# Patient Record
Sex: Male | Born: 1935 | Race: White | Hispanic: No | Marital: Married | Smoking: Never smoker
Health system: Southern US, Community
[De-identification: ages and names within clinical notes are randomized; demographics above are authoritative.]

## PROBLEM LIST (undated history)

## (undated) DIAGNOSIS — E7800 Pure hypercholesterolemia, unspecified: Secondary | ICD-10-CM

## (undated) DIAGNOSIS — R739 Hyperglycemia, unspecified: Secondary | ICD-10-CM

## (undated) DIAGNOSIS — C801 Malignant (primary) neoplasm, unspecified: Secondary | ICD-10-CM

## (undated) DIAGNOSIS — I480 Paroxysmal atrial fibrillation: Secondary | ICD-10-CM

## (undated) DIAGNOSIS — Z950 Presence of cardiac pacemaker: Secondary | ICD-10-CM

## (undated) DIAGNOSIS — I10 Essential (primary) hypertension: Secondary | ICD-10-CM

## (undated) DIAGNOSIS — I442 Atrioventricular block, complete: Secondary | ICD-10-CM

## (undated) DIAGNOSIS — H9190 Unspecified hearing loss, unspecified ear: Secondary | ICD-10-CM

## (undated) DIAGNOSIS — I2584 Coronary atherosclerosis due to calcified coronary lesion: Secondary | ICD-10-CM

## (undated) DIAGNOSIS — I2510 Atherosclerotic heart disease of native coronary artery without angina pectoris: Secondary | ICD-10-CM

## (undated) HISTORY — PX: INGUINAL HERNIA REPAIR: SHX194

## (undated) HISTORY — DX: Paroxysmal atrial fibrillation: I48.0

## (undated) HISTORY — PX: PACEMAKER INSERTION: SHX728

---

## 2004-01-01 ENCOUNTER — Ambulatory Visit (HOSPITAL_COMMUNITY): Admission: RE | Admit: 2004-01-01 | Discharge: 2004-01-01 | Payer: Self-pay | Admitting: Cardiovascular Disease

## 2011-09-09 ENCOUNTER — Other Ambulatory Visit: Payer: Self-pay | Admitting: Cardiovascular Disease

## 2011-09-09 ENCOUNTER — Encounter (HOSPITAL_COMMUNITY): Payer: Self-pay | Admitting: Pharmacy Technician

## 2011-09-16 ENCOUNTER — Other Ambulatory Visit: Payer: Self-pay | Admitting: Cardiovascular Disease

## 2011-09-19 ENCOUNTER — Ambulatory Visit (HOSPITAL_COMMUNITY)
Admission: RE | Admit: 2011-09-19 | Discharge: 2011-09-19 | Disposition: A | Discharge status: Home / Self Care | Payer: Medicare Other | Source: Ambulatory Visit | Attending: Cardiovascular Disease | Admitting: Cardiovascular Disease

## 2011-09-19 ENCOUNTER — Ambulatory Visit (HOSPITAL_COMMUNITY): Payer: Medicare Other

## 2011-09-19 ENCOUNTER — Encounter (HOSPITAL_COMMUNITY)
Admission: RE | Disposition: A | Discharge status: Home / Self Care | Payer: Self-pay | Source: Ambulatory Visit | Attending: Cardiovascular Disease

## 2011-09-19 DIAGNOSIS — Z45018 Encounter for adjustment and management of other part of cardiac pacemaker: Secondary | ICD-10-CM | POA: Insufficient documentation

## 2011-09-19 DIAGNOSIS — Z45010 Encounter for checking and testing of cardiac pacemaker pulse generator [battery]: Secondary | ICD-10-CM

## 2011-09-19 DIAGNOSIS — I442 Atrioventricular block, complete: Secondary | ICD-10-CM

## 2011-09-19 HISTORY — PX: PACEMAKER GENERATOR CHANGE: SHX5481

## 2011-09-19 HISTORY — DX: Atrioventricular block, complete: I44.2

## 2011-09-19 LAB — GLUCOSE, CAPILLARY
Glucose-Capillary: 122 mg/dL — ABNORMAL HIGH (ref 70–99)
Glucose-Capillary: 108 mg/dL — ABNORMAL HIGH (ref 70–99)

## 2011-09-19 SURGERY — PACEMAKER GENERATOR CHANGE
Anesthesia: LOCAL

## 2011-09-19 MED ORDER — SODIUM CHLORIDE 0.9 % IV SOLN: 250.0000 mL | INTRAVENOUS | Status: DC | PRN

## 2011-09-19 MED ORDER — ONDANSETRON HCL 4 MG/2ML IJ SOLN: 4.0000 mg | Freq: Four times a day (QID) | INTRAMUSCULAR | Status: DC | PRN

## 2011-09-19 MED ORDER — CHLORHEXIDINE GLUCONATE 4 % EX LIQD: 60.0000 mL | Freq: Once | CUTANEOUS | Status: DC

## 2011-09-19 MED ORDER — SODIUM CHLORIDE 0.9 % IR SOLN: 80.0000 mg | Status: DC

## 2011-09-19 MED ORDER — ACETAMINOPHEN 325 MG PO TABS: 325.0000 mg | ORAL_TABLET | ORAL | Status: DC | PRN

## 2011-09-19 MED ORDER — CEFAZOLIN SODIUM 1-5 GM-% IV SOLN
INTRAVENOUS | Status: AC
  Filled 2011-09-19: qty 50

## 2011-09-19 MED ORDER — SODIUM CHLORIDE 0.9 % IJ SOLN: 3.0000 mL | INTRAMUSCULAR | Status: DC | PRN

## 2011-09-19 MED ORDER — ACETAMINOPHEN 500 MG PO TABS: 1000.0000 mg | ORAL_TABLET | Freq: Four times a day (QID) | ORAL | Status: DC

## 2011-09-19 MED ORDER — MUPIROCIN 2 % EX OINT
TOPICAL_OINTMENT | Freq: Two times a day (BID) | CUTANEOUS | Status: DC
  Administered 2011-09-19: 1 via NASAL

## 2011-09-19 MED ORDER — HEPARIN (PORCINE) IN NACL 2-0.9 UNIT/ML-% IJ SOLN
INTRAMUSCULAR | Status: AC
  Filled 2011-09-19: qty 1000

## 2011-09-19 MED ORDER — FENTANYL CITRATE 0.05 MG/ML IJ SOLN
INTRAMUSCULAR | Status: AC
  Filled 2011-09-19: qty 2

## 2011-09-19 MED ORDER — LIDOCAINE HCL (PF) 1 % IJ SOLN
INTRAMUSCULAR | Status: AC
  Filled 2011-09-19: qty 60

## 2011-09-19 MED ORDER — MUPIROCIN 2 % EX OINT
TOPICAL_OINTMENT | CUTANEOUS | Status: AC
  Administered 2011-09-19: 1 via NASAL
  Filled 2011-09-19: qty 22

## 2011-09-19 MED ORDER — SODIUM CHLORIDE 0.9 % IJ SOLN: 3.0000 mL | Freq: Two times a day (BID) | INTRAMUSCULAR | Status: DC

## 2011-09-19 MED ORDER — MIDAZOLAM HCL 2 MG/2ML IJ SOLN
INTRAMUSCULAR | Status: AC
  Filled 2011-09-19: qty 2

## 2011-09-19 MED ORDER — SODIUM CHLORIDE 0.9 % IV SOLN: INTRAVENOUS | Status: DC

## 2011-09-19 MED ORDER — SODIUM CHLORIDE 0.9 % IV SOLN
INTRAVENOUS | Status: DC
  Administered 2011-09-19: 08:00:00 via INTRAVENOUS

## 2011-09-19 NOTE — Progress Notes (Signed)
Pt admitted to C1. Left chest dressing CDI. Bedrest/ activity order explained to pt. Pt eating a sandwich and drink now. Family at bedside.

## 2011-09-19 NOTE — Procedures (Signed)
Procedure report  Procedure performed:  1. Dual chamber pacemaker generator changeout  2. Light sedation  Reason for procedure:  1. Device generator at elective replacement interval  Procedure performed by:  Thurmon Fair, MD  Complications:  None  Estimated blood loss:  <5 mL  Medications administered during procedure:  Ancef 1 g intravenously,  lidocaine 1% 30 mL locally, fentanyl 25 mcg intravenously, Versed 1 mg intravenously Device details:   Financial planner DR RF model number PM 2210, serial number serial number [id number] Right atrial lead (chronic) Biotronik, model number JP 45 BP, serial [id number] (implanted April 06, 1992) Right ventricular lead (chronic)  [employer], model number 1216 T., serial number [id number] (implanted 04/06/1992)   Explanted generator [employer],  model number [id number], serial number  [id number] (implanted 01/01/2004)  Procedure details:  After the risks and benefits of the procedure were discussed the patient provided informed consent. She was brought to the cardiac catheter lab in the fasting state. The patient was prepped and draped in usual sterile fashion. Local anesthesia with 1% lidocaine was administered to to the left infraclavicular area. A 5-6cm horizontal incision was made parallel with and 2-3 cm caudal to the left clavicle, in the area of an old scar. An older scar was seen closer to the left clavicle. Using  sharp and blunt dissection the prepectoral pocket was opened carefully to avoid injury to the loops of chronic leads. Extensive dissection was necessary, especially since the new generator was slightly larger. The old device was explanted. The pocket was carefully inspected for hemostasis and flushed with copious amounts of antibiotic solution.  The leads were disconnected from the old generator and testing of the lead parameters showed excellent values. The new generator was connected to the chronic leads, with  appropriate pacing noted. The patient was noted to be completely pacemaker dependent without a ventricular escape rhythm  The entire system was then carefully inserted in the pocket with care been taking that the leads and device assumed a comfortable position without pressure on the incision. Great care was taken that the leads be located deep to the generator. The pocket was then closed in layers using 2 layers of 2-0 Vicryl after which a sterile dressing was applied.   At the end of the procedure the following lead parameters were encountered:   Right atrial lead sensed P waves 2.6 mV, impedance 350 ohms, threshold 1 V at 0.5 ms pulse width.  Right ventricular lead sensed R waves  none, impedance 580 ohms, threshold 1.75 at 0.5 ms pulse width (bipolar configuration).

## 2011-09-19 NOTE — Progress Notes (Signed)
   History reviewed, patient examined, no change in status, stable for surgery.  

## 2012-10-22 ENCOUNTER — Other Ambulatory Visit (HOSPITAL_COMMUNITY): Payer: Self-pay | Admitting: Cardiovascular Disease

## 2012-10-22 DIAGNOSIS — I447 Left bundle-branch block, unspecified: Secondary | ICD-10-CM

## 2012-10-22 DIAGNOSIS — R0989 Other specified symptoms and signs involving the circulatory and respiratory systems: Secondary | ICD-10-CM

## 2012-10-22 DIAGNOSIS — Z950 Presence of cardiac pacemaker: Secondary | ICD-10-CM

## 2012-10-24 ENCOUNTER — Emergency Department (HOSPITAL_COMMUNITY)
Admission: EM | Admit: 2012-10-24 | Discharge: 2012-10-25 | Disposition: A | Discharge status: Home / Self Care | Payer: Medicare Other | Attending: Emergency Medicine | Admitting: Emergency Medicine

## 2012-10-24 ENCOUNTER — Emergency Department (HOSPITAL_COMMUNITY): Payer: Medicare Other

## 2012-10-24 ENCOUNTER — Encounter (HOSPITAL_COMMUNITY): Payer: Self-pay | Admitting: Emergency Medicine

## 2012-10-24 DIAGNOSIS — E7800 Pure hypercholesterolemia, unspecified: Secondary | ICD-10-CM | POA: Insufficient documentation

## 2012-10-24 DIAGNOSIS — R002 Palpitations: Secondary | ICD-10-CM | POA: Insufficient documentation

## 2012-10-24 DIAGNOSIS — Z950 Presence of cardiac pacemaker: Secondary | ICD-10-CM | POA: Insufficient documentation

## 2012-10-24 DIAGNOSIS — R0600 Dyspnea, unspecified: Secondary | ICD-10-CM

## 2012-10-24 DIAGNOSIS — Z7982 Long term (current) use of aspirin: Secondary | ICD-10-CM | POA: Insufficient documentation

## 2012-10-24 DIAGNOSIS — R0602 Shortness of breath: Secondary | ICD-10-CM | POA: Insufficient documentation

## 2012-10-24 DIAGNOSIS — R0609 Other forms of dyspnea: Secondary | ICD-10-CM | POA: Insufficient documentation

## 2012-10-24 DIAGNOSIS — I10 Essential (primary) hypertension: Secondary | ICD-10-CM | POA: Insufficient documentation

## 2012-10-24 DIAGNOSIS — R0989 Other specified symptoms and signs involving the circulatory and respiratory systems: Secondary | ICD-10-CM | POA: Insufficient documentation

## 2012-10-24 DIAGNOSIS — Z8679 Personal history of other diseases of the circulatory system: Secondary | ICD-10-CM | POA: Insufficient documentation

## 2012-10-24 DIAGNOSIS — Z79899 Other long term (current) drug therapy: Secondary | ICD-10-CM | POA: Insufficient documentation

## 2012-10-24 HISTORY — DX: Pure hypercholesterolemia, unspecified: E78.00

## 2012-10-24 HISTORY — DX: Essential (primary) hypertension: I10

## 2012-10-24 LAB — POCT I-STAT TROPONIN I

## 2012-10-24 LAB — BASIC METABOLIC PANEL
BUN: 21 mg/dL (ref 6–23)
CO2: 28 mEq/L (ref 19–32)
Chloride: 98 mEq/L (ref 96–112)
Glucose, Bld: 142 mg/dL — ABNORMAL HIGH (ref 70–99)
Potassium: 4.4 mEq/L (ref 3.5–5.1)
Sodium: 135 mEq/L (ref 135–145)

## 2012-10-24 LAB — CBC
HCT: 41.4 % (ref 39.0–52.0)
Hemoglobin: 14.3 g/dL (ref 13.0–17.0)
MCHC: 34.5 g/dL (ref 30.0–36.0)
RBC: 4.67 MIL/uL (ref 4.22–5.81)
WBC: 9.2 10*3/uL (ref 4.0–10.5)

## 2012-10-24 NOTE — ED Notes (Addendum)
C/o "heart pounding hard",  L sided chest tightness, lightheadedness, and mild sob since yesterday.  States he had pacemaker reprogrammed on Friday at Cardiologist office.  States heart pounding feels the same as on Friday when he had to have pacemaker reprogrammed.

## 2012-10-25 NOTE — ED Notes (Signed)
Pacemaker interrogated at this time.

## 2012-10-25 NOTE — ED Provider Notes (Signed)
History     CSN: [id number]  Arrival date & time 10/24/12  2026   First MD Initiated Contact with Patient 10/24/12 2300      Chief Complaint  Patient presents with  . Chest Pain    (Consider location/radiation/quality/duration/timing/severity/associated sxs/prior treatment) HPI 79 yo male presents to the ER from home with complaint of pounding heart rate, palpitations with sensation of fast heart rate, mild shortness of breath. He denies chest pressure, only the sensation of pressure with the hard heartbeat. He denies chest pain Shortness of breath is worse when sitting still. No fevers no chills. Patient had similar symptoms earlier in the week, saw his cardiologist in the office who adjusted his pacemaker. Patient felt well until yesterday when symptoms started up again. Patient has history of complete heart block, and has pacemaker for many years. Recently replaced in November.  Past Medical History  Diagnosis Date  . High cholesterol   . Hypertension     Past Surgical History  Procedure Date  . Pacemaker insertion     No family history on file.  History  Substance Use Topics  . Smoking status: Never Smoker   . Smokeless tobacco: Not on file  . Alcohol Use: No      Review of Systems  See History of Present Illness; otherwise all other systems are reviewed and negative  Allergies  Review of patient's allergies indicates no known allergies.  Home Medications   Current Outpatient Rx  Name  Route  Sig  Dispense  Refill  . ASPIRIN EC 81 MG PO TBEC   Oral   Take 81 mg by mouth daily.         . ATORVASTATIN CALCIUM 20 MG PO TABS   Oral   Take 20 mg by mouth daily.           Marland Kitchen BENAZEPRIL HCL 20 MG PO TABS   Oral   Take 10 mg by mouth daily.           Marland Kitchen METOPROLOL TARTRATE 50 MG PO TABS   Oral   Take 75 mg by mouth 2 (two) times daily.           Marland Kitchen NIACIN ER 500 MG PO TBCR   Oral   Take 1,000 mg by mouth at bedtime.         Marland Kitchen FISH OIL 1200 MG PO  CAPS   Oral   Take 2,400 mg by mouth daily.           BP 141/62  Pulse 70  Temp 98.2 F (36.8 C) (Oral)  Resp 14  Ht 5\' 8"  (1.727 m)  Wt 146 lb (66.225 kg)  BMI 22.20 kg/m2  SpO2 100%  Physical Exam  Nursing note and vitals reviewed. Constitutional: He is oriented to person, place, and time. He appears well-developed and well-nourished.  HENT:  Head: Normocephalic and atraumatic.  Nose: Nose normal.  Mouth/Throat: Oropharynx is clear and moist.  Eyes: Conjunctivae normal and EOM are normal. Pupils are equal, round, and reactive to light.  Neck: Normal range of motion. Neck supple. No JVD present. No tracheal deviation present. No thyromegaly present.  Cardiovascular: Normal rate, regular rhythm and intact distal pulses.  Exam reveals no gallop and no friction rub.   Murmur heard. Pulmonary/Chest: Effort normal and breath sounds normal. No stridor. No respiratory distress. He has no wheezes. He has no rales. He exhibits no tenderness.  Abdominal: Soft. Bowel sounds are normal. He exhibits no distension and  no mass. There is no tenderness. There is no rebound and no guarding.  Musculoskeletal: Normal range of motion. He exhibits no edema and no tenderness.  Lymphadenopathy:    He has no cervical adenopathy.  Neurological: He is alert and oriented to person, place, and time. No cranial nerve deficit. He exhibits normal muscle tone. Coordination normal.  Skin: Skin is warm and dry. No rash noted. No erythema. No pallor.  Psychiatric: He has a normal mood and affect. His behavior is normal. Judgment and thought content normal.    ED Course  Procedures (including critical care time)  Labs Reviewed  BASIC METABOLIC PANEL - Abnormal; Notable for the following:    Glucose, Bld 142 (*)     GFR calc non Af Amer 81 (*)     All other components within normal limits  CBC  POCT I-STAT TROPONIN I   Dg Chest 2 View  10/24/2012  *RADIOLOGY REPORT*  Clinical Data: Chest pressure.   Shortness of breath.  CHEST - 2 VIEW  Comparison: None.  Findings: Cardiomegaly.  Dual lead pacemaker good position with atrial and ventricular leads.   Clear lung fields.  Exaggerated kyphosis with osteopenia.  No effusion or pneumothorax. Degenerative change both shoulders.  IMPRESSION: Cardiomegaly.  No active cardiopulmonary disease.   Original Report Authenticated By: Davonna Belling, M.D.     Date: 10/24/2012  Rate: 72  Rhythm: paced  QRS Axis: left  Intervals: paced  ST/T Wave abnormalities: paced  Conduction Disutrbances:paced  Narrative Interpretation:   Old EKG Reviewed: unchanged    1. Palpitations   2. Dyspnea   3. Pacemaker       MDM  79 year old male with the sensation of palpitations and hard heart beat. Pacemaker interrogated by a [employer] representative. No significant findings found. She has made some slight adjustments, decrease the rate. Discussed case with on-call [facility] cardiologist who recommends followup in the office tomorrow.        Olivia Mackie, MD 10/25/12 [phone]

## 2012-10-25 NOTE — ED Notes (Signed)
The patient is AOx4 and comfortable with his discharge instructions. 

## 2012-10-27 ENCOUNTER — Ambulatory Visit (HOSPITAL_COMMUNITY)
Admission: RE | Admit: 2012-10-27 | Discharge: 2012-10-27 | Disposition: A | Discharge status: Home / Self Care | Payer: Medicare Other | Source: Ambulatory Visit | Attending: Cardiovascular Disease | Admitting: Cardiovascular Disease

## 2012-10-27 DIAGNOSIS — I359 Nonrheumatic aortic valve disorder, unspecified: Secondary | ICD-10-CM | POA: Insufficient documentation

## 2012-10-27 DIAGNOSIS — I447 Left bundle-branch block, unspecified: Secondary | ICD-10-CM | POA: Insufficient documentation

## 2012-10-27 DIAGNOSIS — Z950 Presence of cardiac pacemaker: Secondary | ICD-10-CM

## 2012-10-27 DIAGNOSIS — I369 Nonrheumatic tricuspid valve disorder, unspecified: Secondary | ICD-10-CM | POA: Insufficient documentation

## 2012-10-27 DIAGNOSIS — I379 Nonrheumatic pulmonary valve disorder, unspecified: Secondary | ICD-10-CM | POA: Insufficient documentation

## 2012-10-27 NOTE — Progress Notes (Signed)
2D Echo Performed 10/27/2012    Breyson Kelm, RCS  

## 2012-11-18 ENCOUNTER — Encounter (HOSPITAL_COMMUNITY): Payer: Medicare Other

## 2012-11-25 ENCOUNTER — Ambulatory Visit (HOSPITAL_COMMUNITY)
Admission: RE | Admit: 2012-11-25 | Discharge: 2012-11-25 | Disposition: A | Discharge status: Home / Self Care | Payer: Medicare Other | Source: Ambulatory Visit | Attending: Cardiovascular Disease | Admitting: Cardiovascular Disease

## 2012-11-25 DIAGNOSIS — R0989 Other specified symptoms and signs involving the circulatory and respiratory systems: Secondary | ICD-10-CM | POA: Insufficient documentation

## 2012-11-25 NOTE — Progress Notes (Signed)
Carotid Duplex Complete Robert Cuevas 

## 2012-11-26 NOTE — Progress Notes (Signed)
Carotid Duplex Completed. 

## 2013-02-22 ENCOUNTER — Ambulatory Visit (INDEPENDENT_AMBULATORY_CARE_PROVIDER_SITE_OTHER): Payer: Medicare Other | Admitting: Internal Medicine

## 2013-02-22 ENCOUNTER — Encounter: Payer: Self-pay | Admitting: Internal Medicine

## 2013-02-22 VITALS — BP 137/47 | HR 70 | Ht 68.0 in | Wt 142.1 lb

## 2013-02-22 DIAGNOSIS — I442 Atrioventricular block, complete: Secondary | ICD-10-CM

## 2013-02-22 DIAGNOSIS — T827XXA Infection and inflammatory reaction due to other cardiac and vascular devices, implants and grafts, initial encounter: Secondary | ICD-10-CM

## 2013-02-22 HISTORY — DX: Infection and inflammatory reaction due to other cardiac and vascular devices, implants and grafts, initial encounter: T82.7XXA

## 2013-02-22 LAB — CBC WITH DIFFERENTIAL/PLATELET
Basophils Absolute: 0 10*3/uL (ref 0.0–0.1)
Basophils Relative: 0.4 % (ref 0.0–3.0)
Eosinophils Absolute: 0.1 10*3/uL (ref 0.0–0.7)
HCT: 38.6 % — ABNORMAL LOW (ref 39.0–52.0)
Hemoglobin: 13.4 g/dL (ref 13.0–17.0)
Lymphocytes Relative: 19.8 % (ref 12.0–46.0)
Lymphs Abs: 1.8 10*3/uL (ref 0.7–4.0)
MCHC: 34.7 g/dL (ref 30.0–36.0)
Monocytes Relative: 16.1 % — ABNORMAL HIGH (ref 3.0–12.0)
Neutro Abs: 5.5 10*3/uL (ref 1.4–7.7)
RBC: 4.32 Mil/uL (ref 4.22–5.81)
RDW: 13.7 % (ref 11.5–14.6)

## 2013-02-22 LAB — BASIC METABOLIC PANEL
CO2: 27 mEq/L (ref 19–32)
Calcium: 9 mg/dL (ref 8.4–10.5)
Glucose, Bld: 151 mg/dL — ABNORMAL HIGH (ref 70–99)
Potassium: 3.9 mEq/L (ref 3.5–5.1)
Sodium: 133 mEq/L — ABNORMAL LOW (ref 135–145)

## 2013-02-22 LAB — PACEMAKER DEVICE OBSERVATION
AL AMPLITUDE: 1.4 mv
AL IMPEDENCE PM: 375 Ohm
ATRIAL PACING PM: 80
RV LEAD IMPEDENCE PM: 562.5 Ohm
VENTRICULAR PACING PM: 99

## 2013-02-22 NOTE — Progress Notes (Signed)
HPI Mr. Robert Cuevas is referred today by Dr.Croitoru for evaluation of a pacemaker pocket infection. The patient is a very pleasant 79 year old man with a history of complete heart block, status post permanent pacemaker insertion. He underwent pacemaker generator change several months ago, and subsequently developed thinning, erythema, and eventual extrusion of his pacemaker lead from his pacemaker pocket. He denies fevers or chills. Overall he feels well. He denies chest pain, shortness of breath, syncope, and has had only a bit of drainage from his pacemaker pocket. No Known Allergies   Current Outpatient Prescriptions  Medication Sig Dispense Refill  . aspirin EC 81 MG tablet Take 81 mg by mouth daily.      Marland Kitchen atorvastatin (LIPITOR) 20 MG tablet Take 20 mg by mouth daily.        . benazepril (LOTENSIN) 20 MG tablet Take 10 mg by mouth daily.        . metoprolol (LOPRESSOR) 50 MG tablet Take 75 mg by mouth 2 (two) times daily.        . niacin (SLO-NIACIN) 500 MG tablet Take 1,000 mg by mouth at bedtime.      . Omega-3 Fatty Acids (FISH OIL) 1200 MG CAPS Take 2,400 mg by mouth daily.       No current facility-administered medications for this visit.     Past Medical History  Diagnosis Date  . High cholesterol   . Hypertension     ROS:   All systems reviewed and negative except as noted in the HPI.   Past Surgical History  Procedure Laterality Date  . Pacemaker insertion       No family history on file.   History   Social History  . Marital Status: Married    Spouse Name: N/A    Number of Children: N/A  . Years of Education: N/A   Occupational History  . Not on file.   Social History Main Topics  . Smoking status: Never Smoker   . Smokeless tobacco: Not on file  . Alcohol Use: No  . Drug Use: No  . Sexually Active:    Other Topics Concern  . Not on file   Social History Narrative  . No narrative on file     BP 137/47  Pulse 70  Ht 5\' 8"  (1.727 m)  Wt 142  lb 1.9 oz (64.465 kg)  BMI 21.61 kg/m2  Physical Exam:  Well appearing elderly man,NAD HEENT: Unremarkable Neck:  6 cm JVD, no thyromegally Lungs:  Clear with no wheezes, rales, or rhonchi. Pacemaker incision demonstrates a pacemaker lead through the skin with minimal surrounding erythema. HEART:  Regular rate rhythm, no murmurs, no rubs, no clicks Abd:  soft, positive bowel sounds, no organomegally, no rebound, no guarding Ext:  2 plus pulses, no edema, no cyanosis, no clubbing Skin:  No rashes no nodules Neuro:  CN II through XII intact, motor grossly intact  EKG - normal sinus rhythm with P. Synchronous ventricular pacing.  DEVICE  Normal device function.  See PaceArt for details.   Assess/Plan:

## 2013-02-22 NOTE — Assessment & Plan Note (Signed)
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 There is no way to heal this without total removal of his pacing system. I've discussed the treatment options in detail with the patient. The risk, goals, benefits, and expectations of extraction of his 79 year old pacing leads and pacemaker generator have been discussed in detail. Because he has complete heart block, he will require a temporary permanent pacemaker to be inserted. I would anticipate reimplantation of a new system on the contralateral side several days after his procedure. This will be scheduled in the next several days. The patient is anxious to get this behind him. I specifically discussed the risk of possible perforation in these very old pacing leads. Of course the procedure will be performed in the operating room with cardiovascular back up.

## 2013-02-22 NOTE — Patient Instructions (Addendum)
We will call you with a date and instructions

## 2013-02-24 ENCOUNTER — Encounter (HOSPITAL_COMMUNITY): Payer: Self-pay | Admitting: Pharmacy Technician

## 2013-02-25 ENCOUNTER — Telehealth: Payer: Self-pay | Admitting: *Deleted

## 2013-02-25 ENCOUNTER — Other Ambulatory Visit: Payer: Self-pay | Admitting: *Deleted

## 2013-02-25 DIAGNOSIS — I442 Atrioventricular block, complete: Secondary | ICD-10-CM

## 2013-02-25 DIAGNOSIS — T827XXD Infection and inflammatory reaction due to other cardiac and vascular devices, implants and grafts, subsequent encounter: Secondary | ICD-10-CM

## 2013-02-25 NOTE — Telephone Encounter (Signed)
Follow up   ° ° °Patient wife calling back to speak with nurse. °

## 2013-02-25 NOTE — Telephone Encounter (Signed)
Number busy

## 2013-02-25 NOTE — Telephone Encounter (Signed)
Ryan from [place] called back and confirmed extraction date of 5/14 - Tried to call pt with no answer. He needs to be told to be at the hospital at 11 am on the 14th at the [facility] entrance. Nothing to eat or drink after midnight, bring ins cards and med list with him. Hold his meds in the AM .

## 2013-02-28 NOTE — Telephone Encounter (Signed)
Spoke with Ms Rufener, she knows to be at the hospital at 11:00 am and nothing to eat or drink after midnight the night before.

## 2013-03-01 ENCOUNTER — Encounter (HOSPITAL_COMMUNITY): Payer: Self-pay | Admitting: *Deleted

## 2013-03-01 MED ORDER — SODIUM CHLORIDE 0.9 % IR SOLN
80.0000 mg | Status: DC
  Filled 2013-03-01: qty 2

## 2013-03-01 MED ORDER — CEFAZOLIN SODIUM-DEXTROSE 2-3 GM-% IV SOLR
2.0000 g | INTRAVENOUS | Status: AC
  Administered 2013-03-02: 2 g via INTRAVENOUS
  Filled 2013-03-01: qty 50

## 2013-03-02 ENCOUNTER — Encounter (HOSPITAL_COMMUNITY): Payer: Self-pay | Admitting: *Deleted

## 2013-03-02 ENCOUNTER — Inpatient Hospital Stay (HOSPITAL_COMMUNITY)
Admission: RE | Admit: 2013-03-02 | Discharge: 2013-03-07 | DRG: 243 | Disposition: A | Discharge status: Home / Self Care | Payer: Medicare Other | Source: Ambulatory Visit | Attending: Internal Medicine | Admitting: Internal Medicine

## 2013-03-02 ENCOUNTER — Inpatient Hospital Stay (HOSPITAL_COMMUNITY): Payer: Medicare Other

## 2013-03-02 ENCOUNTER — Inpatient Hospital Stay (HOSPITAL_COMMUNITY): Payer: Medicare Other | Admitting: Anesthesiology

## 2013-03-02 ENCOUNTER — Encounter (HOSPITAL_COMMUNITY): Payer: Self-pay | Admitting: Anesthesiology

## 2013-03-02 ENCOUNTER — Encounter (HOSPITAL_COMMUNITY)
Admission: RE | Disposition: A | Discharge status: Home / Self Care | Payer: Self-pay | Source: Ambulatory Visit | Attending: Internal Medicine

## 2013-03-02 DIAGNOSIS — Z85828 Personal history of other malignant neoplasm of skin: Secondary | ICD-10-CM

## 2013-03-02 DIAGNOSIS — T827XXD Infection and inflammatory reaction due to other cardiac and vascular devices, implants and grafts, subsequent encounter: Secondary | ICD-10-CM

## 2013-03-02 DIAGNOSIS — J95811 Postprocedural pneumothorax: Secondary | ICD-10-CM | POA: Diagnosis not present

## 2013-03-02 DIAGNOSIS — I472 Ventricular tachycardia, unspecified: Secondary | ICD-10-CM | POA: Diagnosis not present

## 2013-03-02 DIAGNOSIS — Y9210 Unspecified residential institution as the place of occurrence of the external cause: Secondary | ICD-10-CM | POA: Diagnosis not present

## 2013-03-02 DIAGNOSIS — J9819 Other pulmonary collapse: Secondary | ICD-10-CM | POA: Diagnosis not present

## 2013-03-02 DIAGNOSIS — R000 Tachycardia, unspecified: Secondary | ICD-10-CM

## 2013-03-02 DIAGNOSIS — I4729 Other ventricular tachycardia: Secondary | ICD-10-CM | POA: Diagnosis not present

## 2013-03-02 DIAGNOSIS — T827XXA Infection and inflammatory reaction due to other cardiac and vascular devices, implants and grafts, initial encounter: Secondary | ICD-10-CM

## 2013-03-02 DIAGNOSIS — I959 Hypotension, unspecified: Secondary | ICD-10-CM | POA: Diagnosis not present

## 2013-03-02 DIAGNOSIS — Z950 Presence of cardiac pacemaker: Secondary | ICD-10-CM

## 2013-03-02 DIAGNOSIS — E7800 Pure hypercholesterolemia, unspecified: Secondary | ICD-10-CM | POA: Diagnosis present

## 2013-03-02 DIAGNOSIS — Y831 Surgical operation with implant of artificial internal device as the cause of abnormal reaction of the patient, or of later complication, without mention of misadventure at the time of the procedure: Secondary | ICD-10-CM | POA: Diagnosis present

## 2013-03-02 DIAGNOSIS — R7309 Other abnormal glucose: Secondary | ICD-10-CM | POA: Diagnosis present

## 2013-03-02 DIAGNOSIS — I442 Atrioventricular block, complete: Secondary | ICD-10-CM

## 2013-03-02 DIAGNOSIS — I2510 Atherosclerotic heart disease of native coronary artery without angina pectoris: Secondary | ICD-10-CM | POA: Diagnosis present

## 2013-03-02 DIAGNOSIS — R0902 Hypoxemia: Secondary | ICD-10-CM | POA: Diagnosis not present

## 2013-03-02 DIAGNOSIS — I10 Essential (primary) hypertension: Secondary | ICD-10-CM | POA: Diagnosis present

## 2013-03-02 DIAGNOSIS — H9190 Unspecified hearing loss, unspecified ear: Secondary | ICD-10-CM | POA: Diagnosis present

## 2013-03-02 HISTORY — PX: GENERATOR REMOVAL: SHX5468

## 2013-03-02 HISTORY — DX: Hyperglycemia, unspecified: R73.9

## 2013-03-02 HISTORY — DX: Atherosclerotic heart disease of native coronary artery without angina pectoris: I25.10

## 2013-03-02 HISTORY — DX: Atrioventricular block, complete: I44.2

## 2013-03-02 HISTORY — PX: PACEMAKER LEAD REMOVAL: SHX5064

## 2013-03-02 HISTORY — DX: Coronary atherosclerosis due to calcified coronary lesion: I25.84

## 2013-03-02 HISTORY — PX: ICD LEAD REMOVAL: SHX5855

## 2013-03-02 HISTORY — DX: Malignant (primary) neoplasm, unspecified: C80.1

## 2013-03-02 HISTORY — DX: Presence of cardiac pacemaker: Z95.0

## 2013-03-02 HISTORY — DX: Unspecified hearing loss, unspecified ear: H91.90

## 2013-03-02 LAB — CREATININE, SERUM: GFR calc non Af Amer: 82 mL/min — ABNORMAL LOW (ref >90)

## 2013-03-02 LAB — CBC
HCT: 36.1 % — ABNORMAL LOW (ref 39.0–52.0)
MCV: 87.6 fL (ref 78.0–100.0)
RBC: 4.12 MIL/uL — ABNORMAL LOW (ref 4.22–5.81)
WBC: 12.3 10*3/uL — ABNORMAL HIGH (ref 4.0–10.5)

## 2013-03-02 LAB — SURGICAL PCR SCREEN: Staphylococcus aureus: NEGATIVE

## 2013-03-02 SURGERY — REMOVAL, ELECTRODE LEAD, ICD
Anesthesia: General | Site: Chest | Wound class: Dirty or Infected

## 2013-03-02 MED ORDER — CHLORHEXIDINE GLUCONATE 4 % EX LIQD: 60.0000 mL | Freq: Once | CUTANEOUS | Status: DC

## 2013-03-02 MED ORDER — ATORVASTATIN CALCIUM 20 MG PO TABS
20.0000 mg | ORAL_TABLET | Freq: Every day | ORAL | Status: DC
  Administered 2013-03-02 – 2013-03-06 (×5): 20 mg via ORAL
  Filled 2013-03-02 (×6): qty 1

## 2013-03-02 MED ORDER — HEPARIN SODIUM (PORCINE) 5000 UNIT/ML IJ SOLN
5000.0000 [IU] | Freq: Three times a day (TID) | INTRAMUSCULAR | Status: DC
  Administered 2013-03-02 – 2013-03-03 (×3): 5000 [IU] via SUBCUTANEOUS
  Filled 2013-03-02 (×5): qty 1

## 2013-03-02 MED ORDER — GLYCOPYRROLATE 0.2 MG/ML IJ SOLN
INTRAMUSCULAR | Status: DC | PRN
  Administered 2013-03-02: 0.6 mg via INTRAVENOUS

## 2013-03-02 MED ORDER — PHENYLEPHRINE HCL 10 MG/ML IJ SOLN
10.0000 mg | INTRAVENOUS | Status: DC | PRN
  Administered 2013-03-02: 20 ug/min via INTRAVENOUS

## 2013-03-02 MED ORDER — MUPIROCIN 2 % EX OINT
TOPICAL_OINTMENT | Freq: Two times a day (BID) | CUTANEOUS | Status: DC
  Administered 2013-03-02: 1 via NASAL

## 2013-03-02 MED ORDER — NIACIN ER 500 MG PO CPCR
1000.0000 mg | ORAL_CAPSULE | Freq: Every day | ORAL | Status: DC
  Administered 2013-03-02 – 2013-03-06 (×5): 1000 mg via ORAL
  Filled 2013-03-02 (×6): qty 2

## 2013-03-02 MED ORDER — MUPIROCIN 2 % EX OINT
TOPICAL_OINTMENT | CUTANEOUS | Status: AC
  Filled 2013-03-02: qty 22

## 2013-03-02 MED ORDER — LACTATED RINGERS IV SOLN
INTRAVENOUS | Status: DC | PRN
  Administered 2013-03-02: 14:00:00 via INTRAVENOUS

## 2013-03-02 MED ORDER — HEPARIN (PORCINE) IN NACL 2-0.9 UNIT/ML-% IJ SOLN
INTRAMUSCULAR | Status: DC | PRN
  Administered 2013-03-02: 1 via INTRAVENOUS

## 2013-03-02 MED ORDER — SODIUM CHLORIDE 0.9 % IV SOLN
INTRAVENOUS | Status: DC
  Administered 2013-03-02: 10 mL/h via INTRAVENOUS

## 2013-03-02 MED ORDER — BENAZEPRIL HCL 10 MG PO TABS
10.0000 mg | ORAL_TABLET | Freq: Every day | ORAL | Status: DC
  Administered 2013-03-02: 10 mg via ORAL
  Filled 2013-03-02 (×2): qty 1

## 2013-03-02 MED ORDER — LIDOCAINE HCL (PF) 1 % IJ SOLN
INTRAMUSCULAR | Status: AC
  Filled 2013-03-02: qty 30

## 2013-03-02 MED ORDER — SODIUM CHLORIDE 0.9 % IR SOLN
Freq: Once | Status: DC
  Filled 2013-03-02: qty 2

## 2013-03-02 MED ORDER — METOPROLOL TARTRATE 50 MG PO TABS
75.0000 mg | ORAL_TABLET | Freq: Two times a day (BID) | ORAL | Status: DC
  Administered 2013-03-02: 75 mg via ORAL
  Filled 2013-03-02 (×3): qty 1

## 2013-03-02 MED ORDER — ATORVASTATIN CALCIUM 20 MG PO TABS
20.0000 mg | ORAL_TABLET | Freq: Every day | ORAL | Status: DC
  Filled 2013-03-02: qty 1

## 2013-03-02 MED ORDER — LIDOCAINE HCL (CARDIAC) 20 MG/ML IV SOLN
INTRAVENOUS | Status: DC | PRN
  Administered 2013-03-02: 60 mg via INTRAVENOUS

## 2013-03-02 MED ORDER — NEOSTIGMINE METHYLSULFATE 1 MG/ML IJ SOLN
INTRAMUSCULAR | Status: DC | PRN
  Administered 2013-03-02: 5 mg via INTRAVENOUS

## 2013-03-02 MED ORDER — ONDANSETRON HCL 4 MG/2ML IJ SOLN: 4.0000 mg | Freq: Four times a day (QID) | INTRAMUSCULAR | Status: DC | PRN

## 2013-03-02 MED ORDER — NIACIN ER 500 MG PO TBCR
1000.0000 mg | EXTENDED_RELEASE_TABLET | Freq: Every day | ORAL | Status: DC
  Filled 2013-03-02: qty 2

## 2013-03-02 MED ORDER — ACETAMINOPHEN 325 MG PO TABS
325.0000 mg | ORAL_TABLET | ORAL | Status: DC | PRN
  Administered 2013-03-04 – 2013-03-05 (×2): 650 mg via ORAL
  Filled 2013-03-02 (×2): qty 2

## 2013-03-02 MED ORDER — CEFAZOLIN SODIUM-DEXTROSE 2-3 GM-% IV SOLR
2.0000 g | Freq: Four times a day (QID) | INTRAVENOUS | Status: AC
  Administered 2013-03-02 – 2013-03-03 (×3): 2 g via INTRAVENOUS
  Filled 2013-03-02 (×3): qty 50

## 2013-03-02 MED ORDER — ONDANSETRON HCL 4 MG/2ML IJ SOLN
INTRAMUSCULAR | Status: DC | PRN
  Administered 2013-03-02: 4 mg via INTRAVENOUS

## 2013-03-02 MED ORDER — ROCURONIUM BROMIDE 100 MG/10ML IV SOLN
INTRAVENOUS | Status: DC | PRN
  Administered 2013-03-02: 40 mg via INTRAVENOUS

## 2013-03-02 MED ORDER — FENTANYL CITRATE 0.05 MG/ML IJ SOLN
INTRAMUSCULAR | Status: DC | PRN
  Administered 2013-03-02: 100 ug via INTRAVENOUS

## 2013-03-02 MED ORDER — SODIUM CHLORIDE 0.9 % IR SOLN
Status: DC | PRN
  Administered 2013-03-02: 16:00:00

## 2013-03-02 MED ORDER — PROPOFOL 10 MG/ML IV BOLUS
INTRAVENOUS | Status: DC | PRN
Start: 2013-03-02 — End: 2013-03-02
  Administered 2013-03-02: 130 mg via INTRAVENOUS

## 2013-03-02 MED ORDER — ASPIRIN EC 81 MG PO TBEC
81.0000 mg | DELAYED_RELEASE_TABLET | Freq: Every day | ORAL | Status: DC
  Filled 2013-03-02: qty 1

## 2013-03-02 MED ORDER — LACTATED RINGERS IV SOLN
INTRAVENOUS | Status: DC
  Administered 2013-03-02: 13:00:00 via INTRAVENOUS

## 2013-03-02 MED ORDER — SODIUM CHLORIDE 0.9 % IV SOLN: INTRAVENOUS | Status: DC

## 2013-03-02 MED ORDER — ASPIRIN EC 81 MG PO TBEC
81.0000 mg | DELAYED_RELEASE_TABLET | Freq: Every day | ORAL | Status: DC
  Administered 2013-03-02 – 2013-03-07 (×6): 81 mg via ORAL
  Filled 2013-03-02 (×6): qty 1

## 2013-03-02 SURGICAL SUPPLY — 41 items
BAG BANDED W/RUBBER/TAPE 36X54 (MISCELLANEOUS) ×2 IMPLANT
BLADE STERNUM SYSTEM 6 (BLADE) ×2 IMPLANT
BLADE SURG ROTATE 9660 (MISCELLANEOUS) ×2 IMPLANT
CANISTER SUCTION 2500CC (MISCELLANEOUS) ×2 IMPLANT
CLOTH BEACON ORANGE TIMEOUT ST (SAFETY) ×2 IMPLANT
COVER DOME SNAP 22 D (MISCELLANEOUS) ×2 IMPLANT
COVER TABLE BACK 60X90 (DRAPES) ×2 IMPLANT
DRAPE C-ARM 42X72 X-RAY (DRAPES) ×2 IMPLANT
DRAPE CARDIOVASCULAR INCISE (DRAPES) ×1
DRAPE INCISE IOBAN 66X45 STRL (DRAPES) ×2 IMPLANT
DRAPE PROXIMA HALF (DRAPES) ×4 IMPLANT
DRAPE SRG 135X102X78XABS (DRAPES) ×1 IMPLANT
ELECT REM PT RETURN 9FT ADLT (ELECTROSURGICAL) ×2
ELECTRODE REM PT RTRN 9FT ADLT (ELECTROSURGICAL) ×1 IMPLANT
GAUZE PACKING IODOFORM 1 (PACKING) IMPLANT
GAUZE SPONGE 4X4 16PLY XRAY LF (GAUZE/BANDAGES/DRESSINGS) ×2 IMPLANT
GLOVE BIO SURGEON STRL SZ8 (GLOVE) ×2 IMPLANT
GLOVE BIOGEL PI IND STRL 6.5 (GLOVE) ×1 IMPLANT
GLOVE BIOGEL PI IND STRL 7.0 (GLOVE) ×1 IMPLANT
GLOVE BIOGEL PI IND STRL 7.5 (GLOVE) ×2 IMPLANT
GLOVE BIOGEL PI INDICATOR 6.5 (GLOVE) ×1
GLOVE BIOGEL PI INDICATOR 7.0 (GLOVE) ×1
GLOVE BIOGEL PI INDICATOR 7.5 (GLOVE) ×2
GOWN PREVENTION PLUS XLARGE (GOWN DISPOSABLE) ×2 IMPLANT
GOWN STRL NON-REIN LRG LVL3 (GOWN DISPOSABLE) IMPLANT
GOWN STRL REIN XL XLG (GOWN DISPOSABLE) ×2 IMPLANT
KIT ROOM TURNOVER OR (KITS) ×2 IMPLANT
NEEDLE PERC 18GX7CM (NEEDLE) ×2 IMPLANT
PAD ARMBOARD 7.5X6 YLW CONV (MISCELLANEOUS) ×4 IMPLANT
PENCIL BUTTON HOLSTER BLD 10FT (ELECTRODE) IMPLANT
PROTECTION STATION PRESSURIZED (MISCELLANEOUS) ×2
SPONGE GAUZE 4X4 12PLY (GAUZE/BANDAGES/DRESSINGS) ×2 IMPLANT
STATION PROTECTION PRESSURIZED (MISCELLANEOUS) ×1 IMPLANT
SUT PROLENE 2 0 CT2 30 (SUTURE) ×4 IMPLANT
SUT SILK 0 FSL (SUTURE) ×4 IMPLANT
SUT SILK 1 TIES 10X30 (SUTURE) ×2 IMPLANT
SUT VIC AB 2-0 CT2 18 VCP726D (SUTURE) ×2 IMPLANT
SUT VIC AB 3-0 X1 27 (SUTURE) ×2 IMPLANT
TOWEL OR 17X24 6PK STRL BLUE (TOWEL DISPOSABLE) ×4 IMPLANT
TUBE CONNECTING 12X1/4 (SUCTIONS) ×2 IMPLANT
YANKAUER SUCT BULB TIP NO VENT (SUCTIONS) ×2 IMPLANT

## 2013-03-02 NOTE — Anesthesia Procedure Notes (Signed)
Procedure Name: Intubation Date/Time: 03/02/2013 2:41 PM Performed by: Coralee Rud Pre-anesthesia Checklist: Patient identified, Emergency Drugs available, Suction available and Patient being monitored Patient Re-evaluated:Patient Re-evaluated prior to inductionOxygen Delivery Method: Circle system utilized Preoxygenation: Pre-oxygenation with 100% oxygen Intubation Type: IV induction Ventilation: Mask ventilation without difficulty Laryngoscope Size: Miller and 5 Grade View: Grade I Tube type: Oral Tube size: 8.0 mm Number of attempts: 1 Airway Equipment and Method: Stylet and LTA kit utilized Placement Confirmation: ETT inserted through vocal cords under direct vision,  positive ETCO2 and CO2 detector Secured at: 22 cm Tube secured with: Tape Dental Injury: Teeth and Oropharynx as per pre-operative assessment

## 2013-03-02 NOTE — Transfer of Care (Signed)
Immediate Anesthesia Transfer of Care Note  Patient: Robert Cuevas  Procedure(s) Performed: Procedure(s): ICD LEAD REMOVAL (N/A) PACEMAKER LEAD REMOVAL (N/A) GENERATOR REMOVAL (N/A)  Patient Location: PACU  Anesthesia Type:General  Level of Consciousness: awake, alert  and oriented  Airway & Oxygen Therapy: Patient Spontanous Breathing and Patient connected to face mask oxygen  Post-op Assessment: Report given to PACU RN, Post -op Vital signs reviewed and stable and Patient moving all extremities  Post vital signs: Reviewed and stable  Complications: No apparent anesthesia complications

## 2013-03-02 NOTE — Op Note (Signed)
DDD PM extraction and insertion of a temporary perm PM without immediate complication. [id number].

## 2013-03-02 NOTE — Anesthesia Postprocedure Evaluation (Signed)
Anesthesia Post Note  Patient: Robert Cuevas  Procedure(s) Performed: Procedure(s) (LRB): ICD LEAD REMOVAL (N/A) PACEMAKER LEAD REMOVAL (N/A) GENERATOR REMOVAL (N/A)  Anesthesia type: general  Patient location: PACU  Post pain: Pain level controlled  Post assessment: Patient's Cardiovascular Status Stable  Last Vitals:  Filed Vitals:   03/02/13 1730  BP: 92/48  Pulse: 70  Temp:   Resp:     Post vital signs: Reviewed and stable  Level of consciousness: sedated  Complications: No apparent anesthesia complications

## 2013-03-02 NOTE — Anesthesia Preprocedure Evaluation (Signed)
Anesthesia Evaluation  Patient identified by MRN, date of birth, ID band Patient awake    Reviewed: Allergy & Precautions, H&P , NPO status , Patient's Chart, lab work & pertinent test results, reviewed documented beta blocker date and time   History of Anesthesia Complications Negative for: history of anesthetic complications  Airway Mallampati: II TM Distance: >3 FB Neck ROM: Full    Dental  (+) Teeth Intact and Dental Advisory Given   Pulmonary neg pulmonary ROS,    Pulmonary exam normal       Cardiovascular hypertension, Pt. on home beta blockers + pacemaker     Neuro/Psych negative neurological ROS  negative psych ROS   GI/Hepatic negative GI ROS, Neg liver ROS,   Endo/Other  negative endocrine ROS  Renal/GU negative Renal ROS     Musculoskeletal   Abdominal   Peds  Hematology   Anesthesia Other Findings   Reproductive/Obstetrics                           Anesthesia Physical Anesthesia Plan  ASA: III  Anesthesia Plan: General   Post-op Pain Management:    Induction: Intravenous  Airway Management Planned: Oral ETT  Additional Equipment: Arterial line  Intra-op Plan:   Post-operative Plan: Extubation in OR  Informed Consent: I have reviewed the patients History and Physical, chart, labs and discussed the procedure including the risks, benefits and alternatives for the proposed anesthesia with the patient or authorized representative who has indicated his/her understanding and acceptance.   Dental advisory given  Plan Discussed with: CRNA, Anesthesiologist and Surgeon  Anesthesia Plan Comments:         Anesthesia Quick Evaluation

## 2013-03-03 ENCOUNTER — Inpatient Hospital Stay (HOSPITAL_COMMUNITY): Payer: Medicare Other

## 2013-03-03 DIAGNOSIS — I369 Nonrheumatic tricuspid valve disorder, unspecified: Secondary | ICD-10-CM

## 2013-03-03 DIAGNOSIS — I442 Atrioventricular block, complete: Secondary | ICD-10-CM

## 2013-03-03 LAB — CBC
Platelets: 173 10*3/uL (ref 150–400)
RDW: 13.3 % (ref 11.5–15.5)
WBC: 12.4 10*3/uL — ABNORMAL HIGH (ref 4.0–10.5)

## 2013-03-03 MED ORDER — IOHEXOL 350 MG/ML SOLN
100.0000 mL | Freq: Once | INTRAVENOUS | Status: AC | PRN
  Administered 2013-03-03: 100 mL via INTRAVENOUS

## 2013-03-03 MED ORDER — SODIUM CHLORIDE 0.9 % IV SOLN
Freq: Once | INTRAVENOUS | Status: AC
  Administered 2013-03-03: 16:00:00 via INTRAVENOUS

## 2013-03-03 MED ORDER — CHLORHEXIDINE GLUCONATE 4 % EX LIQD
60.0000 mL | Freq: Once | CUTANEOUS | Status: AC
  Administered 2013-03-04: 4 via TOPICAL
  Filled 2013-03-03: qty 60

## 2013-03-03 MED ORDER — SODIUM CHLORIDE 0.9 % IV SOLN
Freq: Once | INTRAVENOUS | Status: AC
  Administered 2013-03-03: 12:00:00 via INTRAVENOUS

## 2013-03-03 MED ORDER — SODIUM CHLORIDE 0.9 % IV SOLN
INTRAVENOUS | Status: DC
  Administered 2013-03-04: 06:00:00 via INTRAVENOUS

## 2013-03-03 MED ORDER — SODIUM CHLORIDE 0.9 % IV SOLN: INTRAVENOUS | Status: DC

## 2013-03-03 MED ORDER — CHLORHEXIDINE GLUCONATE 4 % EX LIQD
60.0000 mL | Freq: Once | CUTANEOUS | Status: AC
  Administered 2013-03-03: 4 via TOPICAL
  Filled 2013-03-03: qty 60

## 2013-03-03 MED ORDER — SODIUM CHLORIDE 0.9 % IR SOLN
80.0000 mg | Status: DC
  Administered 2013-03-04: 80 mg
  Filled 2013-03-03 (×2): qty 2

## 2013-03-03 MED ORDER — CEFAZOLIN SODIUM-DEXTROSE 2-3 GM-% IV SOLR
2.0000 g | INTRAVENOUS | Status: DC
  Administered 2013-03-04: 2 g via INTRAVENOUS
  Filled 2013-03-03 (×2): qty 50

## 2013-03-03 NOTE — Progress Notes (Signed)
Dr. Ladona Ridgel notified of pt's B/P and medications. Orders received

## 2013-03-03 NOTE — Progress Notes (Signed)
Spoke with Nehemiah Settle, [facility] and new orders received.

## 2013-03-03 NOTE — Progress Notes (Signed)
UR complete.  Kalese Ensz RN, MSN 

## 2013-03-03 NOTE — Progress Notes (Signed)
Removed R Radial A line at 0645. Pt stable and no bleeding at this time. Pressure applied for 10 minutes, pressure dressing applied to site. Education given to pt and pt wife. Will continue to monitor closely.

## 2013-03-03 NOTE — Progress Notes (Signed)
Patient ID: MINOR IDEN, male   DOB: 11-11-1929, 79 y.o.   MRN: [id number] Subjective:  No chest pain or sob.   Objective:  Vital Signs in the last 24 hours: Temp:  [97.5 F (36.4 C)-98.8 F (37.1 C)] 98 F (36.7 C) (05/15 0000) Pulse Rate:  [67-82] 71 (05/15 0700) Resp:  [14-22] 17 (05/15 0700) BP: (80-136)/(37-65) 80/44 mmHg (05/15 0700) SpO2:  [91 %-100 %] 98 % (05/15 0700) Arterial Line BP: (113-119)/(44-46) 119/45 mmHg (05/14 1800)  Intake/Output from previous day: 05/14 0701 - 05/15 0700 In: 1709.2 [I.V.:1559.2; IV Piggyback:150] Out: 735 [Urine:635; Blood:100] Intake/Output from this shift:    Physical Exam: Well appearing NAD HEENT: Unremarkable Neck:  No JVD, no thyromegally Lungs:  Clear with no wheezes. No crepitace. Pocket without hematoma HEART:  Regular rate rhythm, no murmurs, no rubs, no clicks Abd:  Flat, positive bowel sounds, no organomegally, no rebound, no guarding Ext:  2 plus pulses, no edema, no cyanosis, no clubbing Skin:  No rashes no nodules Neuro:  CN II through XII intact, motor grossly intact  Lab Results:  Recent Labs  03/02/13 2204  WBC 12.3*  HGB 12.6*  PLT 175    Recent Labs  03/02/13 2204  CREATININE 0.78   No results found for this basename: TROPONINI, CK, MB,  in the last 72 hours Hepatic Function Panel No results found for this basename: PROT, ALBUMIN, AST, ALT, ALKPHOS, BILITOT, BILIDIR, IBILI,  in the last 72 hours No results found for this basename: CHOL,  in the last 72 hours No results found for this basename: PROTIME,  in the last 72 hours  Imaging: Dg Chest Port 1 View  03/03/2013   *RADIOLOGY REPORT*  Clinical Data: Pneumothorax.  PORTABLE CHEST - 1 VIEW  Comparison: 03/02/2013.  Findings: Tiny amount of lucency is present along the descending thoracic aortic contour projected over the left hilum. This has decreased compared yesterday's exam.  The pacemaker power pack is partially visualized. The cardiopericardial  silhouette appears within normal limits allowing for volumes of inspiration.  Blunting left costophrenic angle may represent atelectasis or a tiny effusion.  Lung volumes are lower than on the prior exam.  IMPRESSION: Tiny left paramediastinal pneumothorax or pneumomediastinum. Interval decrease compared to prior.  Low volume chest.   Original Report Authenticated By: Andreas Newport, M.D.   Dg Chest Portable 1 View  03/02/2013   *RADIOLOGY REPORT*  Clinical Data: Post implant.  Evaluate for pneumothorax.  PORTABLE CHEST - 1 VIEW  Comparison: 10/24/2012.  Findings: Heart size stable.  A single left-sided pacemaker lead tip projects over the right ventricle.  Minimal bibasilar atelectasis.  No pleural fluid.  IMPRESSION: Trace medial left pneumothorax after pacemaker insertion. These results will be called to the ordering clinician or representative by the Radiologist Assistant, and communication documented in the PACS Dashboard.   Original Report Authenticated By: Leanna Battles, M.D.    Cardiac Studies: Tele - nsr Assessment/Plan:  1. PPM pocket infection 2. S/p lead extraction and insertion of a temporary-perm PM 3. Small pneumothorax vs mediastinum Plan - will check CT of chest with contrast to rule out vascular damage. He looks well but blood pressure on low side. If all ok, would plan new PM implant tomorrow.  LOS: 1 day    Zakyria Metzinger,M.D. 03/03/2013, 8:11 AM

## 2013-03-03 NOTE — Op Note (Signed)
NAMEBERTRAM, HADDIX NO.:  [id number]  MEDICAL RECORD NO.:  [id number]  LOCATION:  2927                         FACILITY:  [facility]  PHYSICIAN:  Doylene Canning. Ladona Ridgel, MD    DATE OF BIRTH:  [birth year]  DATE OF PROCEDURE:  03/02/2013 DATE OF DISCHARGE:                              OPERATIVE REPORT   PROCEDURE PERFORMED:  Insertion of a temporary permanent transvenous pacemaker by way of the left internal jugular vein followed by extraction of a dual-chamber pacing system due to infection.  INTRODUCTION:  The patient is a very pleasant 79 year old man with longstanding complete heart block, status post his initial pacemaker insertion 21 years ago.  He underwent pacemaker generator change out several months ago.  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  There were no fevers or chills.  He had minimal drainage. The lead is clearly eroded, however.  He is now referred for removal of his old device and insertion of a temporary permanent device.  Of note, the patient has complete heart block, and no escape.  PROCEDURE:  After informed consent was obtained, the patient was taken to the operating room in the fasting state.  Anesthesia was applied by the Anesthesia service.  The left internal jugular vein was punctured and the St. Jude temporary permanent 58 cm, transvenous pacing lead was advanced under fluoroscopic guidance into the right ventricle.  The lead was actively fixed.  The R-waves were greater than 10.  The pacing impedance was approximately 600 ohms and threshold was less than a V at 0.5 milliseconds.  10 V pacing not stimulate the diaphragm.  With these satisfactory parameters, attention was then turned for removal of the 68- year-old pacing leads.  A 5-cm incision was carried out over the left pectoral region.  Electrocautery was utilized to dissect down and dissect out the  pacemaker leads.  The generator was removed with gentle traction.  Care was taken not to disconnect the RV lead for pacemaker backup.  The atrial lead was targeted 1st for extraction. Electrocautery was utilized to free up the atrial lead.  The sewing sleeve was removed.  A 52-cm stylet was advanced into the atrial lead all the way down to the tip.  At this point, the lead was cut.  A liberator locking stylet was then advanced into the atrial pacing lead to the tip of the lead.  The locking stylet was secured.  An Adriana Simas 11- [place] RL shortie dissection sheath rather was advanced over the locking stylet and over the lead and into the subclavian vein.  The shortie was removed and the longer Cook RL sheath was advanced into the subclavian vein.  Maintaining a coaxial direction of applied force, a combination of tension and counter pressure were placed on the lead utilizing the liberator locking stylet for traction and the Cook RL sheath to cut the binding sites.  After several minutes of pulling and pushing, the atrial lead was removed in total without immediate procedural complication.  At this point, the right ventricular lead was freed up from its fibrous binding sites and a 58-cm stylet was  advanced into the lead.  At this point, the lead was cut and the liberator locking stylet was advanced into the lead locking the tip of the lead.  A silk suture was placed in the proximal portion of the lead.  At this point, the [facility] RL shortie sheath was advanced into the subclavian vein.  The lead began to withdraw from the right ventricle, into the right atrium and then on into the superior vena cava.  The lead became stuck and fixed at the subclavian vein site where there was very dense fibrous binding sites. Utilizing a combination of counter pressure, traction and counter traction on the lead, the lead was ultimately removed in total without hemodynamic sequelae.  At this point, the pocket was  evaluated. Hemostasis was obtained.  The pocket was debrided from its fibrous tissue.  Electrocautery was utilized to assure hemostasis.  The previously implanted temporary permanent pacing lead was secured to the skin and hooked up to a older pacemaker generator and a bandage was placed over this to secure it.  The pocket was again irrigated and the pocket incision was closed with 2-0 Prolene suture.  A pressure dressing was applied and the patient was returned to recovery area in satisfactory condition.  COMPLICATIONS:  There were no immediate procedure complications.  RESULTS:  Demonstrate successful insertion of a temporary permanent transvenous pacing system, followed by extraction of a 77 year old dual- chamber pacing lead and a pacemaker generator, all without immediate procedure complications.     Doylene Canning. Ladona Ridgel, MD     GWT/MEDQ  D:  03/02/2013  T:  03/03/2013  Job:  [id number]  cc:   Thurmon Fair, MD

## 2013-03-03 NOTE — Progress Notes (Signed)
Echocardiogram 2D Echocardiogram limited  has been performed.  Robert Cuevas 03/03/2013, 2:43 PM

## 2013-03-03 NOTE — Progress Notes (Signed)
Called Kief, [facility] to report continued low blood pressure of 82/40 after a 500 cc fluid bolus.  She states she will speak to Dr. Ladona Ridgel and call me back.  Pt is asymptomatic with the blood pressure.

## 2013-03-04 ENCOUNTER — Encounter (HOSPITAL_COMMUNITY): Payer: Self-pay | Admitting: Internal Medicine

## 2013-03-04 ENCOUNTER — Encounter (HOSPITAL_COMMUNITY)
Admission: RE | Disposition: A | Discharge status: Home / Self Care | Payer: Self-pay | Source: Ambulatory Visit | Attending: Internal Medicine

## 2013-03-04 DIAGNOSIS — I442 Atrioventricular block, complete: Secondary | ICD-10-CM

## 2013-03-04 HISTORY — PX: PERMANENT PACEMAKER INSERTION: SHX5480

## 2013-03-04 LAB — CBC
HCT: 37.4 % — ABNORMAL LOW (ref 39.0–52.0)
Hemoglobin: 12.5 g/dL — ABNORMAL LOW (ref 13.0–17.0)
Hemoglobin: 13.1 g/dL (ref 13.0–17.0)
MCH: 31 pg (ref 26.0–34.0)
MCHC: 35.1 g/dL (ref 30.0–36.0)
MCV: 88.4 fL (ref 78.0–100.0)
Platelets: 158 10*3/uL (ref 150–400)
RBC: 4.05 MIL/uL — ABNORMAL LOW (ref 4.22–5.81)
RBC: 4.23 MIL/uL (ref 4.22–5.81)
WBC: 12.2 10*3/uL — ABNORMAL HIGH (ref 4.0–10.5)

## 2013-03-04 SURGERY — PERMANENT PACEMAKER INSERTION
Anesthesia: LOCAL

## 2013-03-04 MED ORDER — ACETAMINOPHEN 325 MG PO TABS: 325.0000 mg | ORAL_TABLET | ORAL | Status: DC | PRN

## 2013-03-04 MED ORDER — MIDAZOLAM HCL 5 MG/5ML IJ SOLN
INTRAMUSCULAR | Status: AC
  Filled 2013-03-04: qty 5

## 2013-03-04 MED ORDER — ONDANSETRON HCL 4 MG/2ML IJ SOLN: 4.0000 mg | Freq: Four times a day (QID) | INTRAMUSCULAR | Status: DC | PRN

## 2013-03-04 MED ORDER — CEFAZOLIN SODIUM-DEXTROSE 2-3 GM-% IV SOLR
2.0000 g | Freq: Four times a day (QID) | INTRAVENOUS | Status: AC
  Administered 2013-03-04 – 2013-03-05 (×3): 2 g via INTRAVENOUS
  Filled 2013-03-04 (×3): qty 50

## 2013-03-04 MED ORDER — FENTANYL CITRATE 0.05 MG/ML IJ SOLN
INTRAMUSCULAR | Status: AC
  Filled 2013-03-04: qty 2

## 2013-03-04 MED ORDER — HEPARIN SODIUM (PORCINE) 5000 UNIT/ML IJ SOLN
5000.0000 [IU] | Freq: Three times a day (TID) | INTRAMUSCULAR | Status: DC
  Administered 2013-03-04 – 2013-03-07 (×8): 5000 [IU] via SUBCUTANEOUS
  Filled 2013-03-04 (×11): qty 1

## 2013-03-04 MED ORDER — HEPARIN (PORCINE) IN NACL 2-0.9 UNIT/ML-% IJ SOLN
INTRAMUSCULAR | Status: AC
  Filled 2013-03-04: qty 500

## 2013-03-04 MED ORDER — LIDOCAINE HCL (PF) 1 % IJ SOLN
INTRAMUSCULAR | Status: AC
  Filled 2013-03-04: qty 60

## 2013-03-04 NOTE — Op Note (Signed)
DDD PM inserted via the right subclavian vein without immediate complication. Z#[id number]

## 2013-03-04 NOTE — Plan of Care (Signed)
Problem: Phase I Progression Outcomes Goal: Lab values on chart Outcome: Completed/Met Date Met:  03/04/13 See [facility] for results

## 2013-03-04 NOTE — Progress Notes (Signed)
Patient ID: Robert Cuevas, male   DOB: [birth year], 79 y.o.   MRN: [id number] Subjective:  "I feel good"  Objective:  Vital Signs in the last 24 hours: Temp:  [97.4 F (36.3 C)-98 F (36.7 C)] 97.9 F (36.6 C) (05/16 0400) Pulse Rate:  [68-70] 70 (05/16 0400) Resp:  [16-19] 17 (05/16 0400) BP: (75-112)/(35-61) 108/44 mmHg (05/16 0400) SpO2:  [90 %-98 %] 98 % (05/16 0400)  Intake/Output from previous day: 05/15 0701 - 05/16 0700 In: 2034 [P.O.:960; I.V.:1074] Out: 750 [Urine:750] Intake/Output from this shift:    Physical Exam: Well appearing NAD HEENT: Unremarkable Neck:  7 cm JVD, no thyromegally, left neck temporary-perm TV PM in place Back:  No CVA tenderness Lungs:  Clear with no wheezes HEART:  Regular rate rhythm, no murmurs, no rubs, no clicks Abd:  Flat, positive bowel sounds, no organomegally, no rebound, no guarding Ext:  2 plus pulses, no edema, no cyanosis, no clubbing Skin:  No rashes no nodules Neuro:  CN II through XII intact, motor grossly intact  Lab Results:  Recent Labs  03/03/13 1558 03/04/13 0355  WBC 12.4* 12.2*  HGB 12.4* 12.5*  PLT 173 164    Recent Labs  03/02/13 2204  CREATININE 0.78   No results found for this basename: TROPONINI, CK, MB,  in the last 72 hours Hepatic Function Panel No results found for this basename: PROT, ALBUMIN, AST, ALT, ALKPHOS, BILITOT, BILIDIR, IBILI,  in the last 72 hours No results found for this basename: CHOL,  in the last 72 hours No results found for this basename: PROTIME,  in the last 72 hours  Imaging: Dg Chest Port 1 View  03/03/2013   *RADIOLOGY REPORT*  Clinical Data: Pneumothorax.  PORTABLE CHEST - 1 VIEW  Comparison: 03/02/2013.  Findings: Tiny amount of lucency is present along the descending thoracic aortic contour projected over the left hilum. This has decreased compared yesterday's exam.  The pacemaker power pack is partially visualized. The cardiopericardial silhouette appears within normal  limits allowing for volumes of inspiration.  Blunting left costophrenic angle may represent atelectasis or a tiny effusion.  Lung volumes are lower than on the prior exam.  IMPRESSION: Tiny left paramediastinal pneumothorax or pneumomediastinum. Interval decrease compared to prior.  Low volume chest.   Original Report Authenticated By: Andreas Newport, M.D.   Dg Chest Portable 1 View  03/02/2013   *RADIOLOGY REPORT*  Clinical Data: Post implant.  Evaluate for pneumothorax.  PORTABLE CHEST - 1 VIEW  Comparison: 10/24/2012.  Findings: Heart size stable.  A single left-sided pacemaker lead tip projects over the right ventricle.  Minimal bibasilar atelectasis.  No pleural fluid.  IMPRESSION: Trace medial left pneumothorax after pacemaker insertion. These results will be called to the ordering clinician or representative by the Radiologist Assistant, and communication documented in the PACS Dashboard.   Original Report Authenticated By: Leanna Battles, M.D.   Ct Angio Chest Aorta W/cm &/or Wo/cm  03/03/2013   *RADIOLOGY REPORT*  Clinical Data: Pacemaker extraction.  Possible pneumomediastinum versus pseudoaneurysm.  CT ANGIOGRAPHY CHEST  Technique:  Multidetector CT imaging of the chest using the standard protocol during bolus administration of intravenous contrast. Multiplanar reconstructed images including MIPs were obtained and reviewed to evaluate the vascular anatomy.  Contrast: OMNIPAQUE IOHEXOL 350 MG/ML SOLN  Comparison: None.  Findings: Noncontrast images demonstrate no evidence of intramural hematoma.  Atherosclerotic faster calcifications involving the entire thoracic aorta are present.  Prominent aortic valve calcifications.  The left main and  three-vessel coronary artery calcifications.  A left internal jugular percutaneous pacemaker lead traverses into the central venous system and is within the right ventricle.  A second lead is also in place with its tip at the right ventricle apex. There are  calcifications about the lead within the left innominate vein.  The proximal extent is in the left innominate vein. There is no evidence of pseudoaneurysm in the left neck or left supraclavicular region.  Maximal diameters of the ascending aorta at the sinus of also although, sinotubular junction, and ascending aorta are 3.9 cm, 2.8 cm, and 3.6 cm.  There is no evidence of aortic dissection or saccular aneurysm.  There is some irregular plaque within the distal arch and proximal descending aorta.  Innominate artery, right subclavian artery, right common carotid artery are patent.  Portions of the right subclavian artery are suboptimally visualized secondary to contrast in the right subclavian vein.  Left common carotid artery and left subclavian artery are also widely patent.  There is no evidence of pseudoaneurysm or dissection within the left common carotid or left subclavian arteries.  There is mild chronic appearing plaque in the proximal left subclavian artery.  There is also some calcified plaque in the proximal left vertebral artery.  It is patent.  Mild plaque at the origin of the right vertebral artery.  It is also grossly patent.  There is soft tissue and fluid density as well as gas involving the left pectoralis muscle and overlying subcutaneous tissue consistent with recent pacemaker removal.  There is some thickening of the musculature either due to postoperative change or inflammatory change.  Similar changes in the left neck at the percutaneous leads entry site.  No abnormal mediastinal adenopathy.  No pericardial effusion.  No pneumomediastinum. A small left basilar anterior pneumothorax is present which is less than 5%.  Minimal bilateral pleural effusions associated with bibasilar dependent volume loss.  No mass or consolidation.  Healing right anterolateral rib fracture on image 105.  It has a subacute appearance.  Thoracic spine is demineralized without compression deformity.  IMPRESSION: No  evidence of aortic dissection or aneurysm.  No intramural hematoma.  Postoperative changes in the anterior chest and left neck without evidence of pseudoaneurysm formation.  Less than 5% left anterior and basilar pneumothorax.  Healing subacute right rib fracture.   Original Report Authenticated By: Jolaine Click, M.D.    Cardiac Studies: Tele - nsr with asynchronous ventricular pacing  Assessment/Plan:  1. PM infection 2. S/p PM system extraction 3. CHB 4. S/p temporary-perm TV PM 5. Hypotension, resolved with hydration, CT scan with no evidence of vascular damage Rec: Will plan to proceed with PPM insertion via right subclavian approach today. I have discussed the risks/benefits/goals/expectations of PPM insertion with the patient and his wife and they wish to proceed.  LOS: 2 days    Abisai Coble,M.D. 03/04/2013, 7:43 AM

## 2013-03-05 ENCOUNTER — Inpatient Hospital Stay (HOSPITAL_COMMUNITY): Payer: Medicare Other

## 2013-03-05 LAB — MAGNESIUM: Magnesium: 1.7 mg/dL (ref 1.5–2.5)

## 2013-03-05 LAB — BASIC METABOLIC PANEL
CO2: 26 mEq/L (ref 19–32)
Chloride: 97 mEq/L (ref 96–112)
Glucose, Bld: 179 mg/dL — ABNORMAL HIGH (ref 70–99)
Potassium: 4.3 mEq/L (ref 3.5–5.1)
Sodium: 134 mEq/L — ABNORMAL LOW (ref 135–145)

## 2013-03-05 MED ORDER — MAGNESIUM OXIDE 400 (241.3 MG) MG PO TABS
400.0000 mg | ORAL_TABLET | Freq: Two times a day (BID) | ORAL | Status: AC
  Administered 2013-03-05 – 2013-03-06 (×2): 400 mg via ORAL
  Filled 2013-03-05 (×3): qty 1

## 2013-03-05 MED ORDER — METOPROLOL TARTRATE 25 MG PO TABS
25.0000 mg | ORAL_TABLET | Freq: Two times a day (BID) | ORAL | Status: DC
  Administered 2013-03-05: 25 mg via ORAL
  Filled 2013-03-05 (×2): qty 1

## 2013-03-05 NOTE — Progress Notes (Signed)
   Patient Name: Robert Cuevas      SUBJECTIVE: Admitted with an infected pacing system for which he underwent extraction complicated by a small pneumothorax. There was also need for temporary transvenous pacemaker. He underwent dual-chamber pacing yesterday.  Past Medical History  Diagnosis Date  . High cholesterol   . Hypertension   . HOH (hard of hearing)   . Pacemaker   . Cancer     skin cancer removed from head    PHYSICAL EXAM Filed Vitals:   03/05/13 0900 03/05/13 0928 03/05/13 1100 03/05/13 1139  BP: 112/39  107/67   Pulse: 102 100  97  Temp:    97.8 F (36.6 C)  TempSrc:    Oral  Resp: 19 15  18   SpO2: 95% 93%  97%    Well developed and nourished in no acute distress HENT normal Neck supple with JVP-flat Clear Pocket wtihout hematoma B Regular rate and rhythm, no murmurs or gallops Abd-soft with active BS No Clubbing cyanosis edema Skin-warm and dry A & Oriented  Grossly normal sensory and motor function   TELEMETRY: Reviewed telemetry P-synchronous/ AV  pacing    Intake/Output Summary (Last 24 hours) at 03/05/13 1148 Last data filed at 03/05/13 0900  Gross per 24 hour  Intake    680 ml  Output    375 ml  Net    305 ml    LABS: Basic Metabolic Panel:  Recent Labs Lab 03/02/13 2204 03/04/13 1406  CREATININE 0.78 0.73   Cardiac Enzymes: No results found for this basename: CKTOTAL, CKMB, CKMBINDEX, TROPONINI,  in the last 72 hours CBC:  Recent Labs Lab 03/02/13 2204 03/03/13 1558 03/04/13 0355 03/04/13 1406  WBC 12.3* 12.4* 12.2* 11.5*  HGB 12.6* 12.4* 12.5* 13.1  HCT 36.1* 36.3* 35.6* 37.4*  MCV 87.6 88.1 87.9 88.4  PLT 175 173 164 158     Device Interrogation: normla device function  ASSESSMENT AND PLAN: For discharge today followup  week of [facility]. I have alerted schedulers to call him  Have reviewed instructions. They're to keep the wound dry until they see Dr. Ladona Ridgel per his instruction   Signed, Sherryl Manges  MD  03/05/2013

## 2013-03-05 NOTE — Progress Notes (Signed)
Ambulated in hall short distance ( c/o restlessness to nurse tech and spouse).ECG monitor denotes  2 and 3 beat runs v-tach w/ 7 beat run SVT  .HR currently 90's .  see VS section.Pt denies discomfort . Mild DOE noted .

## 2013-03-05 NOTE — Progress Notes (Addendum)
Pt had frequent ectopy/NSVT/triplets/couplets/nonsustained SVT throughout 3 minute walk accompanied by mild SOB, no chest pain. D/w Dr. Elease Hashimoto. Will restart BB at lower dose this evening and observe, check labs, likely DC in am if improved with ambulation. Pt and family made aware of plan. Norvell Ureste PA-C

## 2013-03-05 NOTE — Op Note (Signed)
[name], Robert Cuevas NO.:  [id number]  MEDICAL RECORD NO.:  [id number]  LOCATION:  2927                         FACILITY:  [facility]  PHYSICIAN:  Doylene Canning. Ladona Ridgel, MD    DATE OF BIRTH:  10-30-1929  DATE OF PROCEDURE:  03/04/2013 DATE OF DISCHARGE:                              OPERATIVE REPORT   PROCEDURE PERFORMED:  Insertion of a dual-chamber pacemaker followed by removal of a temporary permanent transvenous pacemaker.  INTRODUCTION:  The patient is an 79 year old man who is admitted to the hospital 2 days ago with an infected pacing lead sticking through his skin.  He had a pacemaker placed initially 21 years prior with a 68 year old leads.  His device was successfully extracted with complication of a 5% pneumothorax.  His postoperative course was otherwise unremarkable. He is now referred for insertion of a new dual-chamber pacing system. The patient has had no fevers or chills or systemic evidence of infection.  DESCRIPTION OF PROCEDURE:  After informed consent was obtained, the patient was taken to the diagnostic EP lab in a fasting state.  After usual preparation and draping, intravenous fentanyl and midazolam were given for sedation.  A 30 mL of lidocaine was infiltrated into the right infraclavicular region.  A 5-cm incision was carried out over this region and electrocautery was utilized to dissect down to the fascial plane.  Initial attempts to puncture the right subclavian vein were unsuccessful and 10 mL of contrast was injected into the right upper extremity venous system which demonstrated that the vein was displaced cephalad.  It was then successfully punctured and the [facility]. Jude 2088T 52- cm active fixation pacing lead, serial #[id number] was advanced into the right ventricle and the St. Jude model 2088T 46 cm, pacing lead serial #[id number] was advanced into the right atrium.  Mapping was carried out in the right ventricle.  At the final site, the  R-waves measured 4 mV (paced) and the pacing impedance was 500 ohms.  The threshold was a V at 0.4 milliseconds.  A 10 V pacing did not stimulate the diaphragm.  A large injury current was present.  Attention was then turned to the atrial lead.  The P-waves were 1.5 mV when the lead was actively fixed and the threshold was 1.7 V at 0.4 milliseconds with a pacing impedance of 455 ohms.  Again, 10 V pacing did not stimulate the diaphragm and again there was a large injury current with active fixation lead.  With these satisfactory parameters, the lead was secured to the subpectoral fascia with a figure-of-eight silk suture and the sewing sleeve was secured with silk suture.  Electrocautery was utilized to make a subcutaneous pocket.  Antibiotic irrigation was utilized to irrigate the pocket and electrocautery was utilized to assure hemostasis.  The St. Jude Acc[employer] DR RF dual-chamber pacemaker was connected to the atrial and ventricular leads and placed back in the subcutaneous pocket.  The pocket was irrigated with antibiotic irrigation and the incision was closed with 2-0 and 3-0 Vicryl.  Benzoin and Steri-Strips were painted on the skin.  A pressure dressing was applied.  At this point, I turned my attention to removal of  the temporary transvenous pacing lead.  The old generator was disconnected from the temporary transvenous lead without difficulty.  The silk sutures were freed up from the skin.  The stylet was advanced into the lead and the helix retracted and the lead was removed in total.  A pressure dressing was placed over the left internal jugular venous area and a Band-Aid was placed and the patient was returned to his room in satisfactory condition.  COMPLICATIONS:  There were no immediate procedure complications.  RESULTS:  This demonstrates successful implantation of a new St. Jude dual-chamber pacemaker and removal of previously implanted dual-chamber pacing lead without  immediate procedure complication.     Doylene Canning. Ladona Ridgel, MD     GWT/MEDQ  D:  03/04/2013  T:  03/05/2013  Job:  [id number]  cc:   Gerlene Burdock A. Alanda Amass, M.D. Thurmon Fair, MD

## 2013-03-05 NOTE — Plan of Care (Signed)
Problem: Phase III Progression Outcomes Goal: Ambulating in room or hall Outcome: Progressing Mild DOE , occ couplets and triplets of V-tach , 7 -beat run SVT during short amb in hall  . Dr aware.Pt w/o discomfort or labored breathing <  1 minute after amb .

## 2013-03-06 ENCOUNTER — Encounter (HOSPITAL_COMMUNITY): Payer: Self-pay | Admitting: Physician Assistant

## 2013-03-06 ENCOUNTER — Inpatient Hospital Stay (HOSPITAL_COMMUNITY): Payer: Medicare Other

## 2013-03-06 DIAGNOSIS — R0902 Hypoxemia: Secondary | ICD-10-CM

## 2013-03-06 DIAGNOSIS — R000 Tachycardia, unspecified: Secondary | ICD-10-CM

## 2013-03-06 DIAGNOSIS — Z950 Presence of cardiac pacemaker: Secondary | ICD-10-CM

## 2013-03-06 HISTORY — DX: Presence of cardiac pacemaker: Z95.0

## 2013-03-06 MED ORDER — BISACODYL 5 MG PO TBEC
5.0000 mg | DELAYED_RELEASE_TABLET | Freq: Every day | ORAL | Status: DC | PRN
  Administered 2013-03-06: 5 mg via ORAL
  Filled 2013-03-06: qty 1

## 2013-03-06 MED ORDER — METOPROLOL TARTRATE 50 MG PO TABS
50.0000 mg | ORAL_TABLET | Freq: Two times a day (BID) | ORAL | Status: DC
  Administered 2013-03-06 – 2013-03-07 (×3): 50 mg via ORAL
  Filled 2013-03-06 (×4): qty 1

## 2013-03-06 NOTE — Progress Notes (Signed)
  Patient Name: Robert Cuevas      SUBJECTIVE: feeling better  Kept overnight 2/2 tachycardia some with P-synchronous/ AV  pacing and others at rates of 200 bpm with similar morphology suggesting bundle branch reentry  Past Medical History  Diagnosis Date  . High cholesterol   . Hypertension   . HOH (hard of hearing)   . Complete heart block     a. s/p gen change 07/2011. b. Pacer pocket infx 02/2013 - s/p extraction, temp perm placement, then eventual implantation of new St. Jude pacemaker 03/04/13.  . Cancer     skin cancer removed from head  . Coronary artery calcification     Seen on CT 02/2013.  Marland Kitchen Hyperglycemia     PHYSICAL EXAM Filed Vitals:   03/06/13 0147 03/06/13 0312 03/06/13 0709 03/06/13 0740  BP:  109/48 136/56   Pulse: 65 72 83 73  Temp:  98.4 F (36.9 C) 98 F (36.7 C)   TempSrc:  Oral Oral   Resp: 9 15 14 15   SpO2: 91% 93% 97% 94%    Well developed and nourished in no acute distress HENT normal Neck supple with JVP-flat Carotids brisk and full without bruits Clear some ronchi Regular rate and rhythm, no murmurs or gallops Abd-soft with active BS without hepatomegaly No Clubbing cyanosis edema Skin-warm and dry A & Oriented  Grossly normal sensory and motor function  TELEMETRY: Reviewed telemetry pt in P-synchronous/ AV  pacing     Intake/Output Summary (Last 24 hours) at 03/06/13 0852 Last data filed at 03/06/13 0400  Gross per 24 hour  Intake    650 ml  Output    925 ml  Net   -275 ml    LABS: Basic Metabolic Panel:  Recent Labs Lab 03/02/13 2204 03/04/13 1406 03/05/13 1537  NA  --   --  134*  K  --   --  4.3  CL  --   --  97  CO2  --   --  26  GLUCOSE  --   --  179*  BUN  --   --  15  CREATININE 0.78 0.73 0.81  CALCIUM  --   --  9.0  MG  --   --  1.7   Cardiac Enzymes: No results found for this basename: CKTOTAL, CKMB, CKMBINDEX, TROPONINI,  in the last 72 hours CBC:  Recent Labs Lab 03/02/13 2204 03/03/13 1558  03/04/13 0355 03/04/13 1406  WBC 12.3* 12.4* 12.2* 11.5*  HGB 12.6* 12.4* 12.5* 13.1  HCT 36.1* 36.3* 35.6* 37.4*  MCV 87.6 88.1 87.9 88.4  PLT 175 173 164 158    Device Interrogation normal device function   ASSESSMENT AND PLAN:  Active Problems:   Complete heart block   Infection of pacemaker pocket   Tachycardia   Hypoxia   Pacemaker- St Jude   Will ambulate today and use incentive spirometer as i suspect atelectasis may be cause for hypoxemia  Will get 2v CXR to make sure no pneumothorax as y'day xray was hard to read Anticipate home in am  Signed, Sherryl Manges MD  03/06/2013

## 2013-03-06 NOTE — Discharge Summary (Signed)
Discharge Summary   Patient ID: Robert Cuevas MRN: [id number], DOB/AGE: [birth year] 79 y.o. Admit date: 03/02/2013 D/C date:     03/07/2013  Primary Cardiologist: [facility]  Primary Discharge Diagnoses:  1. Pacemaker pocket infection - (history of complete heart block s/p initial pacemaker generator change 07/2011) - s/p extraction 03/02/13 with temp perm placement, then removal of temp perm with new St. Jude pacemaker implantation 03/04/13 2. Small pneumothorax 3. Coronary artery calcifications on CT angio 03/03/13 4. Hyperglycemia 5. Hypoxia in setting of atelectasis - resolved  Secondary Discharge Diagnoses:  1. HL 2. HTN 3. Hard of hearing 4. Skin cancer removed from head  Hospital Course: Robert Cuevas is an 79 y/o M with history of HTN, HL, and complete heart block who underwent device generator changeout 08/2011 who was referred to Dr. Ladona Cuevas in early May for evaluation of pacemaker pocket infection. He had developed thinning, erythema, and eventual extrusion of his pacemaker lead from his pacemaker pocket. He denied fevers or chills. Dr. Ladona Cuevas felt there was no way to heal this without total removal of his pacing system. On 03/02/13 he underwent pacemaker extraction and insertion of a temporary perm PM. This was complicated by small pneumothorax that was conservatively managed. CT angio showed no evidence of vascular damage. He had some hypotension improved with hydration (anti-hypertensives were held). He subsequently underwent removal of temp perm and insertion of new St. Jude dual chamber pacemaker 03/04/13. F/u CXR 03/05/13 did not show a pneumothorax. He ambulated 5/17 with mild SOB and frequent ectopy. Labs were checked and magnesium was repleted orally. Beta blocker was restarted and rhythm was stable overnight. Pacemaker was reprogrammed the following morning and he had no further rhythm disturbances. However, on 5/18 he was noted to desat with ambulation into the upper 80's. As  CXR the previous day had shown some atelectasis, incentive spirometry was initiated. He remained in the hospital on 5/18 with a goal of ambulation and IS usage.  With this, O2 saturations improved into the 90's.  He had no complaints of dyspnea and this AM is felt to be ready for discharge.  We have arranged for device clinic f/u in 1 week at which time left sided sutures and right sided steri-strips will be removed.  Discharge Vitals: Blood pressure 102/55, pulse 75, temperature 97.5 F (36.4 C), temperature source Oral, resp. rate 18, SpO2 91.00%.  Labs: Lab Results  Component Value Date   WBC 9.1 03/07/2013   HGB 12.2* 03/07/2013   HCT 35.6* 03/07/2013   MCV 87.7 03/07/2013   PLT 158 03/07/2013     Recent Labs Lab 03/07/13 0505  NA 134*  K 4.0  CL 100  CO2 25  BUN 14  CREATININE 0.68  CALCIUM 9.0  GLUCOSE 151*    Diagnostic Studies/Procedures   2D Echo 03/03/13 - Left ventricle: The cavity size was normal. Wall thickness was increased in a pattern of mild LVH. Systolic function was normal. The estimated ejection fraction was in the range of 55% to 60%. Doppler parameters are consistent with high ventricular filling pressure. - Mitral valve: Calcified annulus. - Right ventricle: The cavity size was dilated. - Right atrium: The atrium was mildly dilated. Impressions: - Extremely limited due to poor sound wave transmission; LV function appears to be preserved; focal wall motion abnormality cannot be excluded; aortic valve not well visualized; right heart not well visualized but appears to be enlarged; suggest TEE or cardiac MRI if clinically indicated.  Dg Chest  2 View 03/05/2013   *RADIOLOGY REPORT*  Clinical Data: Pacemaker insertion yesterday.  CHEST - 2 VIEW  IMPRESSION: Minimal atelectasis at the right base.  Note is made of a gauze pad overlying the left shoulder.  This is probably on the patient's skin.   Original Report Authenticated By: Francene Boyers, M.D.   Dg Chest Port  1 View 03/03/2013   *RADIOLOGY REPORT*  Clinical Data: Pneumothorax.  PORTABLE CHEST - 1 VIEW  .  IMPRESSION: Tiny left paramediastinal pneumothorax or pneumomediastinum. Interval decrease compared to prior.  Low volume chest.   Original Report Authenticated By: Andreas Newport, M.D.   Dg Chest Portable 1 View 03/02/2013   *RADIOLOGY REPORT*  Clinical Data: Post implant.  Evaluate for pneumothorax.  PORTABLE CHEST - 1 VIEW  IMPRESSION: Trace medial left pneumothorax after pacemaker insertion. These results will be called to the ordering clinician or representative by the Radiologist Assistant, and communication documented in the PACS Dashboard.   Original Report Authenticated By: Leanna Battles, M.D.   Ct Angio Chest Aorta W/cm &/or Wo/cm 03/03/2013   *RADIOLOGY REPORT*  Clinical Data: Pacemaker extraction.  Possible pneumomediastinum versus pseudoaneurysm.  CT ANGIOGRAPHY CHEST  IMPRESSION: No evidence of aortic dissection or aneurysm.  No intramural hematoma.  Postoperative changes in the anterior chest and left neck without evidence of pseudoaneurysm formation.  Less than 5% left anterior and basilar pneumothorax.  Healing subacute right rib fracture.   Original Report Authenticated By: Jolaine Click, M.D.   Discharge Medications     Medication List    STOP taking these medications       benazepril 20 MG tablet  Commonly known as:  LOTENSIN      TAKE these medications       aspirin EC 81 MG tablet  Take 81 mg by mouth daily.     atorvastatin 20 MG tablet  Commonly known as:  LIPITOR  Take 20 mg by mouth daily.     Fish Oil 1200 MG Caps  Take 2,400 mg by mouth daily.     metoprolol 50 MG tablet  Commonly known as:  LOPRESSOR  Take 1 tablet (50 mg total) by mouth 2 (two) times daily.     niacin 500 MG tablet  Commonly known as:  SLO-NIACIN  Take 1,000 mg by mouth at bedtime.        Disposition   The patient will be discharged in stable condition to home.  Future Appointments  Provider Department Dept Phone   03/17/2013 10:00 AM [facility] [employer] Main Office [place]) [phone]   06/08/2013 11:00 AM Marinus Maw, MD [facility] Main Office [place]) [phone]     Follow-up Information   Follow up with Lewayne Bunting, MD On 06/08/2013. (11:00 AM)    Contact information:   [address] [place] [address] [phone]       Follow up with Primary Care Doctor. (Your blood sugar was intermittently elevated. Please contact primary care doctor to further evaluate.)       Follow up with [facility] On 03/17/2013. (10:00 AM)    Contact information:   [address] [place] [address] [phone]        Duration of Discharge Encounter: Greater than 30 minutes including physician and PA time.  Signed, Nicolasa Ducking, NP 9:55 AM

## 2013-03-07 LAB — CBC
Hemoglobin: 12.2 g/dL — ABNORMAL LOW (ref 13.0–17.0)
Platelets: 158 10*3/uL (ref 150–400)
RBC: 4.06 MIL/uL — ABNORMAL LOW (ref 4.22–5.81)
WBC: 9.1 10*3/uL (ref 4.0–10.5)

## 2013-03-07 LAB — BASIC METABOLIC PANEL
CO2: 25 mEq/L (ref 19–32)
Calcium: 9 mg/dL (ref 8.4–10.5)
Chloride: 100 mEq/L (ref 96–112)
Glucose, Bld: 151 mg/dL — ABNORMAL HIGH (ref 70–99)
Potassium: 4 mEq/L (ref 3.5–5.1)
Sodium: 134 mEq/L — ABNORMAL LOW (ref 135–145)

## 2013-03-07 MED ORDER — METOPROLOL TARTRATE 50 MG PO TABS: 50.0000 mg | ORAL_TABLET | Freq: Two times a day (BID) | ORAL | Status: DC

## 2013-03-07 NOTE — Progress Notes (Signed)
Patient ID: Robert Cuevas, male   DOB: [birth year], 79 y.o.   MRN: [id number] Subjective:  "I am ready to go home"  Objective:  Vital Signs in the last 24 hours: Temp:  [97.5 F (36.4 C)-98.3 F (36.8 C)] 97.5 F (36.4 C) (05/19 0500) Pulse Rate:  [71-75] 75 (05/19 0500) Resp:  [16-20] 18 (05/19 0500) BP: (102-130)/(51-65) 102/55 mmHg (05/19 0500) SpO2:  [91 %-95 %] 91 % (05/19 0500)  Intake/Output from previous day: 05/18 0701 - 05/19 0700 In: 960 [P.O.:960] Out: 950 [Urine:950] Intake/Output from this shift:    Physical Exam: Well appearing NAD HEENT: Unremarkable Neck:  No JVD, no thyromegally Back:  No CVA tenderness Lungs:  Clear with no wheezes, incisions clean and dry HEART:  Regular rate rhythm, no murmurs, no rubs, no clicks Abd:  Flat, positive bowel sounds, no organomegally, no rebound, no guarding Ext:  2 plus pulses, no edema, no cyanosis, no clubbing Skin:  No rashes no nodules Neuro:  CN II through XII intact, motor grossly intact  Lab Results:  Recent Labs  03/04/13 1406 03/07/13 0505  WBC 11.5* 9.1  HGB 13.1 12.2*  PLT 158 158    Recent Labs  03/05/13 1537 03/07/13 0505  NA 134* 134*  K 4.3 4.0  CL 97 100  CO2 26 25  GLUCOSE 179* 151*  BUN 15 14  CREATININE 0.81 0.68   No results found for this basename: TROPONINI, CK, MB,  in the last 72 hours Hepatic Function Panel No results found for this basename: PROT, ALBUMIN, AST, ALT, ALKPHOS, BILITOT, BILIDIR, IBILI,  in the last 72 hours No results found for this basename: CHOL,  in the last 72 hours No results found for this basename: PROTIME,  in the last 72 hours  Imaging: Dg Chest 2 View  03/06/2013   *RADIOLOGY REPORT*  Clinical Data: Follow-up pneumothorax  CHEST - 2 VIEW  Comparison: Chest radiograph 03/05/2013 and chest CT 03/03/2013  Findings: Right chest wall dual lead pacer is in place.  Heart size is within normal limits and stable.  Pulmonary vascularity is normal.  There are  trace bilateral pleural effusions.  There is no visible pneumothorax.  The patient cannot completely raise one of their arms for the lateral view.  IMPRESSION:  1.  Trace bilateral pleural effusions. 2.  No visible pneumothorax.   Original Report Authenticated By: Britta Mccreedy, M.D.    Cardiac Studies: Tele - NSR with ventricular pacing Assessment/Plan:  1. PM pocket infection 2. S/p extraction with temp-perm PM 3. S/p re-implantation of a new DDD pacing system on the contralateral side 4. Post op atrial/ventricular ectopy 5. Sob, likely due to atelectasis Rec: ok for discharge. followup in our office next Monday to have sutures removed on the left side and steri-strips on the right side. No heavy lifting. Continue outpatient meds.  LOS: 5 days    Robert Cuevas,M.D. 03/07/2013, 8:22 AM

## 2013-03-09 ENCOUNTER — Telehealth: Payer: Self-pay | Admitting: *Deleted

## 2013-03-09 NOTE — Telephone Encounter (Signed)
TCM pt.  Wound Check appt in Device Clinic on 03/17/2013.  Patient contacted regarding discharge from [facility].  Patient understands to follow up with provider Device Clinic on 03/17/2013 at 10:00am. Patient understands discharge instructions? yes Patient understands medications and regiment? yes Patient understands to bring all medications to this visit? yes

## 2013-03-17 ENCOUNTER — Ambulatory Visit (INDEPENDENT_AMBULATORY_CARE_PROVIDER_SITE_OTHER): Payer: Medicare Other | Admitting: *Deleted

## 2013-03-17 DIAGNOSIS — R000 Tachycardia, unspecified: Secondary | ICD-10-CM

## 2013-03-17 DIAGNOSIS — I442 Atrioventricular block, complete: Secondary | ICD-10-CM

## 2013-03-17 LAB — PACEMAKER DEVICE OBSERVATION
AL IMPEDENCE PM: 437.5 Ohm
AL THRESHOLD: 1 V
ATRIAL PACING PM: 24
BAMS-0003: 70 {beats}/min
BATTERY VOLTAGE: 2.9779 V
DEVICE MODEL PM: 7474476
VENTRICULAR PACING PM: 100

## 2013-03-17 NOTE — Progress Notes (Signed)
Wound check-PPM on the right and extraction on the left.  Sutures removed today per Dr.Taylor.

## 2013-03-24 ENCOUNTER — Encounter: Payer: Self-pay | Admitting: Cardiovascular Disease

## 2013-04-29 ENCOUNTER — Other Ambulatory Visit: Payer: Self-pay | Admitting: Cardiovascular Disease

## 2013-04-29 ENCOUNTER — Other Ambulatory Visit: Payer: Self-pay | Admitting: *Deleted

## 2013-04-29 DIAGNOSIS — R0989 Other specified symptoms and signs involving the circulatory and respiratory systems: Secondary | ICD-10-CM

## 2013-04-29 LAB — CBC WITH DIFFERENTIAL/PLATELET
Basophils Absolute: 0 10*3/uL (ref 0.0–0.1)
HCT: 44.4 % (ref 39.0–52.0)
Lymphocytes Relative: 21 % (ref 12–46)
Lymphs Abs: 1.4 10*3/uL (ref 0.7–4.0)
MCV: 87.1 fL (ref 78.0–100.0)
Monocytes Absolute: 1.2 10*3/uL — ABNORMAL HIGH (ref 0.1–1.0)
Neutro Abs: 4 10*3/uL (ref 1.7–7.7)
RBC: 5.1 MIL/uL (ref 4.22–5.81)
RDW: 14.2 % (ref 11.5–15.5)
WBC: 6.7 10*3/uL (ref 4.0–10.5)

## 2013-04-29 LAB — COMPREHENSIVE METABOLIC PANEL
ALT: 125 U/L — ABNORMAL HIGH (ref 0–53)
AST: 69 U/L — ABNORMAL HIGH (ref 0–37)
Albumin: 4.2 g/dL (ref 3.5–5.2)
BUN: 15 mg/dL (ref 6–23)
CO2: 25 mEq/L (ref 19–32)
Calcium: 9.4 mg/dL (ref 8.4–10.5)
Chloride: 104 mEq/L (ref 96–112)
Potassium: 4.9 mEq/L (ref 3.5–5.3)

## 2013-05-02 ENCOUNTER — Telehealth: Payer: Self-pay | Admitting: Cardiovascular Disease

## 2013-05-02 NOTE — Telephone Encounter (Signed)
Pt's wife called and stated that she is taking Robert Cuevas to see Dr. Ladona Ridgel today for his breathing. He has severe SOB and she wanted to take him over to see them. She stated that if Dr. Alanda Amass needs to call her she will be at home.

## 2013-05-03 ENCOUNTER — Encounter: Payer: Self-pay | Admitting: Internal Medicine

## 2013-05-03 ENCOUNTER — Ambulatory Visit (INDEPENDENT_AMBULATORY_CARE_PROVIDER_SITE_OTHER): Payer: Medicare Other | Admitting: Internal Medicine

## 2013-05-03 VITALS — BP 128/64 | HR 78 | Ht 68.0 in | Wt 137.6 lb

## 2013-05-03 DIAGNOSIS — Z5189 Encounter for other specified aftercare: Secondary | ICD-10-CM

## 2013-05-03 DIAGNOSIS — T827XXD Infection and inflammatory reaction due to other cardiac and vascular devices, implants and grafts, subsequent encounter: Secondary | ICD-10-CM

## 2013-05-03 DIAGNOSIS — R0989 Other specified symptoms and signs involving the circulatory and respiratory systems: Secondary | ICD-10-CM

## 2013-05-03 DIAGNOSIS — R000 Tachycardia, unspecified: Secondary | ICD-10-CM

## 2013-05-03 DIAGNOSIS — R0600 Dyspnea, unspecified: Secondary | ICD-10-CM

## 2013-05-03 DIAGNOSIS — Z950 Presence of cardiac pacemaker: Secondary | ICD-10-CM

## 2013-05-03 DIAGNOSIS — I442 Atrioventricular block, complete: Secondary | ICD-10-CM

## 2013-05-03 LAB — PACEMAKER DEVICE OBSERVATION
AL AMPLITUDE: 2.6 mv
BAMS-0001: 150 {beats}/min
RV LEAD IMPEDENCE PM: 537.5 Ohm
RV LEAD THRESHOLD: 1 V

## 2013-05-03 NOTE — Assessment & Plan Note (Signed)
He has healed up nicely and has no evidence of any residual infection.

## 2013-05-03 NOTE — Assessment & Plan Note (Signed)
His St. Jude dual-chamber pacemaker is working normally. He will plan to recheck in several months with his primary cardiologist.

## 2013-05-03 NOTE — Patient Instructions (Addendum)
Your physician recommends that you schedule a follow-up appointment as needed  

## 2013-05-03 NOTE — Assessment & Plan Note (Signed)
I suspect this is related to diastolic heart failure. He is pending evaluation with his primary cardiologist. I've encouraged the patient to reduce his sodium intake and discussed the importance of refraining from eating any salt.

## 2013-05-03 NOTE — Progress Notes (Signed)
HPI Robert Cuevas returns today for followup. He is a very pleasant 79 year old man with complete heart block, dyslipidemia, status post pacemaker insertion, who developed pacemaker pocket infection 1-1/2 years after pacemaker generator change out. He underwent successful extraction several months ago with insertion of a temporary permanent pacemaker, and then underwent insertion of a new permanent pacemaker on the contralateral side. In the interim, he has been stable but does note shortness of breath with exertion. He had admits to dietary indiscretion, stating that he put salt on about everything, and admitting to consuming lots of bacon and sausage. No Known Allergies   Current Outpatient Prescriptions  Medication Sig Dispense Refill  . aspirin EC 81 MG tablet Take 81 mg by mouth daily.      Marland Kitchen atorvastatin (LIPITOR) 20 MG tablet Take 20 mg by mouth daily.        . metoprolol (LOPRESSOR) 50 MG tablet Take 1 tablet (50 mg total) by mouth 2 (two) times daily.      . niacin (SLO-NIACIN) 500 MG tablet Take 1,000 mg by mouth at bedtime.      . Omega-3 Fatty Acids (FISH OIL) 1200 MG CAPS Take 2,400 mg by mouth daily.       No current facility-administered medications for this visit.     Past Medical History  Diagnosis Date  . High cholesterol   . Hypertension   . HOH (hard of hearing)   . Complete heart block     a. s/p gen change 07/2011. b. Pacer pocket infx 02/2013 - s/p extraction, temp perm placement, then eventual implantation of new St. Jude pacemaker 03/04/13.  . Cancer     skin cancer removed from head  . Coronary artery calcification     Seen on CT 02/2013.  Marland Kitchen Hyperglycemia   . Pacemaker- St Jude  03/06/2013    Extracted 5/14 Reimplant 5/14     ROS:   All systems reviewed and negative except as noted in the HPI.   Past Surgical History  Procedure Laterality Date  . Pacemaker insertion    . Inguinal hernia repair Bilateral   . Icd lead removal N/A 03/02/2013    Procedure: ICD  LEAD REMOVAL;  Surgeon: Marinus Maw, MD;  Location: [facility] OR;  Service: Cardiovascular;  Laterality: N/A;  . Pacemaker lead removal N/A 03/02/2013    Procedure: PACEMAKER LEAD REMOVAL;  Surgeon: Marinus Maw, MD;  Location: [facility] OR;  Service: Cardiovascular;  Laterality: N/A;  . Generator removal N/A 03/02/2013    Procedure: GENERATOR REMOVAL;  Surgeon: Marinus Maw, MD;  Location: [facility] OR;  Service: Cardiovascular;  Laterality: N/A;     No family history on file.   History   Social History  . Marital Status: Married    Spouse Name: N/A    Number of Children: N/A  . Years of Education: N/A   Occupational History  . Not on file.   Social History Main Topics  . Smoking status: Never Smoker   . Smokeless tobacco: Not on file  . Alcohol Use: No  . Drug Use: No  . Sexually Active: Not on file   Other Topics Concern  . Not on file   Social History Narrative  . No narrative on file     BP 128/64  Pulse 78  Ht 5\' 8"  (1.727 m)  Wt 137 lb 9.6 oz (62.415 kg)  BMI 20.93 kg/m2  Physical Exam:  Well appearing 79 year old man,NAD HEENT: Unremarkable Neck:  No JVD, no  thyromegally Lungs:  Clear with no wheezes, rales, or rhonchi. HEART:  Regular rate rhythm, no murmurs, no rubs, no clicks Abd:  soft, positive bowel sounds, no organomegally, no rebound, no guarding Ext:  2 plus pulses, no edema, no cyanosis, no clubbing Skin:  No rashes no nodules Neuro:  CN II through XII intact, motor grossly intact  DEVICE  Normal device function.  See PaceArt for details.   Assess/Plan:

## 2013-05-03 NOTE — Assessment & Plan Note (Signed)
He has no ventricular escape rhythm today with his pacemaker turned down to 30 beats per minute.

## 2013-05-06 ENCOUNTER — Ambulatory Visit (HOSPITAL_COMMUNITY)
Admission: RE | Admit: 2013-05-06 | Discharge: 2013-05-06 | Disposition: A | Discharge status: Home / Self Care | Payer: Medicare Other | Source: Ambulatory Visit | Attending: Cardiovascular Disease | Admitting: Cardiovascular Disease

## 2013-05-06 DIAGNOSIS — R0989 Other specified symptoms and signs involving the circulatory and respiratory systems: Secondary | ICD-10-CM

## 2013-05-09 ENCOUNTER — Ambulatory Visit (HOSPITAL_COMMUNITY): Payer: Medicare Other

## 2013-05-10 ENCOUNTER — Encounter: Payer: Self-pay | Admitting: Cardiovascular Disease

## 2013-06-08 ENCOUNTER — Encounter: Payer: Medicare Other | Admitting: Internal Medicine

## 2013-06-21 ENCOUNTER — Ambulatory Visit (HOSPITAL_COMMUNITY)
Admission: RE | Admit: 2013-06-21 | Discharge: 2013-06-21 | Disposition: A | Discharge status: Home / Self Care | Payer: Medicare Other | Source: Ambulatory Visit | Attending: Cardiovascular Disease | Admitting: Cardiovascular Disease

## 2013-06-21 ENCOUNTER — Other Ambulatory Visit (HOSPITAL_COMMUNITY): Payer: Self-pay | Admitting: Cardiovascular Disease

## 2013-06-21 DIAGNOSIS — R0989 Other specified symptoms and signs involving the circulatory and respiratory systems: Secondary | ICD-10-CM

## 2013-06-21 NOTE — Progress Notes (Signed)
Carotid Duplex Completed. °Brianna L Mazza,RVT °

## 2013-07-13 ENCOUNTER — Other Ambulatory Visit: Payer: Self-pay | Admitting: Cardiovascular Disease

## 2013-07-13 ENCOUNTER — Other Ambulatory Visit (HOSPITAL_COMMUNITY): Payer: Self-pay | Admitting: Cardiovascular Disease

## 2013-07-13 DIAGNOSIS — R0602 Shortness of breath: Secondary | ICD-10-CM

## 2013-07-13 LAB — COMPREHENSIVE METABOLIC PANEL
CO2: 26 mEq/L (ref 19–32)
Glucose, Bld: 117 mg/dL — ABNORMAL HIGH (ref 70–99)
Sodium: 137 mEq/L (ref 135–145)
Total Bilirubin: 1.7 mg/dL — ABNORMAL HIGH (ref 0.3–1.2)
Total Protein: 7.3 g/dL (ref 6.0–8.3)

## 2013-07-13 LAB — CBC WITH DIFFERENTIAL/PLATELET
Eosinophils Absolute: 0.2 10*3/uL (ref 0.0–0.7)
Eosinophils Relative: 3 % (ref 0–5)
HCT: 47.9 % (ref 39.0–52.0)
Hemoglobin: 17.1 g/dL — ABNORMAL HIGH (ref 13.0–17.0)
Lymphs Abs: 1.5 10*3/uL (ref 0.7–4.0)
MCH: 31.8 pg (ref 26.0–34.0)
MCV: 89 fL (ref 78.0–100.0)
Monocytes Absolute: 1.3 10*3/uL — ABNORMAL HIGH (ref 0.1–1.0)
Monocytes Relative: 16 % — ABNORMAL HIGH (ref 3–12)
Neutrophils Relative %: 62 % (ref 43–77)
RBC: 5.38 MIL/uL (ref 4.22–5.81)

## 2013-07-13 LAB — PACEMAKER DEVICE OBSERVATION

## 2013-07-15 ENCOUNTER — Encounter: Payer: Self-pay | Admitting: Cardiovascular Disease

## 2013-07-18 ENCOUNTER — Ambulatory Visit (HOSPITAL_COMMUNITY)
Admission: RE | Admit: 2013-07-18 | Discharge: 2013-07-18 | Disposition: A | Discharge status: Home / Self Care | Payer: Medicare Other | Source: Ambulatory Visit | Attending: Cardiovascular Disease | Admitting: Cardiovascular Disease

## 2013-07-18 DIAGNOSIS — R0602 Shortness of breath: Secondary | ICD-10-CM | POA: Insufficient documentation

## 2013-07-18 NOTE — Progress Notes (Signed)
2D Echo Performed 07/18/2013    Tymesha Ditmore, RCS  

## 2013-07-19 ENCOUNTER — Telehealth: Payer: Self-pay | Admitting: Cardiovascular Disease

## 2013-07-19 NOTE — Telephone Encounter (Signed)
Would like to know his Echo results from yesterday-he is real sob.

## 2013-07-19 NOTE — Telephone Encounter (Signed)
Pt.called ECHO results not ready yet pt. informed

## 2013-07-19 NOTE — Telephone Encounter (Signed)
Per Larita Fife in Medical Records, chart taken to [facility], LPN.  Message forwarded to [facility]. Berlinda Last, LPN.

## 2013-10-17 ENCOUNTER — Ambulatory Visit (INDEPENDENT_AMBULATORY_CARE_PROVIDER_SITE_OTHER): Payer: Medicare Other

## 2013-10-17 DIAGNOSIS — R000 Tachycardia, unspecified: Secondary | ICD-10-CM

## 2013-10-17 DIAGNOSIS — I442 Atrioventricular block, complete: Secondary | ICD-10-CM

## 2013-10-28 ENCOUNTER — Encounter: Payer: Self-pay | Admitting: *Deleted

## 2013-10-28 LAB — MDC_IDC_ENUM_SESS_TYPE_REMOTE
Battery Voltage: 2.98 V
Brady Statistic RA Percent Paced: 96 %
Brady Statistic RV Percent Paced: 99 %
Implantable Pulse Generator Model: 2210
Implantable Pulse Generator Serial Number: 7474476
Lead Channel Impedance Value: 390 Ohm
Lead Channel Impedance Value: 410 Ohm
Lead Channel Pacing Threshold Amplitude: 0.625 V
Lead Channel Pacing Threshold Amplitude: 0.75 V
Lead Channel Pacing Threshold Pulse Width: 0.5 ms
Lead Channel Pacing Threshold Pulse Width: 0.5 ms
Lead Channel Sensing Intrinsic Amplitude: 2 mV
Lead Channel Setting Pacing Amplitude: 1 V
Lead Channel Setting Pacing Amplitude: 1.625
Lead Channel Setting Pacing Pulse Width: 0.5 ms
Lead Channel Setting Sensing Sensitivity: 6 mV

## 2013-12-14 ENCOUNTER — Ambulatory Visit: Payer: Medicare Other | Admitting: Cardiovascular Disease

## 2013-12-15 ENCOUNTER — Encounter: Payer: Medicare HMO | Admitting: Cardiovascular Disease

## 2014-01-03 ENCOUNTER — Encounter: Payer: Self-pay | Admitting: Cardiovascular Disease

## 2014-01-03 ENCOUNTER — Ambulatory Visit (INDEPENDENT_AMBULATORY_CARE_PROVIDER_SITE_OTHER): Payer: Medicare HMO | Admitting: Cardiovascular Disease

## 2014-01-03 VITALS — BP 148/80 | HR 70 | Ht 68.0 in | Wt 142.6 lb

## 2014-01-03 DIAGNOSIS — I442 Atrioventricular block, complete: Secondary | ICD-10-CM

## 2014-01-03 DIAGNOSIS — Z950 Presence of cardiac pacemaker: Secondary | ICD-10-CM

## 2014-01-03 LAB — MDC_IDC_ENUM_SESS_TYPE_INCLINIC
Battery Remaining Longevity: 100.8 mo
Battery Voltage: 2.98 V
Implantable Pulse Generator Model: 2210
Implantable Pulse Generator Serial Number: 7474476
Lead Channel Pacing Threshold Amplitude: 0.625 V
Lead Channel Pacing Threshold Pulse Width: 0.5 ms
Lead Channel Setting Pacing Amplitude: 1.5 V
Lead Channel Setting Pacing Pulse Width: 0.5 ms
Lead Channel Setting Sensing Sensitivity: 6 mV
MDC IDC MSMT LEADCHNL RA IMPEDANCE VALUE: 437.5 Ohm
MDC IDC MSMT LEADCHNL RA PACING THRESHOLD AMPLITUDE: 0.5 V
MDC IDC MSMT LEADCHNL RA PACING THRESHOLD PULSEWIDTH: 0.5 ms
MDC IDC MSMT LEADCHNL RA SENSING INTR AMPL: 2.1 mV
MDC IDC MSMT LEADCHNL RV IMPEDANCE VALUE: 437.5 Ohm
MDC IDC SESS DTM: 20150317133945
MDC IDC SET LEADCHNL RV PACING AMPLITUDE: 0.875
MDC IDC STAT BRADY RA PERCENT PACED: 96 %
MDC IDC STAT BRADY RV PERCENT PACED: 99.99 %

## 2014-01-03 LAB — PACEMAKER DEVICE OBSERVATION

## 2014-01-03 NOTE — Patient Instructions (Signed)
Remote monitoring is used to monitor your Pacemaker or ICD from home. This monitoring reduces the number of office visits required to check your device to one time per year. It allows [place] to keep an eye on the functioning of your device to ensure it is working properly. You are scheduled for a device check from home on April 06, 2014. You may send your transmission at any time that day. If you have a wireless device, the transmission will be sent automatically. After your physician reviews your transmission, you will receive a postcard with your next transmission date.  Your physician recommends that you schedule a follow-up appointment in: ONE YEAR with Dr. Sallyanne Kuster.

## 2014-01-03 NOTE — Progress Notes (Signed)
Patient ID: Robert Cuevas, male   DOB: 12-31-1929, 79 y.o.   MRN: [id number]      Reason for office visit Pacemaker followup  Robert Cuevas is a long-term patient of Dr. Terance Ice and he is here to establish new cardiology followup. I first met him roughly a year ago when he presented with a sterile erosion of his pacemaker pocket that required removal of the device and chronic leads and replacement with a new right subclavian device. He has a history of complete heart block. He has not had any problems in the surgical site but notes that he has a little more dyspnea since his last surgery. 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  He has hypertension, hyperlipidemia and diet-controlled type 2 diabetes mellitus but no known coronary disease and a normal nuclear stress test in 2012 (pacing-induced artifact only). He has preserved left ventricular systolic function and no significant valvular abnormalities.  Pacemaker interrogation today shows normal device function with virtually 100% atrial pacing and ventricular pacing in the setting of complete heart block. Heart rate histogram distribution shows favorable findings, without blunting. No meaningful tachyarrhythmia has been recorded.   No Known Allergies  Current Outpatient Prescriptions  Medication Sig Dispense Refill  . aspirin EC 81 MG tablet Take 81 mg by mouth daily.      Marland Kitchen atorvastatin (LIPITOR) 20 MG tablet Take 20 mg by mouth daily.        . metoprolol (LOPRESSOR) 50 MG tablet Take 1 tablet (50 mg total) by mouth 2 (two) times daily.      . Omega-3 Fatty Acids (FISH OIL) 1200 MG CAPS Take 2,400 mg by mouth daily.       No current facility-administered medications for this visit.    Past Medical History  Diagnosis Date  . High cholesterol   . Hypertension   . HOH (hard of hearing)   . Complete heart block     a. s/p  gen change 07/2011. b. Pacer pocket infx 02/2013 - s/p extraction, temp perm placement, then eventual implantation of new St. Jude pacemaker 03/04/13.  . Cancer     skin cancer removed from head  . Coronary artery calcification     Seen on CT 02/2013.  Marland Kitchen Hyperglycemia   . Pacemaker- St Jude  03/06/2013    Extracted 5/14 Reimplant 5/14     Past Surgical History  Procedure Laterality Date  . Pacemaker insertion    . Inguinal hernia repair Bilateral   . Icd lead removal N/A 03/02/2013    Procedure: ICD LEAD REMOVAL;  Surgeon: Evans Lance, MD;  Location: [place];  Service: Cardiovascular;  Laterality: N/A;  . Pacemaker lead removal N/A 03/02/2013    Procedure: PACEMAKER LEAD REMOVAL;  Surgeon: Evans Lance, MD;  Location: [place];  Service: Cardiovascular;  Laterality: N/A;  . Generator removal N/A 03/02/2013    Procedure: GENERATOR REMOVAL;  Surgeon: Evans Lance, MD;  Location: [place];  Service: Cardiovascular;  Laterality: N/A;    No family history on file.  History   Social History  . Marital Status: Married    Spouse Name: N/A    Number of Children: N/A  . Years of Education: N/A   Occupational History  . Not on file.   Social History Main Topics  . Smoking status: Never Smoker   . Smokeless tobacco: Not on file  .  Alcohol Use: No  . Drug Use: No  . Sexual Activity: Not on file   Other Topics Concern  . Not on file   Social History Narrative  . No narrative on file    Review of systems: The patient specifically denies any chest pain at rest or with exertion, dyspnea at rest or with usual exertion, orthopnea, paroxysmal nocturnal dyspnea, syncope, palpitations, focal neurological deficits, intermittent claudication, lower extremity edema, unexplained weight gain, cough, hemoptysis or wheezing.  The patient also denies abdominal pain, nausea, vomiting, dysphagia, diarrhea, constipation, polyuria, polydipsia, dysuria, hematuria, frequency, urgency, abnormal bleeding or  bruising, fever, chills, unexpected weight changes, mood swings, change in skin or hair texture, change in voice quality, auditory or visual problems, allergic reactions or rashes, new musculoskeletal complaints other than usual "aches and pains".   PHYSICAL EXAM BP 148/80  Pulse 70  Ht 5' 8" (1.727 m)  Wt 64.683 kg (142 lb 9.6 oz)  BMI 21.69 kg/m2  General: Alert, oriented x3, no distress Head: no evidence of trauma, PERRL, EOMI, no exophtalmos or lid lag, no myxedema, no xanthelasma; normal ears, nose and oropharynx Neck: normal jugular venous pulsations and no hepatojugular reflux; brisk carotid pulses without delay and no carotid bruits Chest: clear to auscultation, no signs of consolidation by percussion or palpation, normal fremitus, symmetrical and full respiratory excursions Both the old left subclavian scar and then the right subclavian pacer site appeared healthy Cardiovascular: normal position and quality of the apical impulse, regular rhythm, normal first and second heart sounds, no murmurs, rubs or gallops Abdomen: no tenderness or distention, no masses by palpation, no abnormal pulsatility or arterial bruits, normal bowel sounds, no hepatosplenomegaly Extremities: no clubbing, cyanosis or edema; 2+ radial, ulnar and brachial pulses bilaterally; 2+ right femoral, posterior tibial and dorsalis pedis pulses; 2+ left femoral, posterior tibial and dorsalis pedis pulses; no subclavian or femoral bruits Neurological: grossly nonfocal   EKG: Atrial ventricular sequential pacing  Lipid Panel  Cholesterol 102, triglycerides 80, HDL 30, LDL 56, hemoglobin A1c 7.4%, creatinine 0.7, hemoglobin 16.9, normal transaminases with borderline elevation of alkaline phosphatase  BMET    Component Value Date/Time   NA 137 07/13/2013 1025   K 4.8 07/13/2013 1025   CL 105 07/13/2013 1025   CO2 26 07/13/2013 1025   GLUCOSE 117* 07/13/2013 1025   BUN 24* 07/13/2013 1025   CREATININE 0.92 07/13/2013  1025   CREATININE 0.68 03/07/2013 0505   CALCIUM 9.6 07/13/2013 1025   GFRNONAA 87* 03/07/2013 0505   GFRAA >90 03/07/2013 0505     ASSESSMENT AND PLAN Blood pressure and cholesterol levels are well controlled. His hemoglobin A1c is slightly higher than desired but it is probably not worth it to start pharmacological therapy in this active 80 year old man without complications of diabetes.  His pacemaker interrogation shows normal device function. He does describe shortness of breath but is remarkably active and has excellent functional status objectively. Echo performed after his device changed out still showed normal LVEF at about 50-55%. I reassured him that from a structural standpoint his heart is doing well and I do not see a reason to change any of the settings on his pacemaker. CRT does not appear to be justified.  Patient Instructions  Remote monitoring is used to monitor your Pacemaker or ICD from home. This monitoring reduces the number of office visits required to check your device to one time per year. It allows [place] to keep an eye on the functioning of your device  to ensure it is working properly. You are scheduled for a device check from home on April 06, 2014. You may send your transmission at any time that day. If you have a wireless device, the transmission will be sent automatically. After your physician reviews your transmission, you will receive a postcard with your next transmission date.  Your physician recommends that you schedule a follow-up appointment in: ONE YEAR with Dr. Sallyanne Kuster.         Holli Humbles, MD, [place] [phone] office [phone] pager

## 2014-04-06 ENCOUNTER — Ambulatory Visit (INDEPENDENT_AMBULATORY_CARE_PROVIDER_SITE_OTHER): Payer: Medicare HMO | Admitting: *Deleted

## 2014-04-06 ENCOUNTER — Encounter: Payer: Self-pay | Admitting: Cardiovascular Disease

## 2014-04-06 DIAGNOSIS — I442 Atrioventricular block, complete: Secondary | ICD-10-CM

## 2014-04-06 DIAGNOSIS — Z950 Presence of cardiac pacemaker: Secondary | ICD-10-CM

## 2014-04-06 NOTE — Progress Notes (Signed)
Remote pacemaker transmission.   

## 2014-04-10 LAB — MDC_IDC_ENUM_SESS_TYPE_REMOTE
Battery Remaining Longevity: 99 mo
Implantable Pulse Generator Serial Number: 7474476
Lead Channel Impedance Value: 430 Ohm
Lead Channel Impedance Value: 440 Ohm
Lead Channel Pacing Threshold Amplitude: 0.5 V
Lead Channel Pacing Threshold Pulse Width: 0.5 ms
Lead Channel Setting Sensing Sensitivity: 6 mV
MDC IDC MSMT BATTERY VOLTAGE: 2.98 V
MDC IDC MSMT LEADCHNL RA SENSING INTR AMPL: 2 mV
MDC IDC MSMT LEADCHNL RV PACING THRESHOLD AMPLITUDE: 0.625 V
MDC IDC MSMT LEADCHNL RV PACING THRESHOLD PULSEWIDTH: 0.5 ms
MDC IDC SET LEADCHNL RA PACING AMPLITUDE: 1.5 V
MDC IDC SET LEADCHNL RV PACING AMPLITUDE: 0.875
MDC IDC SET LEADCHNL RV PACING PULSEWIDTH: 0.5 ms
MDC IDC STAT BRADY RA PERCENT PACED: 97 %
MDC IDC STAT BRADY RV PERCENT PACED: 100 %

## 2014-05-03 ENCOUNTER — Encounter: Payer: Self-pay | Admitting: Cardiology

## 2014-07-10 ENCOUNTER — Encounter: Payer: Self-pay | Admitting: Cardiovascular Disease

## 2014-07-10 ENCOUNTER — Ambulatory Visit (INDEPENDENT_AMBULATORY_CARE_PROVIDER_SITE_OTHER): Payer: Medicare HMO | Admitting: *Deleted

## 2014-07-10 DIAGNOSIS — I442 Atrioventricular block, complete: Secondary | ICD-10-CM

## 2014-07-10 LAB — MDC_IDC_ENUM_SESS_TYPE_REMOTE
Battery Remaining Longevity: 103 mo
Battery Remaining Percentage: 91 %
Battery Voltage: 2.98 V
Brady Statistic AP VP Percent: 98 %
Brady Statistic RA Percent Paced: 98 %
Brady Statistic RV Percent Paced: 99 %
Date Time Interrogation Session: 20150921060016
Implantable Pulse Generator Model: 2210
Lead Channel Impedance Value: 410 Ohm
Lead Channel Pacing Threshold Amplitude: 0.5 V
Lead Channel Pacing Threshold Amplitude: 0.625 V
Lead Channel Pacing Threshold Pulse Width: 0.5 ms
Lead Channel Sensing Intrinsic Amplitude: 2.3 mV
Lead Channel Setting Pacing Amplitude: 0.875
MDC IDC MSMT LEADCHNL RV IMPEDANCE VALUE: 440 Ohm
MDC IDC MSMT LEADCHNL RV PACING THRESHOLD PULSEWIDTH: 0.5 ms
MDC IDC PG SERIAL: 7474476
MDC IDC SET LEADCHNL RA PACING AMPLITUDE: 1.5 V
MDC IDC SET LEADCHNL RV PACING PULSEWIDTH: 0.5 ms
MDC IDC SET LEADCHNL RV SENSING SENSITIVITY: 6 mV
MDC IDC STAT BRADY AP VS PERCENT: 1 %
MDC IDC STAT BRADY AS VP PERCENT: 2.1 %
MDC IDC STAT BRADY AS VS PERCENT: 1 %

## 2014-07-10 NOTE — Progress Notes (Signed)
Remote pacemaker transmission.   

## 2014-07-17 ENCOUNTER — Encounter: Payer: Self-pay | Admitting: Cardiology

## 2014-08-30 ENCOUNTER — Telehealth: Payer: Self-pay | Admitting: Cardiovascular Disease

## 2014-08-30 MED ORDER — ATORVASTATIN CALCIUM 20 MG PO TABS: 20.0000 mg | ORAL_TABLET | Freq: Every day | ORAL | Status: DC

## 2014-08-30 NOTE — Telephone Encounter (Signed)
Malachy Mood called in inquiring about the pt's prescription for Atorvastatin. She would like to know if Dr. Loletha Grayer is wanting him to stay on this medication. Please call  Thanks

## 2014-08-30 NOTE — Telephone Encounter (Signed)
Spoke with Malachy Mood - she states that patient last refill was June for #90  Rx sent to pharmacy

## 2014-09-25 ENCOUNTER — Encounter: Payer: Self-pay | Admitting: Cardiovascular Disease

## 2014-09-27 ENCOUNTER — Encounter (HOSPITAL_COMMUNITY): Payer: Self-pay | Admitting: Cardiovascular Disease

## 2014-09-28 ENCOUNTER — Encounter (HOSPITAL_COMMUNITY): Payer: Self-pay | Admitting: Internal Medicine

## 2014-10-10 ENCOUNTER — Telehealth: Payer: Self-pay | Admitting: Cardiology

## 2014-10-10 ENCOUNTER — Ambulatory Visit (INDEPENDENT_AMBULATORY_CARE_PROVIDER_SITE_OTHER): Payer: Medicare HMO | Admitting: *Deleted

## 2014-10-10 DIAGNOSIS — I442 Atrioventricular block, complete: Secondary | ICD-10-CM

## 2014-10-10 NOTE — Progress Notes (Signed)
Remote pacemaker transmission.   

## 2014-10-10 NOTE — Telephone Encounter (Signed)
LMOVM reminding pt to send remote transmission.   

## 2014-10-14 LAB — MDC_IDC_ENUM_SESS_TYPE_REMOTE
Battery Remaining Longevity: 103 mo
Battery Remaining Percentage: 91 %
Brady Statistic AP VS Percent: 1 %
Brady Statistic AS VS Percent: 1 %
Brady Statistic RA Percent Paced: 98 %
Brady Statistic RV Percent Paced: 99 %
Date Time Interrogation Session: 20151222073630
Implantable Pulse Generator Model: 2210
Implantable Pulse Generator Serial Number: 7474476
Lead Channel Impedance Value: 410 Ohm
Lead Channel Pacing Threshold Amplitude: 0.75 V
Lead Channel Sensing Intrinsic Amplitude: 2.4 mV
Lead Channel Setting Pacing Pulse Width: 0.5 ms
Lead Channel Setting Sensing Sensitivity: 6 mV
MDC IDC MSMT BATTERY VOLTAGE: 2.98 V
MDC IDC MSMT LEADCHNL RA PACING THRESHOLD AMPLITUDE: 0.5 V
MDC IDC MSMT LEADCHNL RA PACING THRESHOLD PULSEWIDTH: 0.5 ms
MDC IDC MSMT LEADCHNL RV IMPEDANCE VALUE: 450 Ohm
MDC IDC MSMT LEADCHNL RV PACING THRESHOLD PULSEWIDTH: 0.5 ms
MDC IDC SET LEADCHNL RA PACING AMPLITUDE: 1.5 V
MDC IDC SET LEADCHNL RV PACING AMPLITUDE: 1 V
MDC IDC STAT BRADY AP VP PERCENT: 98 %
MDC IDC STAT BRADY AS VP PERCENT: 2 %

## 2014-10-23 ENCOUNTER — Encounter: Payer: Self-pay | Admitting: Cardiology

## 2014-11-01 ENCOUNTER — Encounter: Payer: Self-pay | Admitting: Cardiovascular Disease

## 2015-01-02 ENCOUNTER — Ambulatory Visit (INDEPENDENT_AMBULATORY_CARE_PROVIDER_SITE_OTHER): Payer: Medicare HMO | Admitting: Cardiovascular Disease

## 2015-01-02 ENCOUNTER — Encounter: Payer: Self-pay | Admitting: Cardiovascular Disease

## 2015-01-02 VITALS — BP 154/72 | HR 70 | Resp 16 | Ht 68.0 in | Wt 144.0 lb

## 2015-01-02 DIAGNOSIS — I480 Paroxysmal atrial fibrillation: Secondary | ICD-10-CM | POA: Diagnosis not present

## 2015-01-02 DIAGNOSIS — I4891 Unspecified atrial fibrillation: Secondary | ICD-10-CM

## 2015-01-02 DIAGNOSIS — I442 Atrioventricular block, complete: Secondary | ICD-10-CM

## 2015-01-02 DIAGNOSIS — Z950 Presence of cardiac pacemaker: Secondary | ICD-10-CM

## 2015-01-02 DIAGNOSIS — R0602 Shortness of breath: Secondary | ICD-10-CM

## 2015-01-02 LAB — MDC_IDC_ENUM_SESS_TYPE_INCLINIC
Battery Remaining Longevity: 109.2 mo
Brady Statistic RV Percent Paced: 99.99 %
Date Time Interrogation Session: 20160315135316
Implantable Pulse Generator Model: 2210
Implantable Pulse Generator Serial Number: 7474476
Lead Channel Impedance Value: 412.5 Ohm
Lead Channel Pacing Threshold Amplitude: 0.5 V
Lead Channel Pacing Threshold Amplitude: 0.625 V
Lead Channel Pacing Threshold Pulse Width: 0.5 ms
Lead Channel Setting Pacing Amplitude: 0.875
MDC IDC MSMT BATTERY VOLTAGE: 2.98 V
MDC IDC MSMT LEADCHNL RA SENSING INTR AMPL: 2.1 mV
MDC IDC MSMT LEADCHNL RV IMPEDANCE VALUE: 437.5 Ohm
MDC IDC MSMT LEADCHNL RV PACING THRESHOLD PULSEWIDTH: 0.5 ms
MDC IDC SET LEADCHNL RA PACING AMPLITUDE: 1.5 V
MDC IDC SET LEADCHNL RV PACING PULSEWIDTH: 0.5 ms
MDC IDC SET LEADCHNL RV SENSING SENSITIVITY: 6 mV
MDC IDC STAT BRADY RA PERCENT PACED: 98 %

## 2015-01-02 MED ORDER — ATORVASTATIN CALCIUM 20 MG PO TABS: 20.0000 mg | ORAL_TABLET | Freq: Every day | ORAL | Status: DC

## 2015-01-02 MED ORDER — RIVAROXABAN 20 MG PO TABS: 20.0000 mg | ORAL_TABLET | Freq: Every day | ORAL | Status: DC

## 2015-01-02 NOTE — Patient Instructions (Signed)
START Xarelto 20mg  daily with food.  STOP Aspirin.  Your physician has requested that you have an echocardiogram. Echocardiography is a painless test that uses sound waves to create images of your heart. It provides your doctor with information about the size and shape of your heart and how well your heart's chambers and valves are working. This procedure takes approximately one hour. There are no restrictions for this procedure.  Dr. Sallyanne Kuster recommends that you schedule a follow-up appointment in: after the Echo next available.  Remote monitoring is used to monitor your Pacemaker or ICD from home. This monitoring reduces the number of office visits required to check your device to one time per year. It allows [place] to monitor the functioning of your device to ensure it is working properly. You are scheduled for a device check from home on April 05, 2015. You may send your transmission at any time that day. If you have a wireless device, the transmission will be sent automatically. After your physician reviews your transmission, you will receive a postcard with your next transmission date.

## 2015-01-03 ENCOUNTER — Telehealth: Payer: Self-pay | Admitting: Cardiovascular Disease

## 2015-01-03 MED ORDER — RIVAROXABAN 20 MG PO TABS: 20.0000 mg | ORAL_TABLET | Freq: Every day | ORAL | Status: DC

## 2015-01-03 NOTE — Telephone Encounter (Signed)
Advised caller no generic available. Provided samples & discount card, left at front desk. She voiced understanding.

## 2015-01-03 NOTE — Telephone Encounter (Signed)
Pt wants to know if there is a generic for Xarelto? If so,he would like the generic.

## 2015-01-04 ENCOUNTER — Encounter: Payer: Self-pay | Admitting: Cardiovascular Disease

## 2015-01-04 DIAGNOSIS — I480 Paroxysmal atrial fibrillation: Secondary | ICD-10-CM

## 2015-01-04 HISTORY — DX: Paroxysmal atrial fibrillation: I48.0

## 2015-01-04 NOTE — Progress Notes (Signed)
Patient ID: Robert Cuevas, male   DOB: Sep 23, 1930, 81 y.o.   MRN: [id number]      Cardiology Office Note   Date:  01/04/2015   ID:  Robert Cuevas, DOB 11/16/1929, MRN [id number]  PCP:  Charletta Cousin, MD  Cardiologist:   Sanda Klein, MD   Chief Complaint  Patient presents with  . Annual Exam    c/o SOB      History of Present Illness: Robert Cuevas is a 81 y.o. male who presents for pacemaker follow up and worsening dyspnea.  He has a history of complete heart block. He had a sterile pocket erosion with device extraction and contralateral reimplantation in May 2014. He has not had any problems in the surgical site but notes that he has a little more dyspnea since his last surgery and this is slowly worsening over the last year. Having said that, this 81 year old gentleman still performs pretty intense physical activity for a man his age.  He has hypertension, hyperlipidemia and diet-controlled type 2 diabetes mellitus, coronary calcifications on CT, but no clinical coronary disease and a normal nuclear stress test in 2012 (pacing-induced artifact only). He has preserved left ventricular systolic function and no significant valvular abnormalities.  Pacemaker interrogation today shows normal device function with virtually 100% atrial pacing and ventricular pacing in the setting of complete heart block. Heart rate histogram distribution shows favorable findings, without blunting. The device has also recorded several episodes of paroxysmal atrial fibrillation, all asymptomatic.  Past Medical History  Diagnosis Date  . High cholesterol   . Hypertension   . HOH (hard of hearing)   . Complete heart block     a. s/p gen change 07/2011. b. Pacer pocket infx 02/2013 - s/p extraction, temp perm placement, then eventual implantation of new St. Jude pacemaker 03/04/13.  . Cancer     skin cancer removed from head  . Coronary artery calcification     Seen on CT 02/2013.  Marland Kitchen Hyperglycemia   .  Pacemaker- St Jude  03/06/2013    Extracted 5/14 Reimplant 5/14     Past Surgical History  Procedure Laterality Date  . Pacemaker insertion    . Inguinal hernia repair Bilateral   . Icd lead removal N/A 03/02/2013    Procedure: ICD LEAD REMOVAL;  Surgeon: Evans Lance, MD;  Location: [place];  Service: Cardiovascular;  Laterality: N/A;  . Pacemaker lead removal N/A 03/02/2013    Procedure: PACEMAKER LEAD REMOVAL;  Surgeon: Evans Lance, MD;  Location: [place];  Service: Cardiovascular;  Laterality: N/A;  . Generator removal N/A 03/02/2013    Procedure: GENERATOR REMOVAL;  Surgeon: Evans Lance, MD;  Location: [place];  Service: Cardiovascular;  Laterality: N/A;  . Pacemaker generator change N/A 09/19/2011    Procedure: PACEMAKER GENERATOR CHANGE;  Surgeon: Sanda Klein, MD;  Location: [place] CATH LAB;  Service: Cardiovascular;  Laterality: N/A;  . Permanent pacemaker insertion N/A 03/04/2013    Procedure: PERMANENT PACEMAKER INSERTION;  Surgeon: Evans Lance, MD;  Location: [facility] CATH LAB;  Service: Cardiovascular;  Laterality: N/A;     Current Outpatient Prescriptions  Medication Sig Dispense Refill  . atorvastatin (LIPITOR) 20 MG tablet Take 1 tablet (20 mg total) by mouth daily. 90 tablet 3  . metoprolol (LOPRESSOR) 50 MG tablet Take 1 tablet (50 mg total) by mouth 2 (two) times daily.    . Omega-3 Fatty Acids (FISH OIL) 1200 MG CAPS Take 2,400 mg by mouth daily.    Marland Kitchen  rivaroxaban (XARELTO) 20 MG TABS tablet Take 1 tablet (20 mg total) by mouth daily with supper. 20 tablet 0   No current facility-administered medications for this visit.    Allergies:   Review of patient's allergies indicates no known allergies.    Social History:  The patient  reports that he has never smoked. He does not have any smokeless tobacco history on file. He reports that he does not drink alcohol or use illicit drugs.   Family History:  The patient's family history is not on file.    ROS:  Please see the  history of present illness. Dyspnea with moderate activity. No bleeding problems. The patient specifically denies any chest pain at rest or with exertion, dyspnea at rest or with usual exertion, orthopnea, paroxysmal nocturnal dyspnea, syncope, palpitations, focal neurological deficits, intermittent claudication, lower extremity edema, unexplained weight gain, cough, hemoptysis or wheezing.  The patient also denies abdominal pain, nausea, vomiting, dysphagia, diarrhea, constipation, polyuria, polydipsia, dysuria, hematuria, frequency, urgency, abnormal bleeding or bruising, fever, chills, unexpected weight changes, mood swings, change in skin or hair texture, change in voice quality, auditory or visual problems, allergic reactions or rashes, new musculoskeletal complaints other than usual "aches and pains". Otherwise, review of systems are positive for none.   All other systems are reviewed and negative.    PHYSICAL EXAM: VS:  BP 154/72 mmHg  Pulse 70  Resp 16  Ht 5\' 8"  (1.727 m)  Wt 144 lb (65.318 kg)  BMI 21.90 kg/m2 , BMI Body mass index is 21.9 kg/(m^2). General: Alert, oriented x3, no distress Head: no evidence of trauma, PERRL, EOMI, no exophtalmos or lid lag, no myxedema, no xanthelasma; normal ears, nose and oropharynx Neck: normal jugular venous pulsations and no hepatojugular reflux; brisk carotid pulses without delay and no carotid bruits Chest: clear to auscultation, no signs of consolidation by percussion or palpation, normal fremitus, symmetrical and full respiratory excursions Both the old left subclavian scar and then the right subclavian pacer site appeared healthy Cardiovascular: normal position and quality of the apical impulse, regular rhythm, normal first and second heart sounds, no murmurs, rubs or gallops Abdomen: no tenderness or distention, no masses by palpation, no abnormal pulsatility or arterial bruits, normal bowel sounds, no hepatosplenomegaly Extremities: no  clubbing, cyanosis or edema; 2+ radial, ulnar and brachial pulses bilaterally; 2+ right femoral, posterior tibial and dorsalis pedis pulses; 2+ left femoral, posterior tibial and dorsalis pedis pulses; no subclavian or femoral bruits Neurological: grossly nonfocal Psych: euthymic mood, full affect   EKG:  EKG is ordered today. The ekg ordered today demonstrates Atrial ventricular sequential pacing   Recent Labs: Cholesterol 102, triglycerides 80, HDL 30, LDL 56, hemoglobin A1c 7.4%, creatinine 0.7, hemoglobin 16.9, normal transaminases with borderline elevation of alkaline phosphatase     Wt Readings from Last 3 Encounters:  01/02/15 144 lb (65.318 kg)  01/03/14 142 lb 9.6 oz (64.683 kg)  05/03/13 137 lb 9.6 oz (62.415 kg)     ASSESSMENT AND PLAN:  1. Paroxysmal atrial fibrillation -  New development, he is unaware of the arrhythmia. CHADSVasc is at least 4. Discussed risk of embolic stroke and role of anticoagulants, which are advisable despite increased risk of bleeding. Discussed DOAC versus warfarin. Will start Xarelto.  2. Complete heart block - pacemaker dependent.  3. Dual chamber permanent pacemaker - normal device function.  4. HTN - well controlled  5. Hyperlipidemia and mild DM - good control  6. Exertional dyspnea - he believes it  is worse. Will recheck echo. Physical exam is reassuring.   Current medicines are reviewed at length with the patient today.  The patient does not have concerns regarding medicines.  The following changes have been made:  Start Xarelto, stop ASA  Labs/ tests ordered today include:  Orders Placed This Encounter  Procedures  . EKG 12-Lead  . 2D Echocardiogram without contrast   Patient Instructions  START Xarelto 20mg  daily with food.  STOP Aspirin.  Your physician has requested that you have an echocardiogram. Echocardiography is a painless test that uses sound waves to create images of your heart. It provides your doctor with  information about the size and shape of your heart and how well your heart's chambers and valves are working. This procedure takes approximately one hour. There are no restrictions for this procedure.  Dr. Sallyanne Kuster recommends that you schedule a follow-up appointment in: after the Echo next available.  Remote monitoring is used to monitor your Pacemaker or ICD from home. This monitoring reduces the number of office visits required to check your device to one time per year. It allows [place] to monitor the functioning of your device to ensure it is working properly. You are scheduled for a device check from home on April 05, 2015. You may send your transmission at any time that day. If you have a wireless device, the transmission will be sent automatically. After your physician reviews your transmission, you will receive a postcard with your next transmission date.      Mikael Spray, MD  01/04/2015 10:11 PM     Sanda Klein, MD, [place] [phone] office [phone] pager

## 2015-01-10 ENCOUNTER — Ambulatory Visit (HOSPITAL_COMMUNITY)
Admission: RE | Admit: 2015-01-10 | Discharge: 2015-01-10 | Disposition: A | Discharge status: Home / Self Care | Payer: Medicare HMO | Source: Ambulatory Visit | Attending: Cardiovascular Disease | Admitting: Cardiovascular Disease

## 2015-01-10 DIAGNOSIS — R0602 Shortness of breath: Secondary | ICD-10-CM | POA: Diagnosis not present

## 2015-01-10 DIAGNOSIS — I4891 Unspecified atrial fibrillation: Secondary | ICD-10-CM

## 2015-01-10 NOTE — Progress Notes (Signed)
2D Echo Performed 01/10/2015    Marygrace Drought, RCS

## 2015-01-14 IMAGING — CT CT ANGIO CHEST
2 of 6 series · 17 of 46 positions shown · IV contrast (APPLIED)
Comparison: None.

CLINICAL DATA: Pacemaker extraction.  Possible pneumomediastinum
versus pseudoaneurysm.

CT ANGIOGRAPHY CHEST
TECHNIQUE: Multidetector CT imaging of the chest using the
standard protocol during bolus administration of intravenous
contrast. Multiplanar reconstructed images including MIPs were
obtained and reviewed to evaluate the vascular anatomy.
Contrast: 100mL OMNIPAQUE IOHEXOL 350 MG/ML SOLN

[Series 5: dissection 2.0 st · axial · 0.63mm/px · z∈[-198,+16]mm · 14 of 119 slices shown]
[im 6/119  lung]
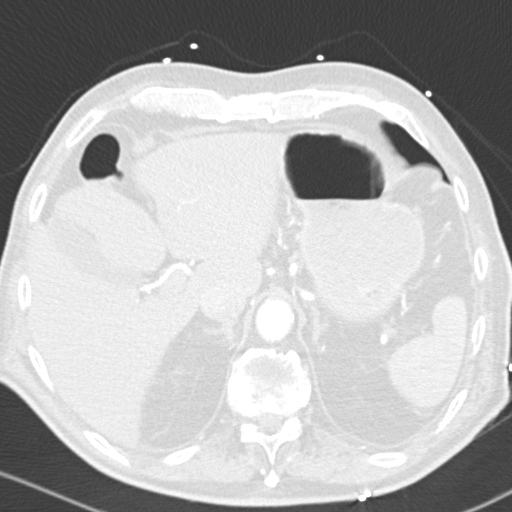
[im 17/119  soft-tissue]
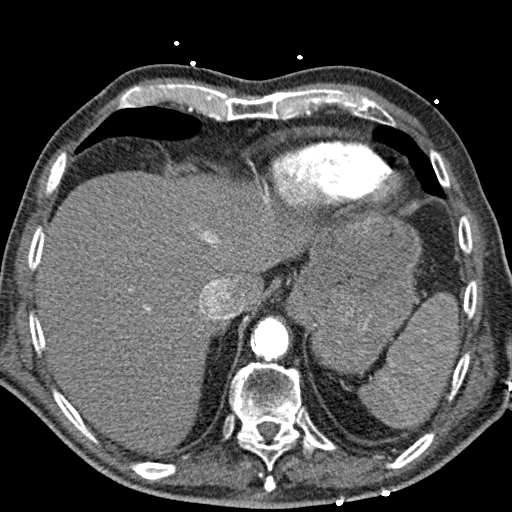
[im 22/119  lung]
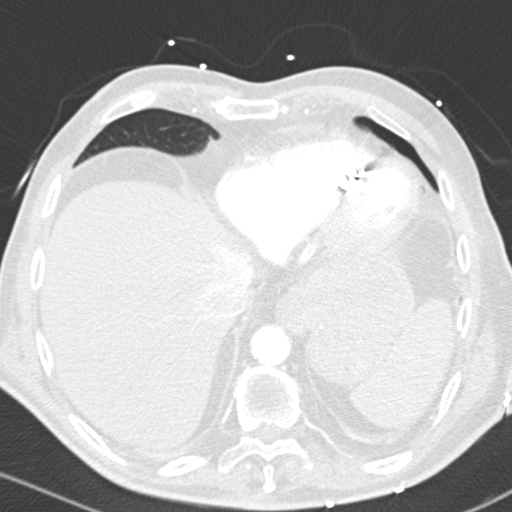
[im 33/119  soft-tissue]
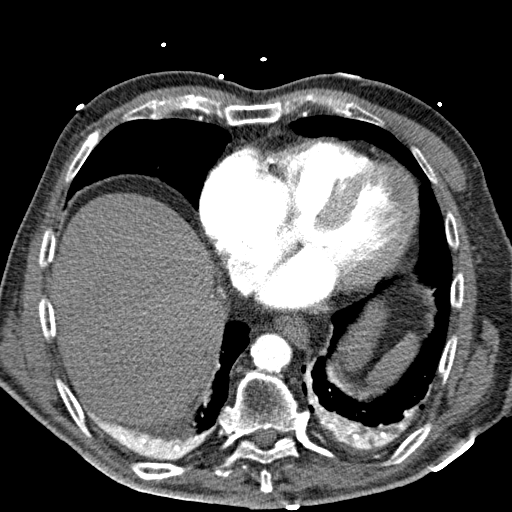
[im 38/119  lung]
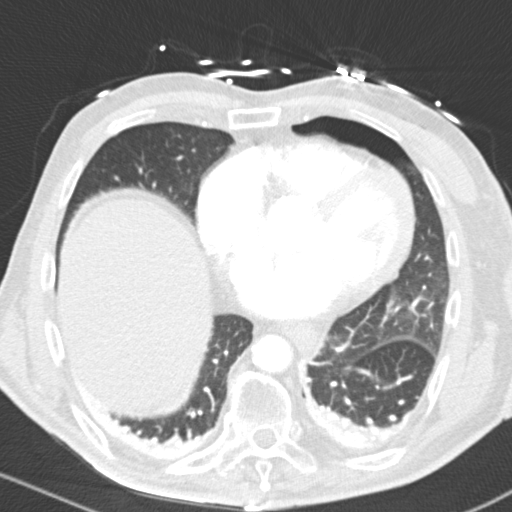
[im 49/119  soft-tissue]
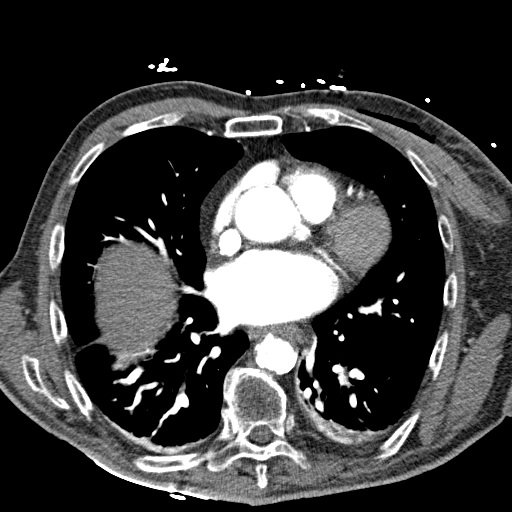
[im 54/119  lung]
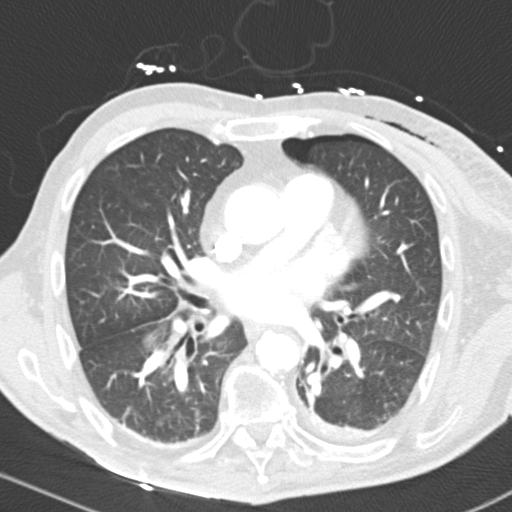
[im 65/119  soft-tissue]
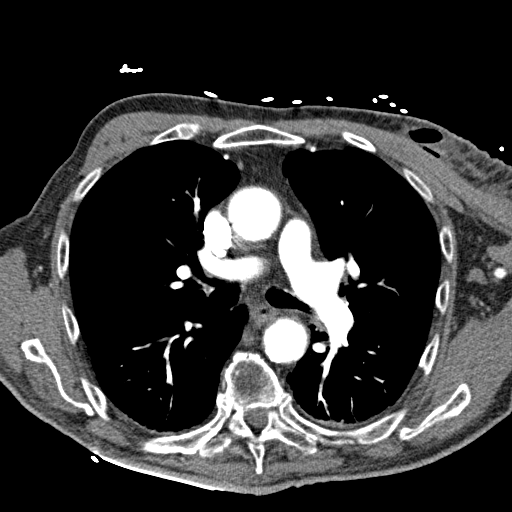
[im 70/119  lung]
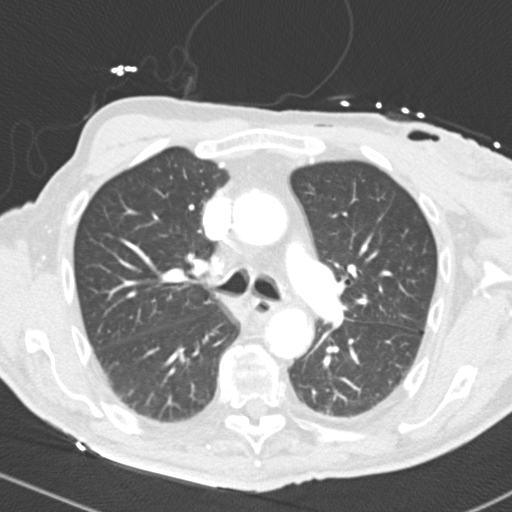
[im 81/119  soft-tissue]
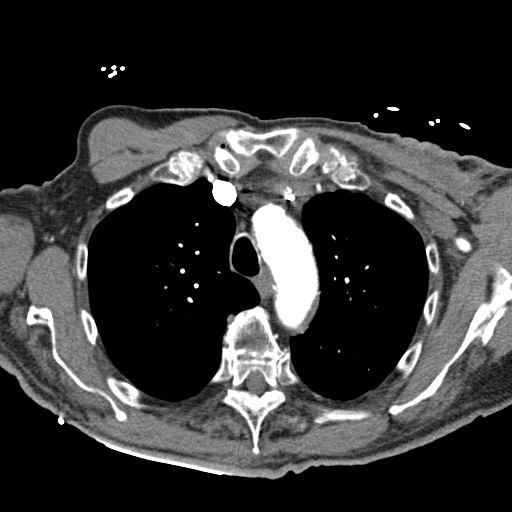
[im 86/119  lung]
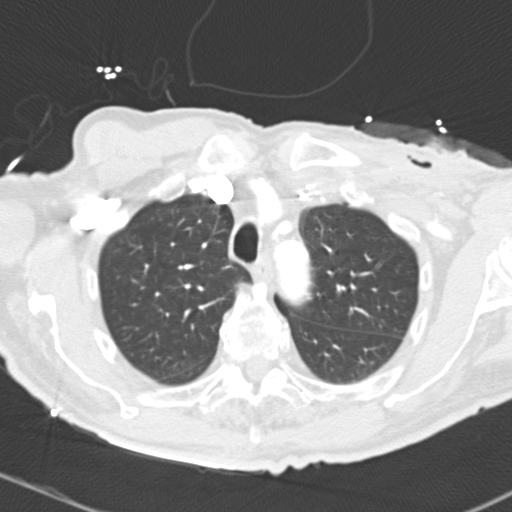
[im 97/119  soft-tissue]
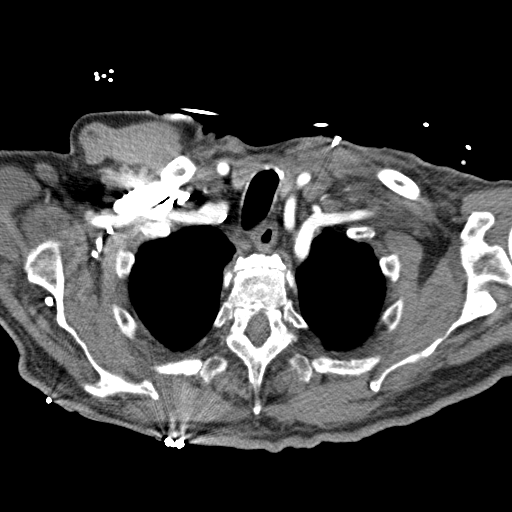
[im 102/119  lung]
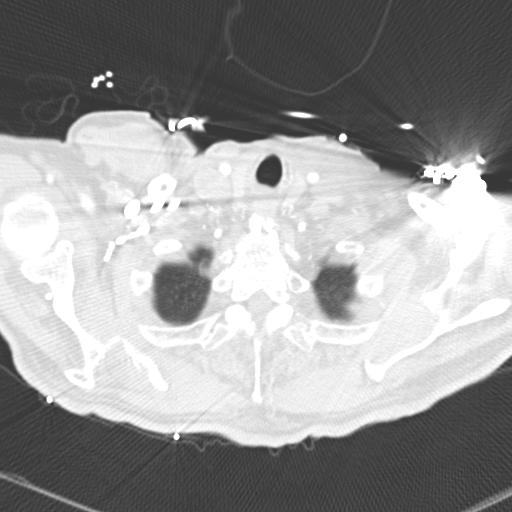
[im 113/119  soft-tissue]
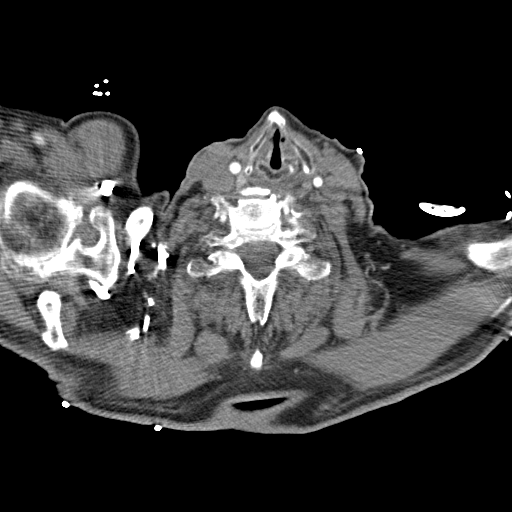

[Series 8: coronals · coronal · 0.66mm/px · 3 of 130 slices shown]
[im 33/130  soft-tissue]
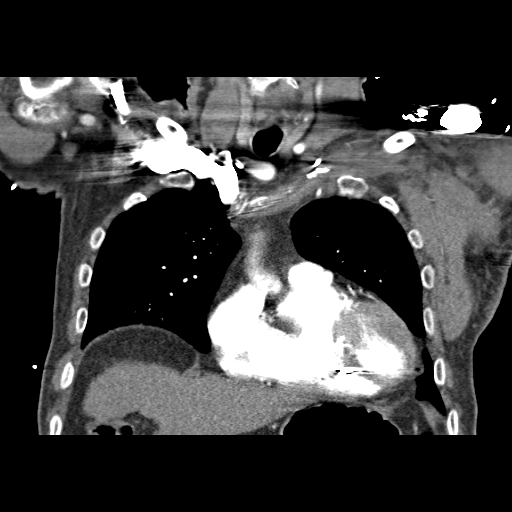
[im 65/130  soft-tissue]
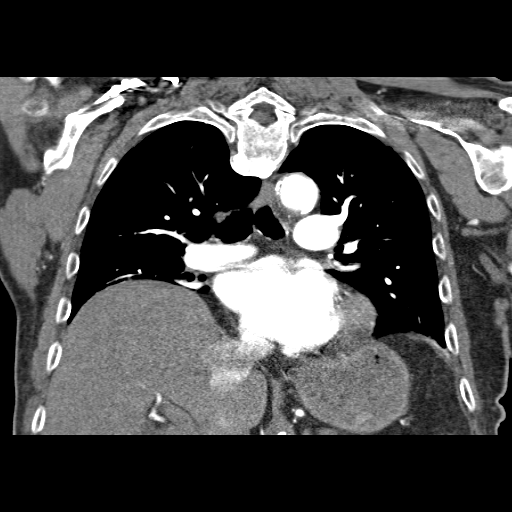
[im 97/130  soft-tissue]
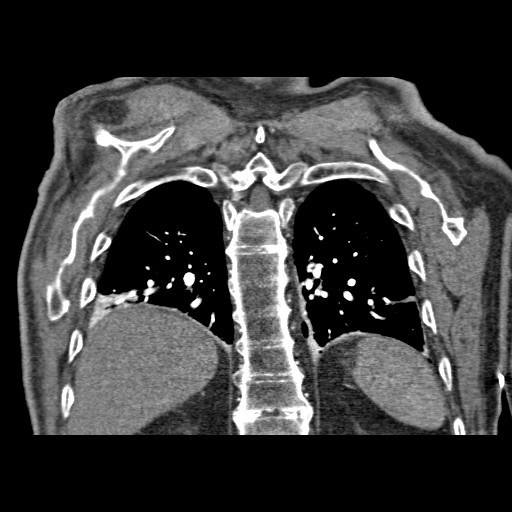

[17 of 46 positions shown; findings below may reference images not displayed]

FINDINGS: Noncontrast images demonstrate no evidence of intramural
hematoma.  Atherosclerotic faster calcifications involving the
entire thoracic aorta are present.  Prominent aortic valve
calcifications.  The left main and three-vessel coronary artery
calcifications.

A left internal jugular percutaneous pacemaker lead traverses into
the central venous system and is within the right ventricle.  A
second lead is also in place with its tip at the right ventricle
apex. There are calcifications about the lead within the left
innominate vein.  The proximal extent is in the left innominate
vein. There is no evidence of pseudoaneurysm in the left neck or
left supraclavicular region.

Maximal diameters of the ascending aorta at the sinus of also
although, sinotubular junction, and ascending aorta are 3.9 cm,
cm, and 3.6 cm.  There is no evidence of aortic dissection or
saccular aneurysm.  There is some irregular plaque within the
distal arch and proximal descending aorta.

Innominate artery, right subclavian artery, right common carotid
artery are patent.  Portions of the right subclavian artery are
suboptimally visualized secondary to contrast in the right
subclavian vein.  Left common carotid artery and left subclavian
artery are also widely patent.  There is no evidence of
pseudoaneurysm or dissection within the left common carotid or left
subclavian arteries.  There is mild chronic appearing plaque in the
proximal left subclavian artery.  There is also some calcified
plaque in the proximal left vertebral artery.  It is patent.  Mild
plaque at the origin of the right vertebral artery.  It is also
grossly patent.

There is soft tissue and fluid density as well as gas involving the
left pectoralis muscle and overlying subcutaneous tissue consistent
with recent pacemaker removal.  There is some thickening of the
musculature either due to postoperative change or inflammatory
change.  Similar changes in the left neck at the percutaneous leads
entry site.

No abnormal mediastinal adenopathy.  No pericardial effusion.

No pneumomediastinum. A small left basilar anterior pneumothorax is
present which is less than 5%.

Minimal bilateral pleural effusions associated with bibasilar
dependent volume loss.  No mass or consolidation.

Healing right anterolateral rib fracture on image 105.  It has a
subacute appearance.  Thoracic spine is demineralized without
compression deformity.
IMPRESSION: No evidence of aortic dissection or aneurysm.  No intramural
hematoma.

Postoperative changes in the anterior chest and left neck without
evidence of pseudoaneurysm formation.

Less than 5% left anterior and basilar pneumothorax.

Healing subacute right rib fracture.

## 2015-01-15 ENCOUNTER — Telehealth: Payer: Self-pay | Admitting: *Deleted

## 2015-01-15 NOTE — Telephone Encounter (Signed)
-----   Message from Sanda Klein, MD sent at 01/12/2015  5:34 PM EDT ----- Reassuringly normal echo

## 2015-01-15 NOTE — Telephone Encounter (Signed)
Echo results call - voiced understanding.

## 2015-01-18 ENCOUNTER — Encounter: Payer: Self-pay | Admitting: Internal Medicine

## 2015-02-01 ENCOUNTER — Ambulatory Visit (INDEPENDENT_AMBULATORY_CARE_PROVIDER_SITE_OTHER): Payer: Medicare HMO | Admitting: Cardiovascular Disease

## 2015-02-01 ENCOUNTER — Encounter: Payer: Self-pay | Admitting: Cardiovascular Disease

## 2015-02-01 VITALS — BP 120/70 | Resp 16 | Ht 68.0 in | Wt 143.5 lb

## 2015-02-01 DIAGNOSIS — Z950 Presence of cardiac pacemaker: Secondary | ICD-10-CM | POA: Diagnosis not present

## 2015-02-01 DIAGNOSIS — I442 Atrioventricular block, complete: Secondary | ICD-10-CM

## 2015-02-01 DIAGNOSIS — I480 Paroxysmal atrial fibrillation: Secondary | ICD-10-CM

## 2015-02-01 LAB — MDC_IDC_ENUM_SESS_TYPE_INCLINIC
Battery Voltage: 2.98 V
Date Time Interrogation Session: 20160414110950
Implantable Pulse Generator Model: 2210
Implantable Pulse Generator Serial Number: 7474476
Lead Channel Pacing Threshold Amplitude: 0.5 V
Lead Channel Pacing Threshold Amplitude: 0.625 V
Lead Channel Pacing Threshold Pulse Width: 0.5 ms
Lead Channel Pacing Threshold Pulse Width: 0.5 ms
Lead Channel Setting Pacing Pulse Width: 0.5 ms
Lead Channel Setting Sensing Sensitivity: 6 mV
MDC IDC MSMT BATTERY REMAINING LONGEVITY: 110.4 mo
MDC IDC MSMT LEADCHNL RA IMPEDANCE VALUE: 412.5 Ohm
MDC IDC MSMT LEADCHNL RA SENSING INTR AMPL: 1.9 mV
MDC IDC MSMT LEADCHNL RV IMPEDANCE VALUE: 475 Ohm
MDC IDC SET LEADCHNL RA PACING AMPLITUDE: 1.5 V
MDC IDC SET LEADCHNL RV PACING AMPLITUDE: 0.875
MDC IDC STAT BRADY RA PERCENT PACED: 98 %
MDC IDC STAT BRADY RV PERCENT PACED: 99.99 %

## 2015-02-01 NOTE — Progress Notes (Signed)
Patient ID: LINNIE MCGLOCKLIN, male   DOB: 24-Sep-1930, 81 y.o.   MRN: [id number]     Cardiology Office Note   Date:  02/04/2015   ID:  RAKEEN GAILLARD, DOB 07-01-1930, MRN [id number]  PCP:  Charletta Cousin, MD  Cardiologist:   Sanda Klein, MD   Chief Complaint  Patient presents with  . Follow-up    echo  . Shortness of Breath    since other pacemaker was put in      History of Present Illness: SERAFINO BURCIAGA is a 81 y.o. male who presents for follow up of pacemaker for complete heart block and recent echocardiogram for reduced exercise tolerance/dyspnea. Normal lLV systolic function, mild diastolic abnormalities, normal PA pressure on echo.  Normal pacemaker check. Walked around the office with appropriate sensor driven increase in heart rate to 100 bpm. No changes made.  He has a history of complete heart block. He had a sterile pocket erosion with device extraction and contralateral reimplantation in May 2014. He has not had any problems in the surgical site but notes that he has a little more dyspnea since his last surgery and this is slowly worsening over the last year. Having said that, this 81 year old gentleman still performs pretty intense physical activity for a man his age.  He has hypertension, hyperlipidemia and diet-controlled type 2 diabetes mellitus, coronary calcifications on CT, but no clinical coronary disease and a normal nuclear stress test in 2012 (pacing-induced artifact only). He has preserved left ventricular systolic function and no significant valvular abnormalities.  Past Medical History  Diagnosis Date  . High cholesterol   . Hypertension   . HOH (hard of hearing)   . Complete heart block     a. s/p gen change 07/2011. b. Pacer pocket infx 02/2013 - s/p extraction, temp perm placement, then eventual implantation of new St. Jude pacemaker 03/04/13.  . Cancer     skin cancer removed from head  . Coronary artery calcification     Seen on CT 02/2013.  Marland Kitchen  Hyperglycemia   . Pacemaker- St Jude  03/06/2013    Extracted 5/14 Reimplant 5/14   . Paroxysmal atrial fibrillation 01/04/2015    Past Surgical History  Procedure Laterality Date  . Pacemaker insertion    . Inguinal hernia repair Bilateral   . Icd lead removal N/A 03/02/2013    Procedure: ICD LEAD REMOVAL;  Surgeon: Evans Lance, MD;  Location: [place];  Service: Cardiovascular;  Laterality: N/A;  . Pacemaker lead removal N/A 03/02/2013    Procedure: PACEMAKER LEAD REMOVAL;  Surgeon: Evans Lance, MD;  Location: [place];  Service: Cardiovascular;  Laterality: N/A;  . Generator removal N/A 03/02/2013    Procedure: GENERATOR REMOVAL;  Surgeon: Evans Lance, MD;  Location: [place];  Service: Cardiovascular;  Laterality: N/A;  . Pacemaker generator change N/A 09/19/2011    Procedure: PACEMAKER GENERATOR CHANGE;  Surgeon: Sanda Klein, MD;  Location: [place] CATH LAB;  Service: Cardiovascular;  Laterality: N/A;  . Permanent pacemaker insertion N/A 03/04/2013    Procedure: PERMANENT PACEMAKER INSERTION;  Surgeon: Evans Lance, MD;  Location: [facility] CATH LAB;  Service: Cardiovascular;  Laterality: N/A;     Current Outpatient Prescriptions  Medication Sig Dispense Refill  . atorvastatin (LIPITOR) 20 MG tablet Take 1 tablet (20 mg total) by mouth daily. 90 tablet 3  . metoprolol (LOPRESSOR) 50 MG tablet Take 1 tablet (50 mg total) by mouth 2 (two) times daily.    . Omega-3 Fatty  Acids (FISH OIL) 1200 MG CAPS Take 2,400 mg by mouth daily.    . rivaroxaban (XARELTO) 20 MG TABS tablet Take 1 tablet (20 mg total) by mouth daily with supper. 20 tablet 0   No current facility-administered medications for this visit.    Allergies:   Review of patient's allergies indicates no known allergies.    Social History:  The patient  reports that he has never smoked. He does not have any smokeless tobacco history on file. He reports that he does not drink alcohol or use illicit drugs.   Family History:  Reviewed,  noncontributory   ROS:  Please see the history of present illness.    Dyspnea with moderate activity. No bleeding problems. The patient specifically denies any chest pain at rest or with exertion, dyspnea at rest or with usual exertion, orthopnea, paroxysmal nocturnal dyspnea, syncope, palpitations, focal neurological deficits, intermittent claudication, lower extremity edema, unexplained weight gain, cough, hemoptysis or wheezing.  The patient also denies abdominal pain, nausea, vomiting, dysphagia, diarrhea, constipation, polyuria, polydipsia, dysuria, hematuria, frequency, urgency, abnormal bleeding or bruising, fever, chills, unexpected weight changes, mood swings, change in skin or hair texture, change in voice quality, auditory or visual problems, allergic reactions or rashes, new musculoskeletal complaints other than usual "aches and pains".  All other systems are reviewed and negative.    PHYSICAL EXAM: VS:  BP 120/70 mmHg  Resp 16  Ht 5\' 8"  (1.727 m)  Wt 143 lb 8 oz (65.091 kg)  BMI 21.82 kg/m2 , BMI Body mass index is 21.82 kg/(m^2). General: Alert, oriented x3, no distress Head: no evidence of trauma, PERRL, EOMI, no exophtalmos or lid lag, no myxedema, no xanthelasma; normal ears, nose and oropharynx Neck: normal jugular venous pulsations and no hepatojugular reflux; brisk carotid pulses without delay and no carotid bruits Chest: clear to auscultation, no signs of consolidation by percussion or palpation, normal fremitus, symmetrical and full respiratory excursions Both the old left subclavian scar and then the right subclavian pacer site appeared healthy Cardiovascular: normal position and quality of the apical impulse, regular rhythm, normal first and paradoxically split second heart sounds, no murmurs, rubs or gallops Abdomen: no tenderness or distention, no masses by palpation, no abnormal pulsatility or arterial bruits, normal bowel sounds, no hepatosplenomegaly Extremities:  no clubbing, cyanosis or edema; 2+ radial, ulnar and brachial pulses bilaterally; 2+ right femoral, posterior tibial and dorsalis pedis pulses; 2+ left femoral, posterior tibial and dorsalis pedis pulses; no subclavian or femoral bruits Neurological: grossly nonfocal Psych: euthymic mood, full affect   EKG:  EKG is not ordered today.   Recent Labs: No results found for requested labs within last 365 days.    Lipid Panel No results found for: CHOL, TRIG, HDL, CHOLHDL, VLDL, LDLCALC, LDLDIRECT    Wt Readings from Last 3 Encounters:  02/01/15 143 lb 8 oz (65.091 kg)  01/02/15 144 lb (65.318 kg)  01/03/14 142 lb 9.6 oz (64.683 kg)      ASSESSMENT AND PLAN:  1. Paroxysmal atrial fibrillation - unaware of the arrhythmia. CHADSVasc is at least 4. No bleeding on Xarelto.  2. Complete heart block - pacemaker dependent.  3. Dual chamber permanent pacemaker - normal device function.  4. HTN - well controlled  5. Hyperlipidemia and mild DM - good control  6. Exertional dyspnea - no cardiac cause is apparent by exam, echo or pacemaker check   Current medicines are reviewed at length with the patient today.  The patient does not have  concerns regarding medicines.  The following changes have been made:  no change  Labs/ tests ordered today include:  Orders Placed This Encounter  Procedures  . Implantable device check   Patient Instructions  Remote monitoring is used to monitor your pacemaker from home. This monitoring reduces the number of office visits required to check your device to one time per year. It allows [place] to keep an eye on the functioning of your device to ensure it is working properly. You are scheduled for a device check from home on 05/07/2015. You may send your transmission at any time that day. If you have a wireless device, the transmission will be sent automatically. After your physician reviews your transmission, you will receive a postcard with your next  transmission date.  Your physician recommends that you schedule a follow-up appointment in: 12 months with Dr.Shara Hartis.     Mikael Spray, MD  02/04/2015 12:24 PM    Sanda Klein, MD, [facility] [phone] office [phone] pager

## 2015-02-01 NOTE — Patient Instructions (Addendum)
Remote monitoring is used to monitor your pacemaker from home. This monitoring reduces the number of office visits required to check your device to one time per year. It allows [place] to keep an eye on the functioning of your device to ensure it is working properly. You are scheduled for a device check from home on 05/07/2015. You may send your transmission at any time that day. If you have a wireless device, the transmission will be sent automatically. After your physician reviews your transmission, you will receive a postcard with your next transmission date.  Your physician recommends that you schedule a follow-up appointment in: 12 months with Dr.Croitoru.

## 2015-02-04 ENCOUNTER — Encounter: Payer: Self-pay | Admitting: Cardiovascular Disease

## 2015-02-12 ENCOUNTER — Encounter: Payer: Self-pay | Admitting: Internal Medicine

## 2015-05-07 ENCOUNTER — Ambulatory Visit (INDEPENDENT_AMBULATORY_CARE_PROVIDER_SITE_OTHER): Payer: Medicare HMO | Admitting: *Deleted

## 2015-05-07 ENCOUNTER — Encounter: Payer: Self-pay | Admitting: Cardiovascular Disease

## 2015-05-07 DIAGNOSIS — I442 Atrioventricular block, complete: Secondary | ICD-10-CM

## 2015-05-07 NOTE — Progress Notes (Signed)
Remote pacemaker transmission.   

## 2015-05-09 LAB — CUP PACEART REMOTE DEVICE CHECK
Battery Remaining Longevity: 109 mo
Battery Remaining Percentage: 95.5 %
Battery Voltage: 2.96 V
Brady Statistic AP VP Percent: 98 %
Brady Statistic AP VS Percent: 1 %
Brady Statistic AS VP Percent: 2 %
Brady Statistic RV Percent Paced: 99 %
Date Time Interrogation Session: 20160718065424
Lead Channel Pacing Threshold Amplitude: 0.5 V
Lead Channel Pacing Threshold Amplitude: 0.625 V
Lead Channel Pacing Threshold Pulse Width: 0.5 ms
Lead Channel Pacing Threshold Pulse Width: 0.5 ms
Lead Channel Setting Pacing Amplitude: 0.875
MDC IDC MSMT LEADCHNL RA IMPEDANCE VALUE: 430 Ohm
MDC IDC MSMT LEADCHNL RA SENSING INTR AMPL: 2.1 mV
MDC IDC MSMT LEADCHNL RV IMPEDANCE VALUE: 410 Ohm
MDC IDC PG SERIAL: 7474476
MDC IDC SET LEADCHNL RA PACING AMPLITUDE: 1.5 V
MDC IDC SET LEADCHNL RV PACING PULSEWIDTH: 0.5 ms
MDC IDC SET LEADCHNL RV SENSING SENSITIVITY: 6 mV
MDC IDC STAT BRADY AS VS PERCENT: 1 %
MDC IDC STAT BRADY RA PERCENT PACED: 98 %

## 2015-05-30 ENCOUNTER — Encounter: Payer: Self-pay | Admitting: Cardiology

## 2015-07-19 ENCOUNTER — Telehealth: Payer: Self-pay | Admitting: Cardiovascular Disease
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
## 2015-07-20 NOTE — Telephone Encounter (Signed)
[email or web address]/[email or web address] 

## 2015-07-20 NOTE — Telephone Encounter (Signed)
Returned call - she could not understand the message that was left for her

## 2015-08-02 NOTE — Telephone Encounter (Signed)
[email or web address]/[email or web address] 

## 2015-08-03 ENCOUNTER — Telehealth: Payer: Self-pay | Admitting: Cardiovascular Disease

## 2015-08-03 NOTE — Telephone Encounter (Signed)
New message ° ° ° ° ° °Returning a call to Robert Cuevas °
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
## 2015-08-06 ENCOUNTER — Telehealth: Payer: Self-pay | Admitting: Cardiology

## 2015-08-06 ENCOUNTER — Ambulatory Visit (INDEPENDENT_AMBULATORY_CARE_PROVIDER_SITE_OTHER): Payer: Medicare HMO | Admitting: *Deleted

## 2015-08-06 DIAGNOSIS — I442 Atrioventricular block, complete: Secondary | ICD-10-CM
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
## 2015-08-06 NOTE — Telephone Encounter (Signed)
LMOVM reminding pt to send remote transmission.   

## 2015-08-06 NOTE — Telephone Encounter (Signed)
F/u  Pt calling to speak w/ Pamala Hurry, having issues sending remote signal

## 2015-08-09 NOTE — Progress Notes (Signed)
REMOTE CHECK

## 2015-08-09 NOTE — Progress Notes (Deleted)
LOOP RECORDER  

## 2015-08-10 ENCOUNTER — Other Ambulatory Visit: Payer: Self-pay

## 2015-08-10 MED ORDER — RIVAROXABAN 20 MG PO TABS: 20.0000 mg | ORAL_TABLET | Freq: Every day | ORAL | Status: DC

## 2015-08-13 ENCOUNTER — Other Ambulatory Visit: Payer: Self-pay

## 2015-08-13 ENCOUNTER — Encounter: Payer: Self-pay | Admitting: Cardiology

## 2015-08-13 LAB — CUP PACEART REMOTE DEVICE CHECK
Implantable Lead Implant Date: 19930618
Implantable Lead Location: 753860
Implantable Lead Serial Number: 23032978
Lead Channel Impedance Value: 410 Ohm
Lead Channel Impedance Value: 410 Ohm
Lead Channel Pacing Threshold Amplitude: 0.5 V
Lead Channel Pacing Threshold Amplitude: 0.625 V
Lead Channel Sensing Intrinsic Amplitude: 2.1 mV
Lead Channel Setting Sensing Sensitivity: 6 mV
MDC IDC LEAD IMPLANT DT: 19930618
MDC IDC LEAD LOCATION: 753859
MDC IDC MSMT LEADCHNL RA PACING THRESHOLD PULSEWIDTH: 0.5 ms
MDC IDC MSMT LEADCHNL RV PACING THRESHOLD PULSEWIDTH: 0.5 ms
MDC IDC SESS DTM: 20161024081522
MDC IDC SET LEADCHNL RA PACING AMPLITUDE: 1.5 V
MDC IDC SET LEADCHNL RV PACING AMPLITUDE: 0.875
MDC IDC SET LEADCHNL RV PACING PULSEWIDTH: 0.5 ms
MDC IDC STAT BRADY RA PERCENT PACED: 98 %
MDC IDC STAT BRADY RV PERCENT PACED: 99 % — AB
Pulse Gen Serial Number: 7474476

## 2015-08-13 MED ORDER — RIVAROXABAN 20 MG PO TABS: 20.0000 mg | ORAL_TABLET | Freq: Every day | ORAL | Status: DC

## 2015-09-20 ENCOUNTER — Telehealth: Payer: Self-pay | Admitting: Cardiovascular Disease

## 2015-09-20 NOTE — Telephone Encounter (Signed)
Pt is going to have a colonoscopy in 2 weeks. He is on Xarelto,how many days before procedure,should he stop it?

## 2015-09-20 NOTE — Telephone Encounter (Signed)
Pt had colonoscopy recently in [place] and is now being sent to [facility] for a repeat study. They have indicated he should hold Xarelto but to contact us for recommendation of hold time.  Informed caller I would get advice for hold time, any other instructions, and return call.

## 2015-09-20 NOTE — Telephone Encounter (Signed)
Spoke with wife.  She states he was off Xarelto for "several days" before procedure, but also had skin tag removals around same time.  Advised wife that he should hold Xarelto for 2 days prior to next colonoscopy and restart day after, unless told otherwise by GI MD.  Wife voiced understanding.

## 2015-10-01 ENCOUNTER — Encounter: Payer: Self-pay | Admitting: Cardiovascular Disease

## 2015-10-01 ENCOUNTER — Telehealth: Payer: Self-pay | Admitting: *Deleted

## 2015-10-01 NOTE — Telephone Encounter (Signed)
Dr. Stephanie Acre not in [place]. Letter ready

## 2015-10-01 NOTE — Telephone Encounter (Signed)
Requesting surgical clearance:   1. Type of surgery: Colonoscopy with complex polypectomy   2. Surgeon: Dr. Ivor Messier  3. Surgical date: 10/29/2015  4. Medications that need to be held: Xarelto 2 weeks prior

## 2015-10-23 ENCOUNTER — Telehealth: Payer: Self-pay | Admitting: Cardiovascular Disease

## 2015-10-23 NOTE — Telephone Encounter (Signed)
She said they were still waiting for his clearance for his Colonoscopy. His Colonoscopy is Monday.

## 2015-10-26 NOTE — Telephone Encounter (Signed)
Dr. Marquis Buggy office contacted, they do have a clearance letter plus refaxed today.  Colonoscopy is not Monday but 11/20/15.  Message left for patient about letter and date of procedure.

## 2015-11-05 ENCOUNTER — Ambulatory Visit (INDEPENDENT_AMBULATORY_CARE_PROVIDER_SITE_OTHER): Payer: PPO | Admitting: *Deleted

## 2015-11-05 DIAGNOSIS — I442 Atrioventricular block, complete: Secondary | ICD-10-CM

## 2015-11-06 NOTE — Progress Notes (Signed)
Remote pacemaker transmission.   

## 2015-11-09 ENCOUNTER — Encounter: Payer: Self-pay | Admitting: Cardiology

## 2015-11-09 LAB — CUP PACEART REMOTE DEVICE CHECK
Battery Remaining Percentage: 95.5 %
Battery Voltage: 2.96 V
Brady Statistic AP VP Percent: 98 %
Brady Statistic AS VS Percent: 1 %
Implantable Lead Implant Date: 19930618
Lead Channel Impedance Value: 410 Ohm
Lead Channel Pacing Threshold Pulse Width: 0.5 ms
Lead Channel Sensing Intrinsic Amplitude: 2.1 mV
Lead Channel Setting Pacing Amplitude: 0.875
Lead Channel Setting Pacing Pulse Width: 0.5 ms
Lead Channel Setting Sensing Sensitivity: 6 mV
MDC IDC LEAD IMPLANT DT: 19930618
MDC IDC LEAD LOCATION: 753859
MDC IDC LEAD LOCATION: 753860
MDC IDC LEAD SERIAL: 23032978
MDC IDC MSMT BATTERY REMAINING LONGEVITY: 109 mo
MDC IDC MSMT LEADCHNL RA PACING THRESHOLD AMPLITUDE: 0.5 V
MDC IDC MSMT LEADCHNL RA PACING THRESHOLD PULSEWIDTH: 0.5 ms
MDC IDC MSMT LEADCHNL RV IMPEDANCE VALUE: 390 Ohm
MDC IDC MSMT LEADCHNL RV PACING THRESHOLD AMPLITUDE: 0.625 V
MDC IDC SESS DTM: 20170116070009
MDC IDC SET LEADCHNL RA PACING AMPLITUDE: 1.5 V
MDC IDC STAT BRADY AP VS PERCENT: 1 %
MDC IDC STAT BRADY AS VP PERCENT: 2 %
MDC IDC STAT BRADY RA PERCENT PACED: 98 %
MDC IDC STAT BRADY RV PERCENT PACED: 99 %
Pulse Gen Serial Number: 7474476

## 2015-11-20 DIAGNOSIS — I10 Essential (primary) hypertension: Secondary | ICD-10-CM | POA: Diagnosis not present

## 2015-11-20 DIAGNOSIS — K648 Other hemorrhoids: Secondary | ICD-10-CM | POA: Diagnosis not present

## 2015-11-20 DIAGNOSIS — I499 Cardiac arrhythmia, unspecified: Secondary | ICD-10-CM | POA: Diagnosis not present

## 2015-11-20 DIAGNOSIS — E785 Hyperlipidemia, unspecified: Secondary | ICD-10-CM | POA: Diagnosis not present

## 2015-11-20 DIAGNOSIS — K5730 Diverticulosis of large intestine without perforation or abscess without bleeding: Secondary | ICD-10-CM | POA: Diagnosis not present

## 2015-11-20 DIAGNOSIS — D122 Benign neoplasm of ascending colon: Secondary | ICD-10-CM | POA: Diagnosis not present

## 2015-11-20 DIAGNOSIS — N400 Enlarged prostate without lower urinary tract symptoms: Secondary | ICD-10-CM | POA: Diagnosis not present

## 2015-11-20 DIAGNOSIS — D126 Benign neoplasm of colon, unspecified: Secondary | ICD-10-CM | POA: Diagnosis not present

## 2015-11-20 DIAGNOSIS — E119 Type 2 diabetes mellitus without complications: Secondary | ICD-10-CM | POA: Diagnosis not present

## 2015-11-20 DIAGNOSIS — D120 Benign neoplasm of cecum: Secondary | ICD-10-CM | POA: Diagnosis not present

## 2015-11-20 DIAGNOSIS — Z950 Presence of cardiac pacemaker: Secondary | ICD-10-CM | POA: Diagnosis not present

## 2015-11-20 DIAGNOSIS — Z85828 Personal history of other malignant neoplasm of skin: Secondary | ICD-10-CM | POA: Diagnosis not present

## 2015-11-20 DIAGNOSIS — D123 Benign neoplasm of transverse colon: Secondary | ICD-10-CM | POA: Diagnosis not present

## 2015-11-20 DIAGNOSIS — K644 Residual hemorrhoidal skin tags: Secondary | ICD-10-CM | POA: Diagnosis not present

## 2016-02-21 ENCOUNTER — Ambulatory Visit (INDEPENDENT_AMBULATORY_CARE_PROVIDER_SITE_OTHER): Payer: PPO | Admitting: Cardiovascular Disease

## 2016-02-21 ENCOUNTER — Encounter: Payer: Self-pay | Admitting: Cardiovascular Disease

## 2016-02-21 VITALS — BP 141/74 | HR 70 | Ht 68.0 in | Wt 142.0 lb

## 2016-02-21 DIAGNOSIS — Z950 Presence of cardiac pacemaker: Secondary | ICD-10-CM

## 2016-02-21 DIAGNOSIS — E119 Type 2 diabetes mellitus without complications: Secondary | ICD-10-CM | POA: Diagnosis not present

## 2016-02-21 DIAGNOSIS — L84 Corns and callosities: Secondary | ICD-10-CM | POA: Diagnosis not present

## 2016-02-21 DIAGNOSIS — I442 Atrioventricular block, complete: Secondary | ICD-10-CM

## 2016-02-21 DIAGNOSIS — Z7901 Long term (current) use of anticoagulants: Secondary | ICD-10-CM

## 2016-02-21 DIAGNOSIS — I480 Paroxysmal atrial fibrillation: Secondary | ICD-10-CM | POA: Diagnosis not present

## 2016-02-21 NOTE — Progress Notes (Signed)
Patient ID: Robert Cuevas, male   DOB: [birth year], 82 y.o.   MRN: [id number]    Cardiology Office Note    Date:  02/23/2016   ID:  Robert Cuevas, DOB 02/24/1930, MRN [id number]  PCP:  Charletta Cousin, MD  Cardiologist:   Sanda Klein, MD   Chief Complaint  Patient presents with  . Follow-up    pacer check    History of Present Illness:  Robert Cuevas is a 82 y.o. male was complete heart block, infrequent paroxysmal atrial fibrillation, dual-chamber permanent pacemaker(current generator implanted in 2014 in the right subclavian area after he had a sterile left subclavian pocket erosion.  He remains very active. He has a an extensive garden growing vegetables and corn and he raises beef cows. He denies problems with dyspnea or angina, palpitations, syncope, leg edema, claudication, focal neurological complaints or any bleeding problems. He is compliant with once daily Xarelto. He has reasonably good diabetes control (A1c 7%) and a fairly good lipid profile on statin therapy(chronically low HDL 35, but LDL 58). He has known coronary artery calcifications by CT but had a normal nuclear stress test in 2012 (pacing induced septal artifact). He has normal left ventricular systolic function and does not have meaningful valvular abnormalities.  Pacemaker interrogation today shows normal device function. He has a Pharmacologist. He is pacemaker dependent without any ventricular escape rhythm. Current generator longevity is roughly 8.8-9.5 years, 98% atrial pacing, 99% ventricular pacing, favorable heart rate histogram distribution. Episodes of months which are very rare (only 13 episodes lasting up to 13 minutes maximum, for overall burden of less than 1%). The longer episodes are clearly atrial fibrillation.  His primary care provider is Dr. Sydnee Cabal, M.D. at [facility], [address] [place], [place] [address]  Past Medical History  Diagnosis Date  . High  cholesterol   . Hypertension   . HOH (hard of hearing)   . Complete heart block ([place])     a. s/p gen change 07/2011. b. Pacer pocket infx 02/2013 - s/p extraction, temp perm placement, then eventual implantation of new St. Jude pacemaker 03/04/13.  . Cancer ([place])     skin cancer removed from head  . Coronary artery calcification     Seen on CT 02/2013.  Marland Kitchen Hyperglycemia   . Pacemaker- St Jude  03/06/2013    Extracted 5/14 Reimplant 5/14   . Paroxysmal atrial fibrillation ([place]) 01/04/2015    Past Surgical History  Procedure Laterality Date  . Pacemaker insertion    . Inguinal hernia repair Bilateral   . Icd lead removal N/A 03/02/2013    Procedure: ICD LEAD REMOVAL;  Surgeon: Evans Lance, MD;  Location: [facility];  Service: Cardiovascular;  Laterality: N/A;  . Pacemaker lead removal N/A 03/02/2013    Procedure: PACEMAKER LEAD REMOVAL;  Surgeon: Evans Lance, MD;  Location: [facility];  Service: Cardiovascular;  Laterality: N/A;  . Generator removal N/A 03/02/2013    Procedure: GENERATOR REMOVAL;  Surgeon: Evans Lance, MD;  Location: [facility];  Service: Cardiovascular;  Laterality: N/A;  . Pacemaker generator change N/A 09/19/2011    Procedure: PACEMAKER GENERATOR CHANGE;  Surgeon: Sanda Klein, MD;  Location: [facility] CATH LAB;  Service: Cardiovascular;  Laterality: N/A;  . Permanent pacemaker insertion N/A 03/04/2013    Procedure: PERMANENT PACEMAKER INSERTION;  Surgeon: Evans Lance, MD;  Location: [facility] CATH LAB;  Service: Cardiovascular;  Laterality: N/A;    Current Medications: Outpatient Prescriptions  Prior to Visit  Medication Sig Dispense Refill  . atorvastatin (LIPITOR) 20 MG tablet Take 1 tablet (20 mg total) by mouth daily. 90 tablet 3  . metoprolol (LOPRESSOR) 50 MG tablet Take 1 tablet (50 mg total) by mouth 2 (two) times daily.    . Omega-3 Fatty Acids (FISH OIL) 1200 MG CAPS Take 2,400 mg by mouth daily.    . rivaroxaban (XARELTO) 20 MG TABS tablet Take 1 tablet (20 mg total) by mouth  daily with supper. 30 tablet 11   No facility-administered medications prior to visit.     Allergies:   Review of patient's allergies indicates no known allergies.   Social History   Social History  . Marital Status: Married    Spouse Name: N/A  . Number of Children: N/A  . Years of Education: N/A   Social History Main Topics  . Smoking status: Never Smoker   . Smokeless tobacco: None  . Alcohol Use: No  . Drug Use: No  . Sexual Activity: Not Asked   Other Topics Concern  . None   Social History Narrative      ROS:   Please see the history of present illness.    ROS All other systems reviewed and are negative.   PHYSICAL EXAM:   VS:  BP 141/74 mmHg  Pulse 70  Ht 5\' 8"  (1.727 m)  Wt 64.411 kg (142 lb)  BMI 21.60 kg/m2  SpO2 94%   GEN: Well nourished, well developed, in no acute distress HEENT: normal Neck: no JVD, carotid bruits, or masses Cardiac: Paradoxically split second heart sound RRR; no murmurs, rubs, or gallops,no edema , healthy subclavian pacemaker pocket on the right side and well-healed scar in the left Respiratory:  clear to auscultation bilaterally, normal work of breathing GI: soft, nontender, nondistended, + BS MS: no deformity or atrophy Skin: warm and dry, no rash Neuro:  Alert and Oriented x 3, Strength and sensation are intact Psych: euthymic mood, full affect  Wt Readings from Last 3 Encounters:  02/21/16 64.411 kg (142 lb)  02/01/15 65.091 kg (143 lb 8 oz)  01/02/15 65.318 kg (144 lb)      Studies/Labs Reviewed:   EKG:  EKG is ordered today.  The ekg ordered today demonstrates A the sequential pacing, QTC 507 ms  Recent Labs: Hemoglobin 15.6, creatinine 0.7, total cholesterol 112, triglycerides 97, HDL 35, LDL 58, hemoglobin A1c 7%  ASSESSMENT:    1. Complete heart block (HCC)   2. Paroxysmal atrial fibrillation ([place])   3. Pacemaker- St Jude    4. Long term (current) use of anticoagulants      PLAN:  In order of  problems listed above:  1. CHB: Pacemaker dependent.  2. Afib: He has infrequent episodes of atrial fibrillation that are asymptomatic. He has elevated embolic risk and is on appropriate Xarelto anticoagulation. CHADSVasc 4 (age 21, DM, HTN).  3. PPM: Normal device function. Continue remote downloads every 3 months and office visit at least yearly. Current device was reimplanted on the contralateral side after sterile pocket erosion 3 years ago. 4. Anticoagulation: Well tolerated, without bleeding complications.    Medication Adjustments/Labs and Tests Ordered: Current medicines are reviewed at length with the patient today.  Concerns regarding medicines are outlined above.  Medication changes, Labs and Tests ordered today are listed in the Patient Instructions below. Patient Instructions  Dr Sallyanne Kuster recommends that you continue on your current medications as directed. Please refer to the Current Medication list given  to you today.  Remote monitoring is used to monitor your Pacemaker of ICD from home. This monitoring reduces the number of office visits required to check your device to one time per year. It allows [place] to keep an eye on the functioning of your device to ensure it is working properly. You are scheduled for a device check from home on Monday, August 7th, 2017. You may send your transmission at any time that day. If you have a wireless device, the transmission will be sent automatically. After your physician reviews your transmission, you will receive a postcard with your next transmission date.  Dr Sallyanne Kuster recommends that you schedule a follow-up appointment in 1 year with a pacemaker check. You will receive a reminder letter in the mail two months in advance. If you don't receive a letter, please call our office to schedule the follow-up appointment.  If you need a refill on your cardiac medications before your next appointment, please call your pharmacy.     Mikael Spray, MD  02/23/2016 9:00 PM    [address], [place], [place]  [address] Phone: [phone]; Fax: [phone]

## 2016-02-21 NOTE — Patient Instructions (Signed)
Dr Sallyanne Kuster recommends that you continue on your current medications as directed. Please refer to the Current Medication list given to you today.  Remote monitoring is used to monitor your Pacemaker of ICD from home. This monitoring reduces the number of office visits required to check your device to one time per year. It allows [place] to keep an eye on the functioning of your device to ensure it is working properly. You are scheduled for a device check from home on Monday, August 7th, 2017. You may send your transmission at any time that day. If you have a wireless device, the transmission will be sent automatically. After your physician reviews your transmission, you will receive a postcard with your next transmission date.  Dr Sallyanne Kuster recommends that you schedule a follow-up appointment in 1 year with a pacemaker check. You will receive a reminder letter in the mail two months in advance. If you don't receive a letter, please call our office to schedule the follow-up appointment.  If you need a refill on your cardiac medications before your next appointment, please call your pharmacy.

## 2016-02-23 DIAGNOSIS — Z7901 Long term (current) use of anticoagulants: Secondary | ICD-10-CM | POA: Insufficient documentation

## 2016-02-25 ENCOUNTER — Other Ambulatory Visit: Payer: Self-pay | Admitting: *Deleted

## 2016-02-25 MED ORDER — ATORVASTATIN CALCIUM 20 MG PO TABS: 20.0000 mg | ORAL_TABLET | Freq: Every day | ORAL | Status: DC

## 2016-02-25 NOTE — Telephone Encounter (Signed)
Rx request sent to pharmacy.  

## 2016-02-26 ENCOUNTER — Encounter: Payer: Self-pay | Admitting: Cardiovascular Disease

## 2016-03-04 ENCOUNTER — Telehealth: Payer: Self-pay | Admitting: *Deleted

## 2016-03-04 NOTE — Telephone Encounter (Signed)
Xarelto 20 mg #2 Lot # [id number] EXP 8-19 Left message samples at front desk

## 2016-03-08 LAB — CUP PACEART INCLINIC DEVICE CHECK
Date Time Interrogation Session: 20170520092424
Implantable Lead Location: 753860
MDC IDC LEAD IMPLANT DT: 19930618
MDC IDC LEAD IMPLANT DT: 19930618
MDC IDC LEAD LOCATION: 753859
MDC IDC LEAD SERIAL: 23032978
Pulse Gen Serial Number: 7474476

## 2016-03-12 ENCOUNTER — Encounter: Payer: Self-pay | Admitting: Internal Medicine

## 2016-04-10 DIAGNOSIS — L570 Actinic keratosis: Secondary | ICD-10-CM | POA: Diagnosis not present

## 2016-05-21 ENCOUNTER — Telehealth: Payer: Self-pay | Admitting: Cardiovascular Disease

## 2016-05-21 NOTE — Telephone Encounter (Signed)
New message  Pt wife call requesting to speak with RN about picking up samples of Xarelto for pt. Please call back to discuss

## 2016-05-21 NOTE — Telephone Encounter (Signed)
Patient aware samples are at the front desk for pick up  

## 2016-05-26 ENCOUNTER — Ambulatory Visit (INDEPENDENT_AMBULATORY_CARE_PROVIDER_SITE_OTHER): Payer: PPO | Admitting: *Deleted

## 2016-05-26 DIAGNOSIS — I442 Atrioventricular block, complete: Secondary | ICD-10-CM | POA: Diagnosis not present

## 2016-05-26 NOTE — Progress Notes (Signed)
Remote pacemaker transmission.   

## 2016-05-28 ENCOUNTER — Encounter: Payer: Self-pay | Admitting: Cardiology

## 2016-05-30 LAB — CUP PACEART REMOTE DEVICE CHECK
Battery Voltage: 2.96 V
Brady Statistic AP VS Percent: 1 %
Brady Statistic AS VP Percent: 1.7 %
Brady Statistic RA Percent Paced: 98 %
Brady Statistic RV Percent Paced: 99 %
Implantable Lead Implant Date: 19930618
Implantable Lead Location: 753859
Implantable Lead Location: 753860
Implantable Lead Serial Number: 23032978
Lead Channel Impedance Value: 400 Ohm
Lead Channel Pacing Threshold Amplitude: 0.5 V
Lead Channel Pacing Threshold Amplitude: 0.75 V
Lead Channel Pacing Threshold Pulse Width: 0.5 ms
Lead Channel Sensing Intrinsic Amplitude: 2 mV
Lead Channel Setting Pacing Amplitude: 1.5 V
Lead Channel Setting Sensing Sensitivity: 6 mV
MDC IDC LEAD IMPLANT DT: 19930618
MDC IDC MSMT BATTERY REMAINING LONGEVITY: 109 mo
MDC IDC MSMT BATTERY REMAINING PERCENTAGE: 95.5 %
MDC IDC MSMT LEADCHNL RA IMPEDANCE VALUE: 430 Ohm
MDC IDC MSMT LEADCHNL RV PACING THRESHOLD PULSEWIDTH: 0.5 ms
MDC IDC PG SERIAL: 7474476
MDC IDC SESS DTM: 20170807071443
MDC IDC SET LEADCHNL RV PACING AMPLITUDE: 1 V
MDC IDC SET LEADCHNL RV PACING PULSEWIDTH: 0.5 ms
MDC IDC STAT BRADY AP VP PERCENT: 98 %
MDC IDC STAT BRADY AS VS PERCENT: 1 %

## 2016-06-04 DIAGNOSIS — E785 Hyperlipidemia, unspecified: Secondary | ICD-10-CM | POA: Diagnosis not present

## 2016-06-04 DIAGNOSIS — E119 Type 2 diabetes mellitus without complications: Secondary | ICD-10-CM | POA: Diagnosis not present

## 2016-06-04 DIAGNOSIS — Z87898 Personal history of other specified conditions: Secondary | ICD-10-CM | POA: Diagnosis not present

## 2016-06-04 DIAGNOSIS — E559 Vitamin D deficiency, unspecified: Secondary | ICD-10-CM | POA: Diagnosis not present

## 2016-06-04 DIAGNOSIS — I10 Essential (primary) hypertension: Secondary | ICD-10-CM | POA: Diagnosis not present

## 2016-07-28 DIAGNOSIS — S8011XA Contusion of right lower leg, initial encounter: Secondary | ICD-10-CM | POA: Diagnosis not present

## 2016-07-28 DIAGNOSIS — Z23 Encounter for immunization: Secondary | ICD-10-CM | POA: Diagnosis not present

## 2016-08-11 DIAGNOSIS — H43813 Vitreous degeneration, bilateral: Secondary | ICD-10-CM | POA: Diagnosis not present

## 2016-08-11 DIAGNOSIS — H524 Presbyopia: Secondary | ICD-10-CM | POA: Diagnosis not present

## 2016-08-11 DIAGNOSIS — I10 Essential (primary) hypertension: Secondary | ICD-10-CM | POA: Diagnosis not present

## 2016-08-11 DIAGNOSIS — H25813 Combined forms of age-related cataract, bilateral: Secondary | ICD-10-CM | POA: Diagnosis not present

## 2016-08-11 DIAGNOSIS — H43393 Other vitreous opacities, bilateral: Secondary | ICD-10-CM | POA: Diagnosis not present

## 2016-08-11 DIAGNOSIS — H52223 Regular astigmatism, bilateral: Secondary | ICD-10-CM | POA: Diagnosis not present

## 2016-08-11 DIAGNOSIS — H5203 Hypermetropia, bilateral: Secondary | ICD-10-CM | POA: Diagnosis not present

## 2016-08-25 ENCOUNTER — Ambulatory Visit (INDEPENDENT_AMBULATORY_CARE_PROVIDER_SITE_OTHER): Payer: PPO | Admitting: *Deleted

## 2016-08-25 DIAGNOSIS — I442 Atrioventricular block, complete: Secondary | ICD-10-CM | POA: Diagnosis not present

## 2016-08-26 DIAGNOSIS — H2511 Age-related nuclear cataract, right eye: Secondary | ICD-10-CM | POA: Diagnosis not present

## 2016-08-26 DIAGNOSIS — H40033 Anatomical narrow angle, bilateral: Secondary | ICD-10-CM | POA: Diagnosis not present

## 2016-08-26 NOTE — Progress Notes (Signed)
Remote pacemaker transmission.   

## 2016-08-27 ENCOUNTER — Other Ambulatory Visit: Payer: Self-pay | Admitting: Cardiovascular Disease

## 2016-08-27 ENCOUNTER — Encounter: Payer: Self-pay | Admitting: Cardiology

## 2016-09-08 DIAGNOSIS — E559 Vitamin D deficiency, unspecified: Secondary | ICD-10-CM | POA: Diagnosis not present

## 2016-09-08 DIAGNOSIS — E1169 Type 2 diabetes mellitus with other specified complication: Secondary | ICD-10-CM | POA: Diagnosis not present

## 2016-09-08 DIAGNOSIS — Z1212 Encounter for screening for malignant neoplasm of rectum: Secondary | ICD-10-CM | POA: Diagnosis not present

## 2016-09-08 DIAGNOSIS — Z125 Encounter for screening for malignant neoplasm of prostate: Secondary | ICD-10-CM | POA: Diagnosis not present

## 2016-09-08 DIAGNOSIS — Z1211 Encounter for screening for malignant neoplasm of colon: Secondary | ICD-10-CM | POA: Diagnosis not present

## 2016-09-08 DIAGNOSIS — Z0000 Encounter for general adult medical examination without abnormal findings: Secondary | ICD-10-CM | POA: Diagnosis not present

## 2016-09-08 DIAGNOSIS — I10 Essential (primary) hypertension: Secondary | ICD-10-CM | POA: Diagnosis not present

## 2016-09-08 DIAGNOSIS — Z79899 Other long term (current) drug therapy: Secondary | ICD-10-CM | POA: Diagnosis not present

## 2016-09-08 DIAGNOSIS — H409 Unspecified glaucoma: Secondary | ICD-10-CM | POA: Diagnosis not present

## 2016-09-08 DIAGNOSIS — E785 Hyperlipidemia, unspecified: Secondary | ICD-10-CM | POA: Diagnosis not present

## 2016-09-08 DIAGNOSIS — Z1389 Encounter for screening for other disorder: Secondary | ICD-10-CM | POA: Diagnosis not present

## 2016-09-08 DIAGNOSIS — H269 Unspecified cataract: Secondary | ICD-10-CM | POA: Diagnosis not present

## 2016-09-10 LAB — CUP PACEART REMOTE DEVICE CHECK
Battery Remaining Percentage: 95.5 %
Battery Voltage: 2.96 V
Brady Statistic AP VP Percent: 98 %
Brady Statistic AS VP Percent: 1.6 %
Brady Statistic AS VS Percent: 1 %
Brady Statistic RA Percent Paced: 98 %
Brady Statistic RV Percent Paced: 99 %
Implantable Lead Implant Date: 19930618
Implantable Lead Location: 753860
Implantable Lead Serial Number: 23032978
Implantable Pulse Generator Implant Date: 20140516
Lead Channel Impedance Value: 400 Ohm
Lead Channel Pacing Threshold Pulse Width: 0.5 ms
Lead Channel Pacing Threshold Pulse Width: 0.5 ms
Lead Channel Sensing Intrinsic Amplitude: 1.8 mV
Lead Channel Setting Pacing Pulse Width: 0.5 ms
Lead Channel Setting Sensing Sensitivity: 6 mV
MDC IDC LEAD IMPLANT DT: 19930618
MDC IDC LEAD LOCATION: 753859
MDC IDC MSMT BATTERY REMAINING LONGEVITY: 109 mo
MDC IDC MSMT LEADCHNL RA IMPEDANCE VALUE: 430 Ohm
MDC IDC MSMT LEADCHNL RA PACING THRESHOLD AMPLITUDE: 0.5 V
MDC IDC MSMT LEADCHNL RV PACING THRESHOLD AMPLITUDE: 0.75 V
MDC IDC PG SERIAL: 7474476
MDC IDC SESS DTM: 20171106074608
MDC IDC SET LEADCHNL RA PACING AMPLITUDE: 1.5 V
MDC IDC SET LEADCHNL RV PACING AMPLITUDE: 1 V
MDC IDC STAT BRADY AP VS PERCENT: 1 %

## 2016-09-16 DIAGNOSIS — Z7901 Long term (current) use of anticoagulants: Secondary | ICD-10-CM | POA: Diagnosis not present

## 2016-09-16 DIAGNOSIS — Z950 Presence of cardiac pacemaker: Secondary | ICD-10-CM | POA: Diagnosis not present

## 2016-09-16 DIAGNOSIS — Z7984 Long term (current) use of oral hypoglycemic drugs: Secondary | ICD-10-CM | POA: Diagnosis not present

## 2016-09-16 DIAGNOSIS — E119 Type 2 diabetes mellitus without complications: Secondary | ICD-10-CM | POA: Diagnosis not present

## 2016-09-16 DIAGNOSIS — E785 Hyperlipidemia, unspecified: Secondary | ICD-10-CM | POA: Diagnosis not present

## 2016-09-16 DIAGNOSIS — I10 Essential (primary) hypertension: Secondary | ICD-10-CM | POA: Diagnosis not present

## 2016-09-16 DIAGNOSIS — Z79899 Other long term (current) drug therapy: Secondary | ICD-10-CM | POA: Diagnosis not present

## 2016-09-16 DIAGNOSIS — H2511 Age-related nuclear cataract, right eye: Secondary | ICD-10-CM | POA: Diagnosis not present

## 2016-09-16 DIAGNOSIS — Z961 Presence of intraocular lens: Secondary | ICD-10-CM | POA: Diagnosis not present

## 2016-10-16 DIAGNOSIS — L570 Actinic keratosis: Secondary | ICD-10-CM | POA: Diagnosis not present

## 2016-11-07 DIAGNOSIS — E119 Type 2 diabetes mellitus without complications: Secondary | ICD-10-CM | POA: Diagnosis not present

## 2016-11-11 DIAGNOSIS — Z950 Presence of cardiac pacemaker: Secondary | ICD-10-CM | POA: Diagnosis not present

## 2016-11-11 DIAGNOSIS — I10 Essential (primary) hypertension: Secondary | ICD-10-CM | POA: Diagnosis not present

## 2016-11-11 DIAGNOSIS — H2512 Age-related nuclear cataract, left eye: Secondary | ICD-10-CM | POA: Diagnosis not present

## 2016-11-11 DIAGNOSIS — Z79899 Other long term (current) drug therapy: Secondary | ICD-10-CM | POA: Diagnosis not present

## 2016-11-11 DIAGNOSIS — Z9842 Cataract extraction status, left eye: Secondary | ICD-10-CM | POA: Diagnosis not present

## 2016-11-11 DIAGNOSIS — Z7984 Long term (current) use of oral hypoglycemic drugs: Secondary | ICD-10-CM | POA: Diagnosis not present

## 2016-11-11 DIAGNOSIS — Z7901 Long term (current) use of anticoagulants: Secondary | ICD-10-CM | POA: Diagnosis not present

## 2016-11-11 DIAGNOSIS — Z961 Presence of intraocular lens: Secondary | ICD-10-CM | POA: Diagnosis not present

## 2016-11-11 DIAGNOSIS — E119 Type 2 diabetes mellitus without complications: Secondary | ICD-10-CM | POA: Diagnosis not present

## 2016-11-11 DIAGNOSIS — I5189 Other ill-defined heart diseases: Secondary | ICD-10-CM | POA: Diagnosis not present

## 2016-11-24 ENCOUNTER — Ambulatory Visit (INDEPENDENT_AMBULATORY_CARE_PROVIDER_SITE_OTHER): Payer: PPO | Admitting: *Deleted

## 2016-11-24 DIAGNOSIS — I442 Atrioventricular block, complete: Secondary | ICD-10-CM | POA: Diagnosis not present

## 2016-11-25 LAB — CUP PACEART REMOTE DEVICE CHECK
Battery Remaining Percentage: 91 %
Brady Statistic AP VP Percent: 98 %
Brady Statistic AP VS Percent: 1 %
Brady Statistic AS VP Percent: 1.6 %
Brady Statistic RV Percent Paced: 99 %
Date Time Interrogation Session: 20180205070009
Implantable Lead Implant Date: 19930618
Implantable Lead Location: 753860
Implantable Lead Serial Number: 23032978
Lead Channel Impedance Value: 410 Ohm
Lead Channel Impedance Value: 430 Ohm
Lead Channel Pacing Threshold Amplitude: 0.75 V
Lead Channel Pacing Threshold Pulse Width: 0.5 ms
Lead Channel Setting Pacing Amplitude: 1.625
Lead Channel Setting Sensing Sensitivity: 6 mV
MDC IDC LEAD IMPLANT DT: 19930618
MDC IDC LEAD LOCATION: 753859
MDC IDC MSMT BATTERY REMAINING LONGEVITY: 99 mo
MDC IDC MSMT BATTERY VOLTAGE: 2.95 V
MDC IDC MSMT LEADCHNL RA PACING THRESHOLD AMPLITUDE: 0.625 V
MDC IDC MSMT LEADCHNL RA SENSING INTR AMPL: 1.9 mV
MDC IDC MSMT LEADCHNL RV PACING THRESHOLD PULSEWIDTH: 0.5 ms
MDC IDC PG IMPLANT DT: 20140516
MDC IDC PG SERIAL: 7474476
MDC IDC SET LEADCHNL RV PACING AMPLITUDE: 1 V
MDC IDC SET LEADCHNL RV PACING PULSEWIDTH: 0.5 ms
MDC IDC STAT BRADY AS VS PERCENT: 1 %
MDC IDC STAT BRADY RA PERCENT PACED: 98 %
Pulse Gen Model: 2210

## 2016-11-25 NOTE — Progress Notes (Signed)
Remote pacemaker transmission.   

## 2016-11-26 ENCOUNTER — Encounter: Payer: Self-pay | Admitting: Cardiology

## 2016-12-04 ENCOUNTER — Telehealth: Payer: Self-pay | Admitting: Cardiovascular Disease

## 2016-12-04 NOTE — Telephone Encounter (Signed)
Spoke with the [place], they are requesting a change to eliquis because the patient is 83 years old and there is less bleeding risk. Will forward to dr croitoru to review and advise.

## 2016-12-04 NOTE — Telephone Encounter (Signed)
New Message    Thank you for faxing paperwork on pt.   [facility] would like to speak with Dr Sallyanne Kuster about taking pt off of Xarelto and putting him on another medication Eliquis

## 2016-12-04 NOTE — Telephone Encounter (Signed)
OK. Eliquis 5 mg BID. If you have a way to contact them, can you convince them to send over his most recent labs, please? MCr

## 2016-12-05 NOTE — Telephone Encounter (Addendum)
[facility], [facility] will make the change and [facility] it out to the patient. Left message for pt to call to make him aware of the change.

## 2016-12-05 NOTE — Telephone Encounter (Signed)
Follow Up    Please call Pt wife is calling back to find out why Hilda Blades called

## 2016-12-05 NOTE — Telephone Encounter (Signed)
Spoke with pt wife, aware of the change from xarelto to eliquis. Follow up scheduled per recall.

## 2016-12-16 DIAGNOSIS — E785 Hyperlipidemia, unspecified: Secondary | ICD-10-CM | POA: Diagnosis not present

## 2016-12-16 DIAGNOSIS — E559 Vitamin D deficiency, unspecified: Secondary | ICD-10-CM | POA: Diagnosis not present

## 2016-12-16 DIAGNOSIS — E1169 Type 2 diabetes mellitus with other specified complication: Secondary | ICD-10-CM | POA: Diagnosis not present

## 2016-12-16 DIAGNOSIS — Z87898 Personal history of other specified conditions: Secondary | ICD-10-CM | POA: Diagnosis not present

## 2016-12-16 DIAGNOSIS — N401 Enlarged prostate with lower urinary tract symptoms: Secondary | ICD-10-CM | POA: Diagnosis not present

## 2016-12-16 DIAGNOSIS — I10 Essential (primary) hypertension: Secondary | ICD-10-CM | POA: Diagnosis not present

## 2016-12-16 DIAGNOSIS — Z79899 Other long term (current) drug therapy: Secondary | ICD-10-CM | POA: Diagnosis not present

## 2017-02-17 DIAGNOSIS — E1169 Type 2 diabetes mellitus with other specified complication: Secondary | ICD-10-CM | POA: Diagnosis not present

## 2017-02-17 DIAGNOSIS — I10 Essential (primary) hypertension: Secondary | ICD-10-CM | POA: Diagnosis not present

## 2017-02-17 DIAGNOSIS — Z0000 Encounter for general adult medical examination without abnormal findings: Secondary | ICD-10-CM | POA: Diagnosis not present

## 2017-02-17 DIAGNOSIS — E559 Vitamin D deficiency, unspecified: Secondary | ICD-10-CM | POA: Diagnosis not present

## 2017-02-17 DIAGNOSIS — Z1389 Encounter for screening for other disorder: Secondary | ICD-10-CM | POA: Diagnosis not present

## 2017-02-17 DIAGNOSIS — Z125 Encounter for screening for malignant neoplasm of prostate: Secondary | ICD-10-CM | POA: Diagnosis not present

## 2017-02-17 DIAGNOSIS — E785 Hyperlipidemia, unspecified: Secondary | ICD-10-CM | POA: Diagnosis not present

## 2017-02-27 ENCOUNTER — Encounter: Payer: Self-pay | Admitting: Cardiovascular Disease

## 2017-02-27 ENCOUNTER — Ambulatory Visit (INDEPENDENT_AMBULATORY_CARE_PROVIDER_SITE_OTHER): Payer: PPO | Admitting: Cardiovascular Disease

## 2017-02-27 VITALS — BP 135/70 | HR 70 | Ht 68.0 in | Wt 146.0 lb

## 2017-02-27 DIAGNOSIS — Z950 Presence of cardiac pacemaker: Secondary | ICD-10-CM

## 2017-02-27 DIAGNOSIS — Z7901 Long term (current) use of anticoagulants: Secondary | ICD-10-CM | POA: Diagnosis not present

## 2017-02-27 DIAGNOSIS — I480 Paroxysmal atrial fibrillation: Secondary | ICD-10-CM | POA: Diagnosis not present

## 2017-02-27 DIAGNOSIS — I442 Atrioventricular block, complete: Secondary | ICD-10-CM | POA: Diagnosis not present

## 2017-02-27 NOTE — Patient Instructions (Signed)
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
## 2017-04-08 DIAGNOSIS — H52223 Regular astigmatism, bilateral: Secondary | ICD-10-CM | POA: Diagnosis not present

## 2017-04-08 DIAGNOSIS — H524 Presbyopia: Secondary | ICD-10-CM | POA: Diagnosis not present

## 2017-04-08 DIAGNOSIS — H26492 Other secondary cataract, left eye: Secondary | ICD-10-CM | POA: Diagnosis not present

## 2017-04-08 DIAGNOSIS — H5203 Hypermetropia, bilateral: Secondary | ICD-10-CM | POA: Diagnosis not present

## 2017-04-08 DIAGNOSIS — Z961 Presence of intraocular lens: Secondary | ICD-10-CM | POA: Diagnosis not present

## 2017-04-08 DIAGNOSIS — H26491 Other secondary cataract, right eye: Secondary | ICD-10-CM | POA: Diagnosis not present

## 2017-04-30 DIAGNOSIS — L821 Other seborrheic keratosis: Secondary | ICD-10-CM | POA: Diagnosis not present

## 2017-04-30 DIAGNOSIS — C44519 Basal cell carcinoma of skin of other part of trunk: Secondary | ICD-10-CM | POA: Diagnosis not present

## 2017-04-30 DIAGNOSIS — L570 Actinic keratosis: Secondary | ICD-10-CM | POA: Diagnosis not present

## 2017-05-29 ENCOUNTER — Ambulatory Visit (INDEPENDENT_AMBULATORY_CARE_PROVIDER_SITE_OTHER): Payer: PPO | Admitting: *Deleted

## 2017-05-29 DIAGNOSIS — I442 Atrioventricular block, complete: Secondary | ICD-10-CM

## 2017-06-04 NOTE — Progress Notes (Signed)
Remote PPM transmission

## 2017-06-12 ENCOUNTER — Encounter: Payer: Self-pay | Admitting: Cardiology

## 2017-07-28 LAB — CUP PACEART REMOTE DEVICE CHECK
Implantable Lead Implant Date: 19930618
Implantable Lead Location: 753859
Lead Channel Setting Pacing Amplitude: 1 V
MDC IDC LEAD IMPLANT DT: 19930618
MDC IDC LEAD LOCATION: 753860
MDC IDC LEAD SERIAL: 23032978
MDC IDC PG IMPLANT DT: 20140516
MDC IDC SESS DTM: 20181009074839
MDC IDC SET LEADCHNL RA PACING AMPLITUDE: 1.625
MDC IDC SET LEADCHNL RV PACING PULSEWIDTH: 0.5 ms
MDC IDC SET LEADCHNL RV SENSING SENSITIVITY: 6 mV
Pulse Gen Model: 2210
Pulse Gen Serial Number: 7474476

## 2017-08-04 ENCOUNTER — Other Ambulatory Visit: Payer: Self-pay | Admitting: Internal Medicine

## 2017-09-08 DIAGNOSIS — Z961 Presence of intraocular lens: Secondary | ICD-10-CM | POA: Diagnosis not present

## 2017-09-08 DIAGNOSIS — H52223 Regular astigmatism, bilateral: Secondary | ICD-10-CM | POA: Diagnosis not present

## 2017-09-08 DIAGNOSIS — H5203 Hypermetropia, bilateral: Secondary | ICD-10-CM | POA: Diagnosis not present

## 2017-09-08 DIAGNOSIS — Z9842 Cataract extraction status, left eye: Secondary | ICD-10-CM | POA: Diagnosis not present

## 2017-09-08 DIAGNOSIS — I10 Essential (primary) hypertension: Secondary | ICD-10-CM | POA: Diagnosis not present

## 2017-09-08 DIAGNOSIS — H524 Presbyopia: Secondary | ICD-10-CM | POA: Diagnosis not present

## 2017-09-08 DIAGNOSIS — Z9841 Cataract extraction status, right eye: Secondary | ICD-10-CM | POA: Diagnosis not present

## 2017-09-09 ENCOUNTER — Ambulatory Visit (INDEPENDENT_AMBULATORY_CARE_PROVIDER_SITE_OTHER): Payer: PPO | Admitting: *Deleted

## 2017-09-09 ENCOUNTER — Telehealth: Payer: Self-pay | Admitting: Cardiology

## 2017-09-09 DIAGNOSIS — I442 Atrioventricular block, complete: Secondary | ICD-10-CM | POA: Diagnosis not present

## 2017-09-09 NOTE — Telephone Encounter (Signed)
LMOVM reminding pt to send remote transmission.   

## 2017-09-09 NOTE — Progress Notes (Signed)
Remote pacemaker transmission.   

## 2017-09-14 DIAGNOSIS — Z1331 Encounter for screening for depression: Secondary | ICD-10-CM | POA: Diagnosis not present

## 2017-09-14 DIAGNOSIS — E1169 Type 2 diabetes mellitus with other specified complication: Secondary | ICD-10-CM | POA: Diagnosis not present

## 2017-09-14 DIAGNOSIS — I10 Essential (primary) hypertension: Secondary | ICD-10-CM | POA: Diagnosis not present

## 2017-09-14 DIAGNOSIS — E785 Hyperlipidemia, unspecified: Secondary | ICD-10-CM | POA: Diagnosis not present

## 2017-09-14 DIAGNOSIS — Z79899 Other long term (current) drug therapy: Secondary | ICD-10-CM | POA: Diagnosis not present

## 2017-09-14 DIAGNOSIS — Z125 Encounter for screening for malignant neoplasm of prostate: Secondary | ICD-10-CM | POA: Diagnosis not present

## 2017-09-14 DIAGNOSIS — Z1389 Encounter for screening for other disorder: Secondary | ICD-10-CM | POA: Diagnosis not present

## 2017-09-14 DIAGNOSIS — Z1211 Encounter for screening for malignant neoplasm of colon: Secondary | ICD-10-CM | POA: Diagnosis not present

## 2017-09-14 DIAGNOSIS — Z9181 History of falling: Secondary | ICD-10-CM | POA: Diagnosis not present

## 2017-09-14 DIAGNOSIS — E559 Vitamin D deficiency, unspecified: Secondary | ICD-10-CM | POA: Diagnosis not present

## 2017-09-14 DIAGNOSIS — Z0000 Encounter for general adult medical examination without abnormal findings: Secondary | ICD-10-CM | POA: Diagnosis not present

## 2017-09-18 ENCOUNTER — Encounter: Payer: Self-pay | Admitting: Cardiology

## 2017-09-21 DIAGNOSIS — M25561 Pain in right knee: Secondary | ICD-10-CM | POA: Diagnosis not present

## 2017-09-21 DIAGNOSIS — M25562 Pain in left knee: Secondary | ICD-10-CM | POA: Diagnosis not present

## 2017-09-21 DIAGNOSIS — Z23 Encounter for immunization: Secondary | ICD-10-CM | POA: Diagnosis not present

## 2017-09-29 LAB — CUP PACEART REMOTE DEVICE CHECK
Battery Remaining Longevity: 100 mo
Battery Remaining Percentage: 91 %
Battery Voltage: 2.95 V
Brady Statistic AP VP Percent: 98 %
Brady Statistic AP VS Percent: 1 %
Brady Statistic RA Percent Paced: 98 %
Brady Statistic RV Percent Paced: 99 %
Implantable Lead Implant Date: 19930618
Implantable Lead Location: 753860
Implantable Lead Serial Number: 23032978
Implantable Pulse Generator Implant Date: 20140516
Lead Channel Impedance Value: 400 Ohm
Lead Channel Pacing Threshold Amplitude: 0.5 V
Lead Channel Pacing Threshold Amplitude: 0.625 V
Lead Channel Pacing Threshold Pulse Width: 0.5 ms
Lead Channel Sensing Intrinsic Amplitude: 1.6 mV
Lead Channel Setting Pacing Amplitude: 1.5 V
Lead Channel Setting Sensing Sensitivity: 6 mV
MDC IDC LEAD IMPLANT DT: 19930618
MDC IDC LEAD LOCATION: 753859
MDC IDC MSMT LEADCHNL RV IMPEDANCE VALUE: 400 Ohm
MDC IDC MSMT LEADCHNL RV PACING THRESHOLD PULSEWIDTH: 0.5 ms
MDC IDC PG SERIAL: 7474476
MDC IDC SESS DTM: 20181121193320
MDC IDC SET LEADCHNL RV PACING AMPLITUDE: 0.875
MDC IDC SET LEADCHNL RV PACING PULSEWIDTH: 0.5 ms
MDC IDC STAT BRADY AS VP PERCENT: 1.5 %
MDC IDC STAT BRADY AS VS PERCENT: 1 %

## 2017-11-25 DIAGNOSIS — E1169 Type 2 diabetes mellitus with other specified complication: Secondary | ICD-10-CM | POA: Diagnosis not present

## 2017-11-25 DIAGNOSIS — R809 Proteinuria, unspecified: Secondary | ICD-10-CM | POA: Diagnosis not present

## 2017-11-25 DIAGNOSIS — E785 Hyperlipidemia, unspecified: Secondary | ICD-10-CM | POA: Diagnosis not present

## 2017-11-25 DIAGNOSIS — N182 Chronic kidney disease, stage 2 (mild): Secondary | ICD-10-CM | POA: Diagnosis not present

## 2017-11-25 DIAGNOSIS — E1122 Type 2 diabetes mellitus with diabetic chronic kidney disease: Secondary | ICD-10-CM | POA: Diagnosis not present

## 2017-12-03 DIAGNOSIS — M179 Osteoarthritis of knee, unspecified: Secondary | ICD-10-CM | POA: Diagnosis not present

## 2017-12-03 DIAGNOSIS — M25561 Pain in right knee: Secondary | ICD-10-CM | POA: Diagnosis not present

## 2017-12-08 LAB — CUP PACEART INCLINIC DEVICE CHECK
Date Time Interrogation Session: 20190219131952
Implantable Lead Implant Date: 19930618
Implantable Lead Serial Number: 23032978
Lead Channel Setting Pacing Amplitude: 1.625
Lead Channel Setting Pacing Pulse Width: 0.5 ms
Lead Channel Setting Sensing Sensitivity: 6 mV
MDC IDC LEAD IMPLANT DT: 19930618
MDC IDC LEAD LOCATION: 753859
MDC IDC LEAD LOCATION: 753860
MDC IDC PG IMPLANT DT: 20140516
MDC IDC PG SERIAL: 7474476
MDC IDC SET LEADCHNL RV PACING AMPLITUDE: 1 V

## 2017-12-09 ENCOUNTER — Ambulatory Visit (INDEPENDENT_AMBULATORY_CARE_PROVIDER_SITE_OTHER): Payer: PPO | Admitting: *Deleted

## 2017-12-09 DIAGNOSIS — I442 Atrioventricular block, complete: Secondary | ICD-10-CM

## 2017-12-09 NOTE — Progress Notes (Signed)
Remote pacemaker transmission.   

## 2017-12-10 ENCOUNTER — Encounter: Payer: Self-pay | Admitting: Cardiology

## 2017-12-17 LAB — CUP PACEART REMOTE DEVICE CHECK
Battery Remaining Longevity: 89 mo
Battery Voltage: 2.93 V
Brady Statistic RA Percent Paced: 98 %
Brady Statistic RV Percent Paced: 99 %
Date Time Interrogation Session: 20190220070009
Implantable Lead Implant Date: 19930618
Implantable Lead Location: 753860
Implantable Pulse Generator Implant Date: 20140516
Lead Channel Impedance Value: 400 Ohm
Lead Channel Impedance Value: 400 Ohm
Lead Channel Pacing Threshold Amplitude: 0.875 V
Lead Channel Pacing Threshold Pulse Width: 0.5 ms
Lead Channel Sensing Intrinsic Amplitude: 1.8 mV
MDC IDC LEAD IMPLANT DT: 19930618
MDC IDC LEAD LOCATION: 753859
MDC IDC LEAD SERIAL: 23032978
MDC IDC MSMT BATTERY REMAINING PERCENTAGE: 81 %
MDC IDC MSMT LEADCHNL RV PACING THRESHOLD AMPLITUDE: 0.75 V
MDC IDC MSMT LEADCHNL RV PACING THRESHOLD PULSEWIDTH: 0.5 ms
MDC IDC SET LEADCHNL RA PACING AMPLITUDE: 1.875
MDC IDC SET LEADCHNL RV PACING AMPLITUDE: 1 V
MDC IDC SET LEADCHNL RV PACING PULSEWIDTH: 0.5 ms
MDC IDC SET LEADCHNL RV SENSING SENSITIVITY: 6 mV
MDC IDC STAT BRADY AP VP PERCENT: 98 %
MDC IDC STAT BRADY AP VS PERCENT: 1 %
MDC IDC STAT BRADY AS VP PERCENT: 1.6 %
MDC IDC STAT BRADY AS VS PERCENT: 1 %
Pulse Gen Model: 2210
Pulse Gen Serial Number: 7474476

## 2017-12-23 DIAGNOSIS — E559 Vitamin D deficiency, unspecified: Secondary | ICD-10-CM | POA: Diagnosis not present

## 2017-12-23 DIAGNOSIS — Z79899 Other long term (current) drug therapy: Secondary | ICD-10-CM | POA: Diagnosis not present

## 2017-12-23 DIAGNOSIS — E785 Hyperlipidemia, unspecified: Secondary | ICD-10-CM | POA: Diagnosis not present

## 2017-12-23 DIAGNOSIS — N182 Chronic kidney disease, stage 2 (mild): Secondary | ICD-10-CM | POA: Diagnosis not present

## 2017-12-23 DIAGNOSIS — N401 Enlarged prostate with lower urinary tract symptoms: Secondary | ICD-10-CM | POA: Diagnosis not present

## 2017-12-23 DIAGNOSIS — E1169 Type 2 diabetes mellitus with other specified complication: Secondary | ICD-10-CM | POA: Diagnosis not present

## 2017-12-23 DIAGNOSIS — I10 Essential (primary) hypertension: Secondary | ICD-10-CM | POA: Diagnosis not present

## 2017-12-23 DIAGNOSIS — E1122 Type 2 diabetes mellitus with diabetic chronic kidney disease: Secondary | ICD-10-CM | POA: Diagnosis not present

## 2017-12-30 DIAGNOSIS — M1712 Unilateral primary osteoarthritis, left knee: Secondary | ICD-10-CM | POA: Diagnosis not present

## 2018-01-13 DIAGNOSIS — M1711 Unilateral primary osteoarthritis, right knee: Secondary | ICD-10-CM | POA: Diagnosis not present

## 2018-03-03 ENCOUNTER — Encounter: Payer: Self-pay | Admitting: Cardiovascular Disease

## 2018-03-03 ENCOUNTER — Ambulatory Visit (INDEPENDENT_AMBULATORY_CARE_PROVIDER_SITE_OTHER): Payer: PPO | Admitting: Cardiovascular Disease

## 2018-03-03 VITALS — BP 127/62 | HR 70 | Ht 68.0 in | Wt 140.8 lb

## 2018-03-03 DIAGNOSIS — I480 Paroxysmal atrial fibrillation: Secondary | ICD-10-CM | POA: Diagnosis not present

## 2018-03-03 DIAGNOSIS — Z7901 Long term (current) use of anticoagulants: Secondary | ICD-10-CM

## 2018-03-03 DIAGNOSIS — I442 Atrioventricular block, complete: Secondary | ICD-10-CM | POA: Diagnosis not present

## 2018-03-03 DIAGNOSIS — Z950 Presence of cardiac pacemaker: Secondary | ICD-10-CM

## 2018-03-03 MED ORDER — ATORVASTATIN CALCIUM 20 MG PO TABS: 20.0000 mg | ORAL_TABLET | Freq: Every day | ORAL | 3 refills | Status: DC

## 2018-03-03 NOTE — Patient Instructions (Signed)
Dr Sallyanne Kuster recommends that you continue on your current medications as directed. Please refer to the Current Medication list given to you today.  Remote monitoring is used to monitor your Pacemaker or ICD from home. This monitoring reduces the number of office visits required to check your device to one time per year. It allows [place] to keep an eye on the functioning of your device to ensure it is working properly. You are scheduled for a device check from home on Wednesday, May 22nd, 2019. You may send your transmission at any time that day. If you have a wireless device, the transmission will be sent automatically. After your physician reviews your transmission, you will receive a notification with your next transmission date.  To improve our patient care and to more adequately follow your device, [facility] has decided, as a practice, to start following each patient four times a year with your home monitor. This means that you may experience a remote appointment that is close to an in-office appointment with your physician. Your insurance will apply at the same rate as other remote monitoring transmissions.  Dr Sallyanne Kuster recommends that you schedule a follow-up appointment in 12 months with a pacemaker check. You will receive a reminder letter in the mail two months in advance. If you don't receive a letter, please call our office to schedule the follow-up appointment.  If you need a refill on your cardiac medications before your next appointment, please call your pharmacy.

## 2018-03-03 NOTE — Progress Notes (Signed)
Patient ID: Robert Cuevas, male   DOB: [birth year], 84 y.o.   MRN: [id number]    Cardiology Office Note    Date:  03/03/2018   ID:  Robert Cuevas, DOB 09-Apr-1930, MRN [id number]  PCP:  Charletta Cousin, MD (Inactive)  Cardiologist:   Sanda Klein, MD   Chief Complaint  Patient presents with  . Pacemaker Check    Complete heart block    History of Present Illness:  Robert Cuevas is a 84 y.o. male with complete heart block, infrequent paroxysmal atrial fibrillation, dual-chamber permanent pacemaker(current generator implanted in 2014 in the right subclavian area after he had a sterile left subclavian pocket erosion).  He is feeling well.  He put down 30 rows of vegetables a few days ago.  He is limited by bilateral knee pain which has not improved with conservative therapy.  He is considering knee replacement surgery.  The patient specifically denies any chest pain at rest exertion, dyspnea at rest or with exertion, orthopnea, paroxysmal nocturnal dyspnea, syncope, palpitations, focal neurological deficits, intermittent claudication, lower extremity edema, unexplained weight gain, cough, hemoptysis or wheezing.  He has not had any bleeding problems  Pacemaker interrogation shows normal function. He hasn't roughly 1-7 0.6 years left on his dual-chamber St. Jude pacemaker implanted in 2014. There is 98% atrial pacing and >99% ventricular pacing. He has had a few episodes of paroxysmal atrial fibrillation but the longest one is only 45 minutes in duration.  Does have several episodes of atrial "noise" on his [facility] 2088 atrial lead, which makes it hard to judge with the true burden of atrial fibrillation might be, but the overall prevalence of mode switch is less than 1%.  He has known coronary artery calcifications by CT but had a normal nuclear stress test in 2012 (pacing induced septal artifact). He has normal left ventricular systolic function and does not have meaningful valvular  abnormalities.  His primary care provider is Dr. Sydnee Cabal, M.D. at the [facility], [address], [place], [place] [address]  Past Medical History:  Diagnosis Date  . Cancer ([place])    skin cancer removed from head  . Complete heart block ([place])    a. s/p gen change 07/2011. b. Pacer pocket infx 02/2013 - s/p extraction, temp perm placement, then eventual implantation of new St. Jude pacemaker 03/04/13.  . Coronary artery calcification    Seen on CT 02/2013.  . High cholesterol   . HOH (hard of hearing)   . Hyperglycemia   . Hypertension   . Pacemaker- St Jude  03/06/2013   Extracted 5/14 Reimplant 5/14   . Paroxysmal atrial fibrillation ([place]) 01/04/2015    Past Surgical History:  Procedure Laterality Date  . GENERATOR REMOVAL N/A 03/02/2013   Procedure: GENERATOR REMOVAL;  Surgeon: Evans Lance, MD;  Location: [place];  Service: Cardiovascular;  Laterality: N/A;  . ICD LEAD REMOVAL N/A 03/02/2013   Procedure: ICD LEAD REMOVAL;  Surgeon: Evans Lance, MD;  Location: [place];  Service: Cardiovascular;  Laterality: N/A;  . INGUINAL HERNIA REPAIR Bilateral   . PACEMAKER GENERATOR CHANGE N/A 09/19/2011   Procedure: PACEMAKER GENERATOR CHANGE;  Surgeon: Sanda Klein, MD;  Location: [place] CATH LAB;  Service: Cardiovascular;  Laterality: N/A;  . PACEMAKER INSERTION    . PACEMAKER LEAD REMOVAL N/A 03/02/2013   Procedure: PACEMAKER LEAD REMOVAL;  Surgeon: Evans Lance, MD;  Location: [place];  Service: Cardiovascular;  Laterality: N/A;  . PERMANENT PACEMAKER INSERTION  N/A 03/04/2013   Procedure: PERMANENT PACEMAKER INSERTION;  Surgeon: Evans Lance, MD;  Location: [facility] CATH LAB;  Service: Cardiovascular;  Laterality: N/A;    Current Medications: Outpatient Medications Prior to Visit  Medication Sig Dispense Refill  . apixaban (ELIQUIS) 5 MG TABS tablet Take 1 tablet (5 mg total) by mouth 2 (two) times daily. 60 tablet 6  . metFORMIN (GLUCOPHAGE) 500 MG tablet Take by mouth 2 (two)  times daily with a meal.    . metoprolol (LOPRESSOR) 50 MG tablet Take 1 tablet (50 mg total) by mouth 2 (two) times daily.    . Omega-3 Fatty Acids (FISH OIL) 1200 MG CAPS Take 2,400 mg by mouth daily.    . ramipril (ALTACE) 2.5 MG capsule Take 1 capsule by mouth daily.    Marland Kitchen atorvastatin (LIPITOR) 20 MG tablet TAKE ONE TABLET BY MOUTH ONCE DAILY 90 tablet 0   No facility-administered medications prior to visit.      Allergies:   Patient has no known allergies.   Social History   Socioeconomic History  . Marital status: Married    Spouse name: Not on file  . Number of children: Not on file  . Years of education: Not on file  . Highest education level: Not on file  Occupational History  . Not on file  Social Needs  . Financial resource strain: Not on file  . Food insecurity:    Worry: Not on file    Inability: Not on file  . Transportation needs:    Medical: Not on file    Non-medical: Not on file  Tobacco Use  . Smoking status: Never Smoker  . Smokeless tobacco: Never Used  Substance and Sexual Activity  . Alcohol use: No  . Drug use: No  . Sexual activity: Not on file  Lifestyle  . Physical activity:    Days per week: Not on file    Minutes per session: Not on file  . Stress: Not on file  Relationships  . Social connections:    Talks on phone: Not on file    Gets together: Not on file    Attends religious service: Not on file    Active member of club or organization: Not on file    Attends meetings of clubs or organizations: Not on file    Relationship status: Not on file  Other Topics Concern  . Not on file  Social History Narrative  . Not on file      ROS:   Please see the history of present illness.    ROS All other systems reviewed and are negative.   PHYSICAL EXAM:   VS:  BP 127/62   Pulse 70   Ht 5\' 8"  (1.727 m)   Wt 140 lb 12.8 oz (63.9 kg)   BMI 21.41 kg/m       General: Alert, oriented x3, no distress, appears lean and fit, numerous skin  lesions consistent with sun exposure Head: no evidence of trauma, PERRL, EOMI, no exophtalmos or lid lag, no myxedema, no xanthelasma; normal ears, nose and oropharynx Neck: normal jugular venous pulsations and no hepatojugular reflux; brisk carotid pulses without delay and no carotid bruits Chest: clear to auscultation, no signs of consolidation by percussion or palpation, normal fremitus, symmetrical and full respiratory excursions.  Healthy left subclavian pacemaker site and well-healed scar of previous contralateral pacemaker site Cardiovascular: normal position and quality of the apical impulse, regular rhythm, normal first and her dog sickly split second  heart sounds, no murmurs, rubs or gallops Abdomen: no tenderness or distention, no masses by palpation, no abnormal pulsatility or arterial bruits, normal bowel sounds, no hepatosplenomegaly Extremities: no clubbing, cyanosis or edema; 2+ radial, ulnar and brachial pulses bilaterally; 2+ right femoral, posterior tibial and dorsalis pedis pulses; 2+ left femoral, posterior tibial and dorsalis pedis pulses; no subclavian or femoral bruits Neurological: grossly nonfocal Psych: Normal mood and affect    Wt Readings from Last 3 Encounters:  03/03/18 140 lb 12.8 oz (63.9 kg)  02/27/17 146 lb (66.2 kg)  02/21/16 142 lb (64.4 kg)      Studies/Labs Reviewed:   EKG:  EKG is ordered today.  Atrioventricular sequential pacing is seen Recent Labs: Performed at the [place]. the most recent results I have her from last year: Hemoglobin A1c 7.7%, TSH 1.74, creatinine 0.7, total bili 2.8 but other LFTs normal, potassium 4.5. Total cholesterol 109, triglycerides 105, HDL 32, LDL 56 Has had more recent labs and tells me that they were unchanged and his [place] found him to be satisfactory.  He will send me a copy.  ASSESSMENT:    1. Complete heart block (HCC)   2. Paroxysmal atrial fibrillation (HCC)      PLAN:  In order of problems listed  above:  1. CHB: He is pacemaker dependent 2. Afib: Episodes are asymptomatic and infrequent.Marland Kitchen CHADSVasc 4 (age 68, DM, HTN). On anticoagulation without bleeding complications.  Difficult to know Lunette Stands what the overall burden of atrial fibrillation is due to the atrial noise from his [facility], but the episodes of true atrial fibrillation appear to be brief and infrequent. 3. PPM: Normal pacemaker function. Remote downloads every 3 months, yearly visits. Current device was reimplanted on the contralateral side after sterile pocket erosion 5 years ago. 4. Eliquis: Well tolerated, without bleeding complications.  If he does decide to proceed with knee replacement surgery, I think he is at low risk for major perioperative cardiovascular complications.  He is pacemaker dependent, but it is unlikely that a limb procedure will interfere with normal pacemaker function.  Anticoagulation can be briefly interrupted for the procedure and should be restarted as soon as possible afterwards since will also protect him from venous thromboembolic complications.   Medication Adjustments/Labs and Tests Ordered: Current medicines are reviewed at length with the patient today.  Concerns regarding medicines are outlined above.  Medication changes, Labs and Tests ordered today are listed in the Patient Instructions below. Patient Instructions  Dr Sallyanne Kuster recommends that you continue on your current medications as directed. Please refer to the Current Medication list given to you today.  Remote monitoring is used to monitor your Pacemaker or ICD from home. This monitoring reduces the number of office visits required to check your device to one time per year. It allows [place] to keep an eye on the functioning of your device to ensure it is working properly. You are scheduled for a device check from home on Wednesday, May 22nd, 2019. You may send your transmission at any time that day. If you have a wireless device,  the transmission will be sent automatically. After your physician reviews your transmission, you will receive a notification with your next transmission date.  To improve our patient care and to more adequately follow your device, [facility] has decided, as a practice, to start following each patient four times a year with your home monitor. This means that you may experience a remote appointment that is close to an  in-office appointment with your physician. Your insurance will apply at the same rate as other remote monitoring transmissions.  Dr Sallyanne Kuster recommends that you schedule a follow-up appointment in 12 months with a pacemaker check. You will receive a reminder letter in the mail two months in advance. If you don't receive a letter, please call our office to schedule the follow-up appointment.  If you need a refill on your cardiac medications before your next appointment, please call your pharmacy.    Signed, Sanda Klein, MD  03/03/2018 5:29 PM    [facility] [place], [place], [place]  [address] Phone: [phone]; Fax: [phone]

## 2018-03-10 ENCOUNTER — Ambulatory Visit (INDEPENDENT_AMBULATORY_CARE_PROVIDER_SITE_OTHER): Payer: PPO | Admitting: *Deleted

## 2018-03-10 DIAGNOSIS — I442 Atrioventricular block, complete: Secondary | ICD-10-CM

## 2018-03-10 DIAGNOSIS — Z9181 History of falling: Secondary | ICD-10-CM | POA: Diagnosis not present

## 2018-03-10 DIAGNOSIS — Z136 Encounter for screening for cardiovascular disorders: Secondary | ICD-10-CM | POA: Diagnosis not present

## 2018-03-10 DIAGNOSIS — I480 Paroxysmal atrial fibrillation: Secondary | ICD-10-CM

## 2018-03-10 DIAGNOSIS — Z1331 Encounter for screening for depression: Secondary | ICD-10-CM | POA: Diagnosis not present

## 2018-03-10 DIAGNOSIS — E785 Hyperlipidemia, unspecified: Secondary | ICD-10-CM | POA: Diagnosis not present

## 2018-03-10 DIAGNOSIS — Z139 Encounter for screening, unspecified: Secondary | ICD-10-CM | POA: Diagnosis not present

## 2018-03-10 DIAGNOSIS — Z125 Encounter for screening for malignant neoplasm of prostate: Secondary | ICD-10-CM | POA: Diagnosis not present

## 2018-03-10 DIAGNOSIS — Z0000 Encounter for general adult medical examination without abnormal findings: Secondary | ICD-10-CM | POA: Diagnosis not present

## 2018-03-10 NOTE — Progress Notes (Signed)
Remote pacemaker transmission.   

## 2018-03-11 LAB — CUP PACEART REMOTE DEVICE CHECK
Battery Remaining Longevity: 85 mo
Battery Remaining Percentage: 81 %
Battery Voltage: 2.93 V
Brady Statistic AS VP Percent: 1.5 %
Brady Statistic RA Percent Paced: 99 %
Date Time Interrogation Session: 20190522071323
Implantable Lead Implant Date: 19930618
Implantable Lead Location: 753859
Implantable Pulse Generator Implant Date: 20140516
Lead Channel Pacing Threshold Amplitude: 1 V
Lead Channel Setting Pacing Amplitude: 1.125
Lead Channel Setting Pacing Pulse Width: 0.5 ms
MDC IDC LEAD IMPLANT DT: 19930618
MDC IDC LEAD LOCATION: 753860
MDC IDC LEAD SERIAL: 23032978
MDC IDC MSMT LEADCHNL RA IMPEDANCE VALUE: 390 Ohm
MDC IDC MSMT LEADCHNL RA PACING THRESHOLD PULSEWIDTH: 0.5 ms
MDC IDC MSMT LEADCHNL RA SENSING INTR AMPL: 1.8 mV
MDC IDC MSMT LEADCHNL RV IMPEDANCE VALUE: 390 Ohm
MDC IDC MSMT LEADCHNL RV PACING THRESHOLD AMPLITUDE: 0.875 V
MDC IDC MSMT LEADCHNL RV PACING THRESHOLD PULSEWIDTH: 0.5 ms
MDC IDC SET LEADCHNL RA PACING AMPLITUDE: 2 V
MDC IDC SET LEADCHNL RV SENSING SENSITIVITY: 6 mV
MDC IDC STAT BRADY AP VP PERCENT: 99 %
MDC IDC STAT BRADY AP VS PERCENT: 1 %
MDC IDC STAT BRADY AS VS PERCENT: 0 %
MDC IDC STAT BRADY RV PERCENT PACED: 99 %
Pulse Gen Model: 2210
Pulse Gen Serial Number: 7474476

## 2018-04-29 DIAGNOSIS — M1712 Unilateral primary osteoarthritis, left knee: Secondary | ICD-10-CM | POA: Diagnosis not present

## 2018-05-05 DIAGNOSIS — R2689 Other abnormalities of gait and mobility: Secondary | ICD-10-CM | POA: Diagnosis not present

## 2018-05-05 DIAGNOSIS — M25662 Stiffness of left knee, not elsewhere classified: Secondary | ICD-10-CM | POA: Diagnosis not present

## 2018-05-05 DIAGNOSIS — M25661 Stiffness of right knee, not elsewhere classified: Secondary | ICD-10-CM | POA: Diagnosis not present

## 2018-05-05 DIAGNOSIS — M25561 Pain in right knee: Secondary | ICD-10-CM | POA: Diagnosis not present

## 2018-05-05 DIAGNOSIS — M25562 Pain in left knee: Secondary | ICD-10-CM | POA: Diagnosis not present

## 2018-05-10 DIAGNOSIS — M25562 Pain in left knee: Secondary | ICD-10-CM | POA: Diagnosis not present

## 2018-05-10 DIAGNOSIS — M25661 Stiffness of right knee, not elsewhere classified: Secondary | ICD-10-CM | POA: Diagnosis not present

## 2018-05-10 DIAGNOSIS — M25561 Pain in right knee: Secondary | ICD-10-CM | POA: Diagnosis not present

## 2018-05-10 DIAGNOSIS — R2689 Other abnormalities of gait and mobility: Secondary | ICD-10-CM | POA: Diagnosis not present

## 2018-05-10 DIAGNOSIS — M25662 Stiffness of left knee, not elsewhere classified: Secondary | ICD-10-CM | POA: Diagnosis not present

## 2018-05-12 DIAGNOSIS — M25662 Stiffness of left knee, not elsewhere classified: Secondary | ICD-10-CM | POA: Diagnosis not present

## 2018-05-12 DIAGNOSIS — R2689 Other abnormalities of gait and mobility: Secondary | ICD-10-CM | POA: Diagnosis not present

## 2018-05-12 DIAGNOSIS — M25561 Pain in right knee: Secondary | ICD-10-CM | POA: Diagnosis not present

## 2018-05-12 DIAGNOSIS — M25562 Pain in left knee: Secondary | ICD-10-CM | POA: Diagnosis not present

## 2018-05-12 DIAGNOSIS — M25661 Stiffness of right knee, not elsewhere classified: Secondary | ICD-10-CM | POA: Diagnosis not present

## 2018-05-17 DIAGNOSIS — R2689 Other abnormalities of gait and mobility: Secondary | ICD-10-CM | POA: Diagnosis not present

## 2018-05-17 DIAGNOSIS — M25561 Pain in right knee: Secondary | ICD-10-CM | POA: Diagnosis not present

## 2018-05-17 DIAGNOSIS — M25662 Stiffness of left knee, not elsewhere classified: Secondary | ICD-10-CM | POA: Diagnosis not present

## 2018-05-17 DIAGNOSIS — M25661 Stiffness of right knee, not elsewhere classified: Secondary | ICD-10-CM | POA: Diagnosis not present

## 2018-05-17 DIAGNOSIS — M25562 Pain in left knee: Secondary | ICD-10-CM | POA: Diagnosis not present

## 2018-05-19 DIAGNOSIS — M25662 Stiffness of left knee, not elsewhere classified: Secondary | ICD-10-CM | POA: Diagnosis not present

## 2018-05-19 DIAGNOSIS — M25561 Pain in right knee: Secondary | ICD-10-CM | POA: Diagnosis not present

## 2018-05-19 DIAGNOSIS — R2689 Other abnormalities of gait and mobility: Secondary | ICD-10-CM | POA: Diagnosis not present

## 2018-05-19 DIAGNOSIS — M25661 Stiffness of right knee, not elsewhere classified: Secondary | ICD-10-CM | POA: Diagnosis not present

## 2018-05-19 DIAGNOSIS — M25562 Pain in left knee: Secondary | ICD-10-CM | POA: Diagnosis not present

## 2018-05-24 DIAGNOSIS — R2689 Other abnormalities of gait and mobility: Secondary | ICD-10-CM | POA: Diagnosis not present

## 2018-05-24 DIAGNOSIS — M25562 Pain in left knee: Secondary | ICD-10-CM | POA: Diagnosis not present

## 2018-05-24 DIAGNOSIS — M25661 Stiffness of right knee, not elsewhere classified: Secondary | ICD-10-CM | POA: Diagnosis not present

## 2018-05-24 DIAGNOSIS — M25662 Stiffness of left knee, not elsewhere classified: Secondary | ICD-10-CM | POA: Diagnosis not present

## 2018-05-24 DIAGNOSIS — M25561 Pain in right knee: Secondary | ICD-10-CM | POA: Diagnosis not present

## 2018-05-26 DIAGNOSIS — M25561 Pain in right knee: Secondary | ICD-10-CM | POA: Diagnosis not present

## 2018-05-26 DIAGNOSIS — M25661 Stiffness of right knee, not elsewhere classified: Secondary | ICD-10-CM | POA: Diagnosis not present

## 2018-05-26 DIAGNOSIS — M25662 Stiffness of left knee, not elsewhere classified: Secondary | ICD-10-CM | POA: Diagnosis not present

## 2018-05-26 DIAGNOSIS — R2689 Other abnormalities of gait and mobility: Secondary | ICD-10-CM | POA: Diagnosis not present

## 2018-05-26 DIAGNOSIS — M25562 Pain in left knee: Secondary | ICD-10-CM | POA: Diagnosis not present

## 2018-06-02 DIAGNOSIS — M25561 Pain in right knee: Secondary | ICD-10-CM | POA: Diagnosis not present

## 2018-06-02 DIAGNOSIS — R2689 Other abnormalities of gait and mobility: Secondary | ICD-10-CM | POA: Diagnosis not present

## 2018-06-02 DIAGNOSIS — M25662 Stiffness of left knee, not elsewhere classified: Secondary | ICD-10-CM | POA: Diagnosis not present

## 2018-06-02 DIAGNOSIS — M25661 Stiffness of right knee, not elsewhere classified: Secondary | ICD-10-CM | POA: Diagnosis not present

## 2018-06-02 DIAGNOSIS — M25562 Pain in left knee: Secondary | ICD-10-CM | POA: Diagnosis not present

## 2018-06-04 DIAGNOSIS — M25561 Pain in right knee: Secondary | ICD-10-CM | POA: Diagnosis not present

## 2018-06-04 DIAGNOSIS — M25661 Stiffness of right knee, not elsewhere classified: Secondary | ICD-10-CM | POA: Diagnosis not present

## 2018-06-04 DIAGNOSIS — M25662 Stiffness of left knee, not elsewhere classified: Secondary | ICD-10-CM | POA: Diagnosis not present

## 2018-06-04 DIAGNOSIS — M25562 Pain in left knee: Secondary | ICD-10-CM | POA: Diagnosis not present

## 2018-06-04 DIAGNOSIS — R2689 Other abnormalities of gait and mobility: Secondary | ICD-10-CM | POA: Diagnosis not present

## 2018-06-09 ENCOUNTER — Ambulatory Visit (INDEPENDENT_AMBULATORY_CARE_PROVIDER_SITE_OTHER): Payer: PPO | Admitting: *Deleted

## 2018-06-09 DIAGNOSIS — I442 Atrioventricular block, complete: Secondary | ICD-10-CM

## 2018-06-09 DIAGNOSIS — M25662 Stiffness of left knee, not elsewhere classified: Secondary | ICD-10-CM | POA: Diagnosis not present

## 2018-06-09 DIAGNOSIS — M25661 Stiffness of right knee, not elsewhere classified: Secondary | ICD-10-CM | POA: Diagnosis not present

## 2018-06-09 DIAGNOSIS — M25562 Pain in left knee: Secondary | ICD-10-CM | POA: Diagnosis not present

## 2018-06-09 DIAGNOSIS — R2689 Other abnormalities of gait and mobility: Secondary | ICD-10-CM | POA: Diagnosis not present

## 2018-06-09 DIAGNOSIS — M25561 Pain in right knee: Secondary | ICD-10-CM | POA: Diagnosis not present

## 2018-06-09 NOTE — Progress Notes (Signed)
Remote pacemaker transmission.   

## 2018-06-11 ENCOUNTER — Encounter: Payer: Self-pay | Admitting: Cardiology

## 2018-06-11 DIAGNOSIS — M25661 Stiffness of right knee, not elsewhere classified: Secondary | ICD-10-CM | POA: Diagnosis not present

## 2018-06-11 DIAGNOSIS — M25561 Pain in right knee: Secondary | ICD-10-CM | POA: Diagnosis not present

## 2018-06-11 DIAGNOSIS — M25562 Pain in left knee: Secondary | ICD-10-CM | POA: Diagnosis not present

## 2018-06-11 DIAGNOSIS — R2689 Other abnormalities of gait and mobility: Secondary | ICD-10-CM | POA: Diagnosis not present

## 2018-06-11 DIAGNOSIS — M25662 Stiffness of left knee, not elsewhere classified: Secondary | ICD-10-CM | POA: Diagnosis not present

## 2018-06-14 DIAGNOSIS — M25662 Stiffness of left knee, not elsewhere classified: Secondary | ICD-10-CM | POA: Diagnosis not present

## 2018-06-14 DIAGNOSIS — M25661 Stiffness of right knee, not elsewhere classified: Secondary | ICD-10-CM | POA: Diagnosis not present

## 2018-06-14 DIAGNOSIS — R2689 Other abnormalities of gait and mobility: Secondary | ICD-10-CM | POA: Diagnosis not present

## 2018-06-14 DIAGNOSIS — M25561 Pain in right knee: Secondary | ICD-10-CM | POA: Diagnosis not present

## 2018-06-14 DIAGNOSIS — M25562 Pain in left knee: Secondary | ICD-10-CM | POA: Diagnosis not present

## 2018-06-16 DIAGNOSIS — M25661 Stiffness of right knee, not elsewhere classified: Secondary | ICD-10-CM | POA: Diagnosis not present

## 2018-06-16 DIAGNOSIS — M25562 Pain in left knee: Secondary | ICD-10-CM | POA: Diagnosis not present

## 2018-06-16 DIAGNOSIS — M25662 Stiffness of left knee, not elsewhere classified: Secondary | ICD-10-CM | POA: Diagnosis not present

## 2018-06-16 DIAGNOSIS — M25561 Pain in right knee: Secondary | ICD-10-CM | POA: Diagnosis not present

## 2018-06-16 DIAGNOSIS — R2689 Other abnormalities of gait and mobility: Secondary | ICD-10-CM | POA: Diagnosis not present

## 2018-06-18 DIAGNOSIS — H5203 Hypermetropia, bilateral: Secondary | ICD-10-CM | POA: Diagnosis not present

## 2018-06-18 DIAGNOSIS — H524 Presbyopia: Secondary | ICD-10-CM | POA: Diagnosis not present

## 2018-06-18 DIAGNOSIS — H52223 Regular astigmatism, bilateral: Secondary | ICD-10-CM | POA: Diagnosis not present

## 2018-07-16 LAB — CUP PACEART REMOTE DEVICE CHECK
Battery Remaining Percentage: 73 %
Battery Voltage: 2.92 V
Brady Statistic AP VP Percent: 98 %
Brady Statistic AP VS Percent: 1 %
Brady Statistic AS VP Percent: 1.7 %
Brady Statistic AS VS Percent: 1 %
Brady Statistic RV Percent Paced: 99 %
Date Time Interrogation Session: 20190821074417
Implantable Lead Implant Date: 19930618
Implantable Lead Location: 753860
Implantable Lead Serial Number: 23032978
Implantable Pulse Generator Implant Date: 20140516
Lead Channel Impedance Value: 390 Ohm
Lead Channel Impedance Value: 400 Ohm
Lead Channel Pacing Threshold Amplitude: 0.625 V
Lead Channel Pacing Threshold Amplitude: 0.75 V
Lead Channel Pacing Threshold Pulse Width: 0.5 ms
Lead Channel Pacing Threshold Pulse Width: 0.5 ms
Lead Channel Setting Pacing Amplitude: 1.625
Lead Channel Setting Pacing Pulse Width: 0.5 ms
Lead Channel Setting Sensing Sensitivity: 6 mV
MDC IDC LEAD IMPLANT DT: 19930618
MDC IDC LEAD LOCATION: 753859
MDC IDC MSMT BATTERY REMAINING LONGEVITY: 78 mo
MDC IDC MSMT LEADCHNL RA SENSING INTR AMPL: 1.3 mV
MDC IDC PG SERIAL: 7474476
MDC IDC SET LEADCHNL RV PACING AMPLITUDE: 1 V
MDC IDC STAT BRADY RA PERCENT PACED: 98 %

## 2018-08-11 DIAGNOSIS — I499 Cardiac arrhythmia, unspecified: Secondary | ICD-10-CM | POA: Diagnosis not present

## 2018-08-11 DIAGNOSIS — I10 Essential (primary) hypertension: Secondary | ICD-10-CM | POA: Diagnosis not present

## 2018-08-11 DIAGNOSIS — Z950 Presence of cardiac pacemaker: Secondary | ICD-10-CM | POA: Diagnosis not present

## 2018-08-11 DIAGNOSIS — Z23 Encounter for immunization: Secondary | ICD-10-CM | POA: Diagnosis not present

## 2018-08-11 DIAGNOSIS — E1169 Type 2 diabetes mellitus with other specified complication: Secondary | ICD-10-CM | POA: Diagnosis not present

## 2018-08-11 DIAGNOSIS — E1122 Type 2 diabetes mellitus with diabetic chronic kidney disease: Secondary | ICD-10-CM | POA: Diagnosis not present

## 2018-08-11 DIAGNOSIS — N401 Enlarged prostate with lower urinary tract symptoms: Secondary | ICD-10-CM | POA: Diagnosis not present

## 2018-08-11 DIAGNOSIS — E559 Vitamin D deficiency, unspecified: Secondary | ICD-10-CM | POA: Diagnosis not present

## 2018-09-08 ENCOUNTER — Ambulatory Visit (INDEPENDENT_AMBULATORY_CARE_PROVIDER_SITE_OTHER): Payer: PPO

## 2018-09-08 DIAGNOSIS — I442 Atrioventricular block, complete: Secondary | ICD-10-CM

## 2018-09-09 NOTE — Progress Notes (Signed)
Remote pacemaker transmission.   

## 2018-10-19 DIAGNOSIS — M1711 Unilateral primary osteoarthritis, right knee: Secondary | ICD-10-CM | POA: Diagnosis not present

## 2018-10-30 DIAGNOSIS — S60512A Abrasion of left hand, initial encounter: Secondary | ICD-10-CM | POA: Diagnosis not present

## 2018-10-30 DIAGNOSIS — S61412A Laceration without foreign body of left hand, initial encounter: Secondary | ICD-10-CM | POA: Diagnosis not present

## 2018-11-05 LAB — CUP PACEART REMOTE DEVICE CHECK
Battery Remaining Longevity: 73 mo
Brady Statistic AP VS Percent: 1 %
Brady Statistic AS VP Percent: 2.3 %
Implantable Lead Implant Date: 19930618
Implantable Lead Implant Date: 19930618
Implantable Lead Location: 753859
Implantable Lead Location: 753860
Lead Channel Impedance Value: 360 Ohm
Lead Channel Pacing Threshold Amplitude: 0.75 V
Lead Channel Pacing Threshold Pulse Width: 0.5 ms
Lead Channel Setting Pacing Amplitude: 1 V
Lead Channel Setting Pacing Amplitude: 1.5 V
Lead Channel Setting Pacing Pulse Width: 0.5 ms
MDC IDC LEAD SERIAL: 23032978
MDC IDC MSMT BATTERY REMAINING PERCENTAGE: 73 %
MDC IDC MSMT BATTERY VOLTAGE: 2.92 V
MDC IDC MSMT LEADCHNL RA IMPEDANCE VALUE: 380 Ohm
MDC IDC MSMT LEADCHNL RA PACING THRESHOLD AMPLITUDE: 0.5 V
MDC IDC MSMT LEADCHNL RA PACING THRESHOLD PULSEWIDTH: 0.5 ms
MDC IDC MSMT LEADCHNL RA SENSING INTR AMPL: 1.2 mV
MDC IDC PG IMPLANT DT: 20140516
MDC IDC SESS DTM: 20191120070009
MDC IDC SET LEADCHNL RV SENSING SENSITIVITY: 6 mV
MDC IDC STAT BRADY AP VP PERCENT: 98 %
MDC IDC STAT BRADY AS VS PERCENT: 1 %
MDC IDC STAT BRADY RA PERCENT PACED: 98 %
MDC IDC STAT BRADY RV PERCENT PACED: 99 %
Pulse Gen Model: 2210
Pulse Gen Serial Number: 7474476

## 2018-12-08 ENCOUNTER — Ambulatory Visit (INDEPENDENT_AMBULATORY_CARE_PROVIDER_SITE_OTHER): Payer: PPO

## 2018-12-08 DIAGNOSIS — I442 Atrioventricular block, complete: Secondary | ICD-10-CM

## 2018-12-10 LAB — CUP PACEART REMOTE DEVICE CHECK
Battery Remaining Longevity: 59 mo
Battery Remaining Percentage: 57 %
Battery Voltage: 2.89 V
Brady Statistic AP VP Percent: 96 %
Brady Statistic AP VS Percent: 1 %
Brady Statistic AS VP Percent: 3.9 %
Brady Statistic RA Percent Paced: 96 %
Brady Statistic RV Percent Paced: 99 %
Date Time Interrogation Session: 20200219070009
Implantable Lead Implant Date: 19930618
Implantable Lead Implant Date: 19930618
Implantable Lead Location: 753859
Implantable Lead Location: 753860
Implantable Lead Serial Number: 23032978
Implantable Pulse Generator Implant Date: 20140516
Lead Channel Impedance Value: 380 Ohm
Lead Channel Impedance Value: 380 Ohm
Lead Channel Pacing Threshold Amplitude: 0.75 V
Lead Channel Pacing Threshold Amplitude: 1 V
Lead Channel Pacing Threshold Pulse Width: 0.5 ms
Lead Channel Pacing Threshold Pulse Width: 0.5 ms
Lead Channel Setting Pacing Amplitude: 1 V
Lead Channel Setting Pacing Amplitude: 2 V
MDC IDC MSMT LEADCHNL RA SENSING INTR AMPL: 1.7 mV
MDC IDC SET LEADCHNL RV PACING PULSEWIDTH: 0.5 ms
MDC IDC SET LEADCHNL RV SENSING SENSITIVITY: 6 mV
MDC IDC STAT BRADY AS VS PERCENT: 1 %
Pulse Gen Model: 2210
Pulse Gen Serial Number: 7474476

## 2018-12-14 DIAGNOSIS — L570 Actinic keratosis: Secondary | ICD-10-CM | POA: Diagnosis not present

## 2018-12-14 DIAGNOSIS — D485 Neoplasm of uncertain behavior of skin: Secondary | ICD-10-CM | POA: Diagnosis not present

## 2018-12-16 NOTE — Progress Notes (Signed)
Remote pacemaker transmission.   

## 2018-12-17 ENCOUNTER — Encounter: Payer: Self-pay | Admitting: Cardiology

## 2019-02-16 DIAGNOSIS — Z950 Presence of cardiac pacemaker: Secondary | ICD-10-CM | POA: Diagnosis not present

## 2019-02-16 DIAGNOSIS — Z86718 Personal history of other venous thrombosis and embolism: Secondary | ICD-10-CM | POA: Diagnosis not present

## 2019-02-16 DIAGNOSIS — E1169 Type 2 diabetes mellitus with other specified complication: Secondary | ICD-10-CM | POA: Diagnosis not present

## 2019-02-16 DIAGNOSIS — E785 Hyperlipidemia, unspecified: Secondary | ICD-10-CM | POA: Diagnosis not present

## 2019-02-16 DIAGNOSIS — I10 Essential (primary) hypertension: Secondary | ICD-10-CM | POA: Diagnosis not present

## 2019-03-09 ENCOUNTER — Ambulatory Visit (INDEPENDENT_AMBULATORY_CARE_PROVIDER_SITE_OTHER): Payer: PPO | Admitting: *Deleted

## 2019-03-09 DIAGNOSIS — I442 Atrioventricular block, complete: Secondary | ICD-10-CM

## 2019-03-09 LAB — CUP PACEART REMOTE DEVICE CHECK
Date Time Interrogation Session: 20200520180532
Implantable Lead Implant Date: 19930618
Implantable Lead Implant Date: 19930618
Implantable Lead Location: 753859
Implantable Lead Location: 753860
Implantable Lead Serial Number: 23032978
Implantable Pulse Generator Implant Date: 20140516
Pulse Gen Model: 2210
Pulse Gen Serial Number: 7474476

## 2019-03-10 ENCOUNTER — Telehealth: Payer: Self-pay | Admitting: Cardiovascular Disease

## 2019-03-10 NOTE — Telephone Encounter (Signed)
New Message          Patient is calling to see why he didn't get a call. Patient states he was told that he would get a call and he did not recv' one Pls call to advise.

## 2019-03-10 NOTE — Telephone Encounter (Signed)
I think pt may be calling about device appt

## 2019-03-15 NOTE — Telephone Encounter (Signed)
Spoke w/ pt wife and informed her that pt didn't get a call b/c it was a remote transmission appt and pt home monitor is automatic and he didn't have to do anything. Pt wife verbalized understanding.

## 2019-03-17 DIAGNOSIS — Z0000 Encounter for general adult medical examination without abnormal findings: Secondary | ICD-10-CM | POA: Diagnosis not present

## 2019-03-17 DIAGNOSIS — Z9181 History of falling: Secondary | ICD-10-CM | POA: Diagnosis not present

## 2019-03-17 DIAGNOSIS — Z136 Encounter for screening for cardiovascular disorders: Secondary | ICD-10-CM | POA: Diagnosis not present

## 2019-03-17 DIAGNOSIS — Z1331 Encounter for screening for depression: Secondary | ICD-10-CM | POA: Diagnosis not present

## 2019-03-17 DIAGNOSIS — E785 Hyperlipidemia, unspecified: Secondary | ICD-10-CM | POA: Diagnosis not present

## 2019-03-17 DIAGNOSIS — Z1339 Encounter for screening examination for other mental health and behavioral disorders: Secondary | ICD-10-CM | POA: Diagnosis not present

## 2019-03-17 DIAGNOSIS — Z139 Encounter for screening, unspecified: Secondary | ICD-10-CM | POA: Diagnosis not present

## 2019-03-18 ENCOUNTER — Encounter: Payer: Self-pay | Admitting: Cardiology

## 2019-03-18 NOTE — Progress Notes (Signed)
Remote pacemaker transmission.   

## 2019-06-08 ENCOUNTER — Ambulatory Visit (INDEPENDENT_AMBULATORY_CARE_PROVIDER_SITE_OTHER): Payer: PPO | Admitting: *Deleted

## 2019-06-08 DIAGNOSIS — I442 Atrioventricular block, complete: Secondary | ICD-10-CM | POA: Diagnosis not present

## 2019-06-09 LAB — CUP PACEART REMOTE DEVICE CHECK
Battery Remaining Longevity: 53 mo
Battery Remaining Percentage: 51 %
Battery Voltage: 2.87 V
Brady Statistic AP VP Percent: 96 %
Brady Statistic AP VS Percent: 1 %
Brady Statistic AS VP Percent: 3.8 %
Brady Statistic AS VS Percent: 1 %
Brady Statistic RA Percent Paced: 96 %
Brady Statistic RV Percent Paced: 99 %
Date Time Interrogation Session: 20200820075801
Implantable Lead Implant Date: 19930618
Implantable Lead Implant Date: 19930618
Implantable Lead Location: 753859
Implantable Lead Location: 753860
Implantable Lead Serial Number: 23032978
Implantable Pulse Generator Implant Date: 20140516
Lead Channel Impedance Value: 360 Ohm
Lead Channel Impedance Value: 400 Ohm
Lead Channel Pacing Threshold Amplitude: 0.75 V
Lead Channel Pacing Threshold Amplitude: 0.875 V
Lead Channel Pacing Threshold Pulse Width: 0.5 ms
Lead Channel Pacing Threshold Pulse Width: 0.5 ms
Lead Channel Sensing Intrinsic Amplitude: 1.5 mV
Lead Channel Setting Pacing Amplitude: 1.125
Lead Channel Setting Pacing Amplitude: 1.75 V
Lead Channel Setting Pacing Pulse Width: 0.5 ms
Lead Channel Setting Sensing Sensitivity: 6 mV
Pulse Gen Model: 2210
Pulse Gen Serial Number: 7474476

## 2019-06-16 ENCOUNTER — Encounter: Payer: Self-pay | Admitting: Cardiology

## 2019-06-16 NOTE — Progress Notes (Signed)
Remote pacemaker transmission.   

## 2019-06-18 DIAGNOSIS — D485 Neoplasm of uncertain behavior of skin: Secondary | ICD-10-CM | POA: Diagnosis not present

## 2019-06-18 DIAGNOSIS — L821 Other seborrheic keratosis: Secondary | ICD-10-CM | POA: Diagnosis not present

## 2019-06-18 DIAGNOSIS — L814 Other melanin hyperpigmentation: Secondary | ICD-10-CM | POA: Diagnosis not present

## 2019-08-26 DIAGNOSIS — Z23 Encounter for immunization: Secondary | ICD-10-CM | POA: Diagnosis not present

## 2019-09-07 ENCOUNTER — Ambulatory Visit (INDEPENDENT_AMBULATORY_CARE_PROVIDER_SITE_OTHER): Payer: PPO | Admitting: *Deleted

## 2019-09-07 DIAGNOSIS — I480 Paroxysmal atrial fibrillation: Secondary | ICD-10-CM

## 2019-09-07 DIAGNOSIS — I442 Atrioventricular block, complete: Secondary | ICD-10-CM

## 2019-09-08 LAB — CUP PACEART REMOTE DEVICE CHECK
Battery Remaining Longevity: 45 mo
Battery Remaining Percentage: 46 %
Battery Voltage: 2.86 V
Brady Statistic AP VP Percent: 95 %
Brady Statistic AP VS Percent: 1 %
Brady Statistic AS VP Percent: 5.1 %
Brady Statistic AS VS Percent: 1 %
Brady Statistic RA Percent Paced: 95 %
Brady Statistic RV Percent Paced: 99 %
Date Time Interrogation Session: 20201118033207
Implantable Lead Implant Date: 19930618
Implantable Lead Implant Date: 19930618
Implantable Lead Location: 753859
Implantable Lead Location: 753860
Implantable Lead Serial Number: 23032978
Implantable Pulse Generator Implant Date: 20140516
Lead Channel Impedance Value: 360 Ohm
Lead Channel Impedance Value: 380 Ohm
Lead Channel Pacing Threshold Amplitude: 0.875 V
Lead Channel Pacing Threshold Amplitude: 1 V
Lead Channel Pacing Threshold Pulse Width: 0.5 ms
Lead Channel Pacing Threshold Pulse Width: 0.5 ms
Lead Channel Sensing Intrinsic Amplitude: 1 mV
Lead Channel Setting Pacing Amplitude: 1.125
Lead Channel Setting Pacing Amplitude: 2 V
Lead Channel Setting Pacing Pulse Width: 0.5 ms
Lead Channel Setting Sensing Sensitivity: 6 mV
Pulse Gen Model: 2210
Pulse Gen Serial Number: 7474476

## 2019-09-16 DIAGNOSIS — E1169 Type 2 diabetes mellitus with other specified complication: Secondary | ICD-10-CM | POA: Diagnosis not present

## 2019-09-16 DIAGNOSIS — I10 Essential (primary) hypertension: Secondary | ICD-10-CM | POA: Diagnosis not present

## 2019-09-16 DIAGNOSIS — I499 Cardiac arrhythmia, unspecified: Secondary | ICD-10-CM | POA: Diagnosis not present

## 2019-09-16 DIAGNOSIS — H9190 Unspecified hearing loss, unspecified ear: Secondary | ICD-10-CM | POA: Diagnosis not present

## 2019-09-16 DIAGNOSIS — Z125 Encounter for screening for malignant neoplasm of prostate: Secondary | ICD-10-CM | POA: Diagnosis not present

## 2019-09-16 DIAGNOSIS — E1122 Type 2 diabetes mellitus with diabetic chronic kidney disease: Secondary | ICD-10-CM | POA: Diagnosis not present

## 2019-09-16 DIAGNOSIS — N182 Chronic kidney disease, stage 2 (mild): Secondary | ICD-10-CM | POA: Diagnosis not present

## 2019-09-16 DIAGNOSIS — Z6824 Body mass index (BMI) 24.0-24.9, adult: Secondary | ICD-10-CM | POA: Diagnosis not present

## 2019-09-16 DIAGNOSIS — E559 Vitamin D deficiency, unspecified: Secondary | ICD-10-CM | POA: Diagnosis not present

## 2019-09-16 DIAGNOSIS — E785 Hyperlipidemia, unspecified: Secondary | ICD-10-CM | POA: Diagnosis not present

## 2019-09-16 DIAGNOSIS — N401 Enlarged prostate with lower urinary tract symptoms: Secondary | ICD-10-CM | POA: Diagnosis not present

## 2019-09-21 ENCOUNTER — Encounter (HOSPITAL_COMMUNITY): Payer: Self-pay

## 2019-09-21 ENCOUNTER — Emergency Department (HOSPITAL_COMMUNITY)
Admission: EM | Admit: 2019-09-21 | Discharge: 2019-09-21 | Disposition: A | Discharge status: Home / Self Care | Payer: No Typology Code available for payment source | Attending: Emergency Medicine | Admitting: Emergency Medicine

## 2019-09-21 ENCOUNTER — Other Ambulatory Visit: Payer: Self-pay

## 2019-09-21 DIAGNOSIS — Z85828 Personal history of other malignant neoplasm of skin: Secondary | ICD-10-CM | POA: Diagnosis not present

## 2019-09-21 DIAGNOSIS — I10 Essential (primary) hypertension: Secondary | ICD-10-CM | POA: Insufficient documentation

## 2019-09-21 DIAGNOSIS — I2510 Atherosclerotic heart disease of native coronary artery without angina pectoris: Secondary | ICD-10-CM | POA: Insufficient documentation

## 2019-09-21 DIAGNOSIS — Z950 Presence of cardiac pacemaker: Secondary | ICD-10-CM | POA: Diagnosis not present

## 2019-09-21 DIAGNOSIS — Z20828 Contact with and (suspected) exposure to other viral communicable diseases: Secondary | ICD-10-CM | POA: Diagnosis present

## 2019-09-21 DIAGNOSIS — Z7901 Long term (current) use of anticoagulants: Secondary | ICD-10-CM | POA: Insufficient documentation

## 2019-09-21 DIAGNOSIS — I4891 Unspecified atrial fibrillation: Secondary | ICD-10-CM | POA: Diagnosis not present

## 2019-09-21 DIAGNOSIS — U071 COVID-19: Secondary | ICD-10-CM | POA: Diagnosis not present

## 2019-09-21 LAB — POC SARS CORONAVIRUS 2 AG -  ED: SARS Coronavirus 2 Ag: POSITIVE — AB

## 2019-09-21 NOTE — ED Triage Notes (Signed)
Patient states his MD sent him for Covid testing since spouse positive. No complaints, no symptoms, no distress. HOH

## 2019-09-21 NOTE — Discharge Instructions (Signed)
You tested positive for COVID today. You will need to stay at home and self-isolate until the follow conditions are met:  a. at least 10 days from today (12/12) b. AND 3 days fever free without antipyretics (Tylenol or Ibuprofen) c. AND improvement in respiratory symptoms  Please rest and drink plenty of fluids Take Tylenol for pain or fever Please return for worsening symptoms

## 2019-09-21 NOTE — ED Provider Notes (Signed)
[facility] EMERGENCY DEPARTMENT Provider Note   CSN: [id number] Arrival date & time: 09/21/19  1022     History   Chief Complaint Chief Complaint  Patient presents with  . check for Covid    HPI Robert Cuevas is a 85 y.o. male past medical history of complete heart block with pacemaker, CAD, hyperglycemia, hypertension who presents for evaluation for concern of Covid exposure.  He reports that his wife tested positive for Covid 3 days ago but had been symptomatic for a few days before.  He states that he wanted to get tested because he is concerned for exposure.  He states he has not had any symptoms.  He does report that he is on oxygen whenever he leaves the house.  He denies any fevers, chest pain, difficulty breathing, abdominal pain, nausea/vomiting.    The history is provided by the patient.    Past Medical History:  Diagnosis Date  . Cancer ([place])    skin cancer removed from head  . Complete heart block ([place])    a. s/p gen change 07/2011. b. Pacer pocket infx 02/2013 - s/p extraction, temp perm placement, then eventual implantation of new St. Jude pacemaker 03/04/13.  . Coronary artery calcification    Seen on CT 02/2013.  . High cholesterol   . HOH (hard of hearing)   . Hyperglycemia   . Hypertension   . Pacemaker- St Jude  03/06/2013   Extracted 5/14 Reimplant 5/14   . Paroxysmal atrial fibrillation ([place]) 01/04/2015    Patient Active Problem List   Diagnosis Date Noted  . Long term (current) use of anticoagulants 02/23/2016  . Paroxysmal atrial fibrillation ([place]) 01/04/2015  . Dyspnea 05/03/2013  . Tachycardia 03/06/2013  . Hypoxia 03/06/2013  . Pacemaker- St Jude  03/06/2013  . Infection of pacemaker pocket ([place]) 02/22/2013  . Complete heart block ([place]) 09/19/2011    Past Surgical History:  Procedure Laterality Date  . GENERATOR REMOVAL N/A 03/02/2013   Procedure: GENERATOR REMOVAL;  Surgeon: Evans Lance, MD;  Location: [facility];  Service:  Cardiovascular;  Laterality: N/A;  . ICD LEAD REMOVAL N/A 03/02/2013   Procedure: ICD LEAD REMOVAL;  Surgeon: Evans Lance, MD;  Location: [facility];  Service: Cardiovascular;  Laterality: N/A;  . INGUINAL HERNIA REPAIR Bilateral   . PACEMAKER GENERATOR CHANGE N/A 09/19/2011   Procedure: PACEMAKER GENERATOR CHANGE;  Surgeon: Sanda Klein, MD;  Location: [facility] CATH LAB;  Service: Cardiovascular;  Laterality: N/A;  . PACEMAKER INSERTION    . PACEMAKER LEAD REMOVAL N/A 03/02/2013   Procedure: PACEMAKER LEAD REMOVAL;  Surgeon: Evans Lance, MD;  Location: [place];  Service: Cardiovascular;  Laterality: N/A;  . PERMANENT PACEMAKER INSERTION N/A 03/04/2013   Procedure: PERMANENT PACEMAKER INSERTION;  Surgeon: Evans Lance, MD;  Location: [facility] CATH LAB;  Service: Cardiovascular;  Laterality: N/A;        Home Medications    Prior to Admission medications   Medication Sig Start Date End Date Taking? Authorizing Provider  apixaban (ELIQUIS) 5 MG TABS tablet Take 1 tablet (5 mg total) by mouth 2 (two) times daily. 12/05/16   Croitoru, Mihai, MD  atorvastatin (LIPITOR) 20 MG tablet Take 1 tablet (20 mg total) by mouth daily. 03/03/18   Croitoru, Mihai, MD  metFORMIN (GLUCOPHAGE) 500 MG tablet Take by mouth 2 (two) times daily with a meal.    [provider]  metoprolol (LOPRESSOR) 50 MG tablet Take 1 tablet (50 mg total) by  mouth 2 (two) times daily. 03/07/13   Theora Gianotti, NP  Omega-3 Fatty Acids (FISH OIL) 1200 MG CAPS Take 2,400 mg by mouth daily.    [provider]  ramipril (ALTACE) 2.5 MG capsule Take 1 capsule by mouth daily. 02/27/18   [provider]    Family History No family history on file.  Social History Social History   Tobacco Use  . Smoking status: Never Smoker  . Smokeless tobacco: Never Used  Substance Use Topics  . Alcohol use: No  . Drug use: No     Allergies   Patient has no known allergies.   Review of Systems Review of  Systems  Constitutional: Negative for fever.  Respiratory: Negative for cough and shortness of breath.   Cardiovascular: Negative for chest pain.  Gastrointestinal: Negative for abdominal pain, nausea and vomiting.  Neurological: Negative for headaches.  All other systems reviewed and are negative.    Physical Exam Updated Vital Signs BP (!) 177/71 (BP Location: Right Arm)   Pulse 76   Temp 99 F (37.2 C) (Oral)   Resp (!) 22   SpO2 92%   Physical Exam Vitals signs and nursing note reviewed.  Constitutional:      Appearance: Normal appearance. He is well-developed.  HENT:     Head: Normocephalic and atraumatic.  Eyes:     General: Lids are normal.     Conjunctiva/sclera: Conjunctivae normal.     Pupils: Pupils are equal, round, and reactive to light.  Neck:     Musculoskeletal: Full passive range of motion without pain.  Cardiovascular:     Rate and Rhythm: Normal rate and regular rhythm.     Pulses: Normal pulses.     Heart sounds: Normal heart sounds. No murmur. No friction rub. No gallop.   Pulmonary:     Effort: Pulmonary effort is normal.     Breath sounds: Normal breath sounds.     Comments: Lungs clear to auscultation bilaterally.  Symmetric chest rise.  No wheezing, rales, rhonchi. Abdominal:     Palpations: Abdomen is soft. Abdomen is not rigid.     Tenderness: There is no abdominal tenderness. There is no guarding.  Musculoskeletal: Normal range of motion.  Skin:    General: Skin is warm and dry.     Capillary Refill: Capillary refill takes less than 2 seconds.  Neurological:     Mental Status: He is alert and oriented to person, place, and time.  Psychiatric:        Speech: Speech normal.      ED Treatments / Results  Labs (all labs ordered are listed, but only abnormal results are displayed) Labs Reviewed  POC SARS CORONAVIRUS 2 AG -  ED    EKG EKG Interpretation  Date/Time:  Wednesday September 21 2019 14:55:59 EST Ventricular Rate:  80 PR  Interval:    QRS Duration: 185 QT Interval:  462 QTC Calculation: 533 R Axis:   -77 Text Interpretation: VENTRICULAR PACED RHYTHM Artifact Abnormal ECG Confirmed by Carmin Muskrat [phone]) on 09/21/2019 3:15:34 PM   Radiology No results found.  Procedures Procedures (including critical care time)  Medications Ordered in ED Medications - No data to display   Initial Impression / Assessment and Plan / ED Course  I have reviewed the triage vital signs and the nursing notes.  Pertinent labs & imaging results that were available during my care of the patient were reviewed by me and considered in my medical decision making (  see chart for details).        85 year old male who presents for evaluation of wanting Covid test.  Reports that his wife recently tested positive for Covid.  He states that he came to get tested.  He has not had any symptoms.  Patient does report that he has oxygen when he goes out and travels.  He has no chest pain no difficulty breathing, fever, cough.  On initial ED arrival, he is afebrile, nontoxic-appearing.  Vital signs are stable.  He is on oxygen but states that this is his baseline.  We will plan for Covid testing.  Patient signed out to Janetta Hora with Covid test pending.  At this time, he is asymptomatic.  He is on oxygen at home and has no increase in his requirements.  Patient is stable for discharge at this time.  Portions of this note were generated with Lobbyist. Dictation errors may occur despite best attempts at proofreading.    Final Clinical Impressions(s) / ED Diagnoses   Final diagnoses:  None    ED Discharge Orders    None       Desma Mcgregor 09/21/19 1630    Carmin Muskrat, MD 09/23/19 [phone]

## 2019-09-21 NOTE — ED Provider Notes (Signed)
85 year old male presents for a COVID test. Rapid COVID is pending. At baseline he is on 4L of O2. He has not had any symptoms  Rapid COVID is positive. Re-evaluated pt. He feels fine. He was advised to quarantine for 10 days. Will d/c with return precautions.   Recardo Evangelist, PA-C 09/21/19 1753    Daleen Bo, MD 09/23/19 [phone]

## 2019-09-24 DIAGNOSIS — U071 COVID-19: Secondary | ICD-10-CM | POA: Diagnosis not present

## 2019-09-24 DIAGNOSIS — R0789 Other chest pain: Secondary | ICD-10-CM | POA: Diagnosis not present

## 2019-09-24 DIAGNOSIS — J9601 Acute respiratory failure with hypoxia: Secondary | ICD-10-CM | POA: Diagnosis not present

## 2019-09-24 DIAGNOSIS — I509 Heart failure, unspecified: Secondary | ICD-10-CM | POA: Diagnosis not present

## 2019-09-24 DIAGNOSIS — R918 Other nonspecific abnormal finding of lung field: Secondary | ICD-10-CM | POA: Diagnosis not present

## 2019-09-24 DIAGNOSIS — J1289 Other viral pneumonia: Secondary | ICD-10-CM | POA: Diagnosis not present

## 2019-09-24 DIAGNOSIS — I959 Hypotension, unspecified: Secondary | ICD-10-CM | POA: Diagnosis not present

## 2019-09-24 DIAGNOSIS — R079 Chest pain, unspecified: Secondary | ICD-10-CM | POA: Diagnosis not present

## 2019-09-24 DIAGNOSIS — E1165 Type 2 diabetes mellitus with hyperglycemia: Secondary | ICD-10-CM | POA: Diagnosis not present

## 2019-09-24 DIAGNOSIS — R0902 Hypoxemia: Secondary | ICD-10-CM | POA: Diagnosis not present

## 2019-09-25 ENCOUNTER — Encounter (HOSPITAL_COMMUNITY): Payer: Self-pay | Admitting: Family Medicine

## 2019-09-25 ENCOUNTER — Inpatient Hospital Stay (HOSPITAL_COMMUNITY)
Admission: AD | Admit: 2019-09-25 | Discharge: 2019-10-08 | DRG: 177 | Disposition: A | Discharge status: Skilled Nursing Facility | Payer: No Typology Code available for payment source | Source: Other Acute Inpatient Hospital | Attending: Internal Medicine | Admitting: Internal Medicine

## 2019-09-25 ENCOUNTER — Inpatient Hospital Stay (HOSPITAL_COMMUNITY): Payer: No Typology Code available for payment source

## 2019-09-25 DIAGNOSIS — I10 Essential (primary) hypertension: Secondary | ICD-10-CM | POA: Diagnosis not present

## 2019-09-25 DIAGNOSIS — I951 Orthostatic hypotension: Secondary | ICD-10-CM | POA: Diagnosis present

## 2019-09-25 DIAGNOSIS — Z7901 Long term (current) use of anticoagulants: Secondary | ICD-10-CM

## 2019-09-25 DIAGNOSIS — H9190 Unspecified hearing loss, unspecified ear: Secondary | ICD-10-CM | POA: Diagnosis present

## 2019-09-25 DIAGNOSIS — R279 Unspecified lack of coordination: Secondary | ICD-10-CM | POA: Diagnosis not present

## 2019-09-25 DIAGNOSIS — J9621 Acute and chronic respiratory failure with hypoxia: Secondary | ICD-10-CM | POA: Diagnosis not present

## 2019-09-25 DIAGNOSIS — J1282 Pneumonia due to coronavirus disease 2019: Secondary | ICD-10-CM | POA: Diagnosis present

## 2019-09-25 DIAGNOSIS — E119 Type 2 diabetes mellitus without complications: Secondary | ICD-10-CM | POA: Diagnosis not present

## 2019-09-25 DIAGNOSIS — I4892 Unspecified atrial flutter: Secondary | ICD-10-CM | POA: Diagnosis present

## 2019-09-25 DIAGNOSIS — U071 COVID-19: Secondary | ICD-10-CM | POA: Diagnosis not present

## 2019-09-25 DIAGNOSIS — Z743 Need for continuous supervision: Secondary | ICD-10-CM | POA: Diagnosis not present

## 2019-09-25 DIAGNOSIS — Z9981 Dependence on supplemental oxygen: Secondary | ICD-10-CM

## 2019-09-25 DIAGNOSIS — J1289 Other viral pneumonia: Secondary | ICD-10-CM | POA: Diagnosis not present

## 2019-09-25 DIAGNOSIS — R7989 Other specified abnormal findings of blood chemistry: Secondary | ICD-10-CM | POA: Diagnosis not present

## 2019-09-25 DIAGNOSIS — K760 Fatty (change of) liver, not elsewhere classified: Secondary | ICD-10-CM | POA: Diagnosis not present

## 2019-09-25 DIAGNOSIS — I480 Paroxysmal atrial fibrillation: Secondary | ICD-10-CM | POA: Diagnosis present

## 2019-09-25 DIAGNOSIS — L8990 Pressure ulcer of unspecified site, unspecified stage: Secondary | ICD-10-CM | POA: Insufficient documentation

## 2019-09-25 DIAGNOSIS — E1165 Type 2 diabetes mellitus with hyperglycemia: Secondary | ICD-10-CM | POA: Diagnosis not present

## 2019-09-25 DIAGNOSIS — E1169 Type 2 diabetes mellitus with other specified complication: Secondary | ICD-10-CM | POA: Diagnosis present

## 2019-09-25 DIAGNOSIS — R4182 Altered mental status, unspecified: Secondary | ICD-10-CM | POA: Diagnosis not present

## 2019-09-25 DIAGNOSIS — I9589 Other hypotension: Secondary | ICD-10-CM | POA: Diagnosis not present

## 2019-09-25 DIAGNOSIS — Z23 Encounter for immunization: Secondary | ICD-10-CM | POA: Diagnosis not present

## 2019-09-25 DIAGNOSIS — Z7984 Long term (current) use of oral hypoglycemic drugs: Secondary | ICD-10-CM | POA: Diagnosis not present

## 2019-09-25 DIAGNOSIS — M6281 Muscle weakness (generalized): Secondary | ICD-10-CM | POA: Diagnosis not present

## 2019-09-25 DIAGNOSIS — R2689 Other abnormalities of gait and mobility: Secondary | ICD-10-CM | POA: Diagnosis not present

## 2019-09-25 DIAGNOSIS — Z85828 Personal history of other malignant neoplasm of skin: Secondary | ICD-10-CM

## 2019-09-25 DIAGNOSIS — Z79899 Other long term (current) drug therapy: Secondary | ICD-10-CM

## 2019-09-25 DIAGNOSIS — E7800 Pure hypercholesterolemia, unspecified: Secondary | ICD-10-CM | POA: Diagnosis not present

## 2019-09-25 DIAGNOSIS — R918 Other nonspecific abnormal finding of lung field: Secondary | ICD-10-CM | POA: Diagnosis not present

## 2019-09-25 DIAGNOSIS — R2681 Unsteadiness on feet: Secondary | ICD-10-CM | POA: Diagnosis not present

## 2019-09-25 DIAGNOSIS — L89152 Pressure ulcer of sacral region, stage 2: Secondary | ICD-10-CM | POA: Diagnosis not present

## 2019-09-25 DIAGNOSIS — R0902 Hypoxemia: Secondary | ICD-10-CM

## 2019-09-25 DIAGNOSIS — I2510 Atherosclerotic heart disease of native coronary artery without angina pectoris: Secondary | ICD-10-CM | POA: Diagnosis present

## 2019-09-25 DIAGNOSIS — Z66 Do not resuscitate: Secondary | ICD-10-CM | POA: Diagnosis not present

## 2019-09-25 DIAGNOSIS — I959 Hypotension, unspecified: Secondary | ICD-10-CM | POA: Diagnosis not present

## 2019-09-25 DIAGNOSIS — R0602 Shortness of breath: Secondary | ICD-10-CM

## 2019-09-25 DIAGNOSIS — E785 Hyperlipidemia, unspecified: Secondary | ICD-10-CM | POA: Diagnosis present

## 2019-09-25 DIAGNOSIS — I442 Atrioventricular block, complete: Secondary | ICD-10-CM | POA: Diagnosis not present

## 2019-09-25 DIAGNOSIS — Z950 Presence of cardiac pacemaker: Secondary | ICD-10-CM

## 2019-09-25 DIAGNOSIS — R52 Pain, unspecified: Secondary | ICD-10-CM | POA: Diagnosis not present

## 2019-09-25 DIAGNOSIS — J9600 Acute respiratory failure, unspecified whether with hypoxia or hypercapnia: Secondary | ICD-10-CM | POA: Diagnosis present

## 2019-09-25 DIAGNOSIS — R278 Other lack of coordination: Secondary | ICD-10-CM | POA: Diagnosis not present

## 2019-09-25 DIAGNOSIS — R1312 Dysphagia, oropharyngeal phase: Secondary | ICD-10-CM | POA: Diagnosis not present

## 2019-09-25 DIAGNOSIS — E118 Type 2 diabetes mellitus with unspecified complications: Secondary | ICD-10-CM | POA: Diagnosis present

## 2019-09-25 DIAGNOSIS — R41841 Cognitive communication deficit: Secondary | ICD-10-CM | POA: Diagnosis not present

## 2019-09-25 LAB — HEMOGLOBIN A1C
Hgb A1c MFr Bld: 8.4 % — ABNORMAL HIGH (ref 4.8–5.6)
Mean Plasma Glucose: 194.38 mg/dL

## 2019-09-25 LAB — COMPREHENSIVE METABOLIC PANEL
ALT: 22 U/L (ref 0–44)
AST: 32 U/L (ref 15–41)
Albumin: 2.8 g/dL — ABNORMAL LOW (ref 3.5–5.0)
Alkaline Phosphatase: 154 U/L — ABNORMAL HIGH (ref 38–126)
Anion gap: 15 (ref 5–15)
BUN: 32 mg/dL — ABNORMAL HIGH (ref 8–23)
CO2: 30 mmol/L (ref 22–32)
Calcium: 8.7 mg/dL — ABNORMAL LOW (ref 8.9–10.3)
Chloride: 92 mmol/L — ABNORMAL LOW (ref 98–111)
Creatinine, Ser: 0.87 mg/dL (ref 0.61–1.24)
GFR calc Af Amer: >60 mL/min (ref >60)
GFR calc non Af Amer: >60 mL/min (ref >60)
Glucose, Bld: 223 mg/dL — ABNORMAL HIGH (ref 70–99)
Potassium: 4.3 mmol/L (ref 3.5–5.1)
Sodium: 137 mmol/L (ref 135–145)
Total Bilirubin: 2.3 mg/dL — ABNORMAL HIGH (ref 0.3–1.2)
Total Protein: 6.7 g/dL (ref 6.5–8.1)

## 2019-09-25 LAB — GLUCOSE, CAPILLARY
Glucose-Capillary: 205 mg/dL — ABNORMAL HIGH (ref 70–99)
Glucose-Capillary: 228 mg/dL — ABNORMAL HIGH (ref 70–99)
Glucose-Capillary: 321 mg/dL — ABNORMAL HIGH (ref 70–99)

## 2019-09-25 LAB — CBC WITH DIFFERENTIAL/PLATELET
Abs Immature Granulocytes: 0.13 10*3/uL — ABNORMAL HIGH (ref 0.00–0.07)
Basophils Absolute: 0.1 10*3/uL (ref 0.0–0.1)
Basophils Relative: 0 %
Eosinophils Absolute: 0 10*3/uL (ref 0.0–0.5)
Eosinophils Relative: 0 %
HCT: 48.3 % (ref 39.0–52.0)
Hemoglobin: 16.3 g/dL (ref 13.0–17.0)
Immature Granulocytes: 1 %
Lymphocytes Relative: 3 %
Lymphs Abs: 0.4 10*3/uL — ABNORMAL LOW (ref 0.7–4.0)
MCH: 31.9 pg (ref 26.0–34.0)
MCHC: 33.7 g/dL (ref 30.0–36.0)
MCV: 94.5 fL (ref 80.0–100.0)
Monocytes Absolute: 0.8 10*3/uL (ref 0.1–1.0)
Monocytes Relative: 6 %
Neutro Abs: 11.2 10*3/uL — ABNORMAL HIGH (ref 1.7–7.7)
Neutrophils Relative %: 90 %
Platelets: 221 10*3/uL (ref 150–400)
RBC: 5.11 MIL/uL (ref 4.22–5.81)
RDW: 14.1 % (ref 11.5–15.5)
WBC: 12.5 10*3/uL — ABNORMAL HIGH (ref 4.0–10.5)
nRBC: 0.3 % — ABNORMAL HIGH (ref 0.0–0.2)

## 2019-09-25 LAB — PROCALCITONIN: Procalcitonin: 0.31 ng/mL

## 2019-09-25 LAB — C-REACTIVE PROTEIN: CRP: 13.3 mg/dL — ABNORMAL HIGH (ref <1.0)

## 2019-09-25 LAB — D-DIMER, QUANTITATIVE: D-Dimer, Quant: 2.09 ug/mL-FEU — ABNORMAL HIGH (ref 0.00–0.50)

## 2019-09-25 LAB — STREP PNEUMONIAE URINARY ANTIGEN: Strep Pneumo Urinary Antigen: NEGATIVE

## 2019-09-25 LAB — MRSA PCR SCREENING: MRSA by PCR: NEGATIVE

## 2019-09-25 LAB — ABO/RH: ABO/RH(D): O POS

## 2019-09-25 LAB — MAGNESIUM: Magnesium: 1.8 mg/dL (ref 1.7–2.4)

## 2019-09-25 LAB — PHOSPHORUS: Phosphorus: 4.8 mg/dL — ABNORMAL HIGH (ref 2.5–4.6)

## 2019-09-25 MED ORDER — SODIUM CHLORIDE 0.9% FLUSH
3.0000 mL | Freq: Two times a day (BID) | INTRAVENOUS | Status: DC
  Administered 2019-09-25 – 2019-10-06 (×23): 3 mL via INTRAVENOUS

## 2019-09-25 MED ORDER — METOPROLOL TARTRATE 25 MG PO TABS
50.0000 mg | ORAL_TABLET | Freq: Two times a day (BID) | ORAL | Status: DC
  Administered 2019-09-25 – 2019-10-02 (×12): 50 mg via ORAL
  Filled 2019-09-25 (×13): qty 2

## 2019-09-25 MED ORDER — SODIUM CHLORIDE 0.9 % IV SOLN: 100.0000 mg | Freq: Every day | INTRAVENOUS | Status: DC

## 2019-09-25 MED ORDER — SODIUM CHLORIDE 0.9% FLUSH: 3.0000 mL | INTRAVENOUS | Status: DC | PRN

## 2019-09-25 MED ORDER — ONDANSETRON HCL 4 MG/2ML IJ SOLN: 4.0000 mg | Freq: Four times a day (QID) | INTRAMUSCULAR | Status: DC | PRN

## 2019-09-25 MED ORDER — SODIUM CHLORIDE 0.9 % IV SOLN
500.0000 mg | INTRAVENOUS | Status: AC
  Administered 2019-09-25 – 2019-09-28 (×4): 500 mg via INTRAVENOUS
  Filled 2019-09-25 (×4): qty 500

## 2019-09-25 MED ORDER — FAMOTIDINE 20 MG PO TABS
20.0000 mg | ORAL_TABLET | Freq: Two times a day (BID) | ORAL | Status: DC
  Administered 2019-09-25 – 2019-10-08 (×26): 20 mg via ORAL
  Filled 2019-09-25 (×26): qty 1

## 2019-09-25 MED ORDER — APIXABAN 5 MG PO TABS
5.0000 mg | ORAL_TABLET | Freq: Two times a day (BID) | ORAL | Status: DC
  Administered 2019-09-25 – 2019-10-08 (×27): 5 mg via ORAL
  Filled 2019-09-25 (×27): qty 1

## 2019-09-25 MED ORDER — ONDANSETRON HCL 4 MG PO TABS: 4.0000 mg | ORAL_TABLET | Freq: Four times a day (QID) | ORAL | Status: DC | PRN

## 2019-09-25 MED ORDER — INSULIN ASPART 100 UNIT/ML ~~LOC~~ SOLN
0.0000 [IU] | Freq: Three times a day (TID) | SUBCUTANEOUS | Status: DC
  Administered 2019-09-25 (×2): 3 [IU] via SUBCUTANEOUS
  Administered 2019-09-25: 2 [IU] via SUBCUTANEOUS
  Administered 2019-09-26: 5 [IU] via SUBCUTANEOUS
  Administered 2019-09-26: 3 [IU] via SUBCUTANEOUS
  Administered 2019-09-26: 9 [IU] via SUBCUTANEOUS
  Administered 2019-09-27: 1 [IU] via SUBCUTANEOUS
  Administered 2019-09-27: 3 [IU] via SUBCUTANEOUS
  Administered 2019-09-27 – 2019-09-28 (×2): 5 [IU] via SUBCUTANEOUS
  Administered 2019-09-28 – 2019-09-29 (×3): 2 [IU] via SUBCUTANEOUS
  Administered 2019-09-29 – 2019-09-30 (×4): 3 [IU] via SUBCUTANEOUS
  Administered 2019-10-01: 2 [IU] via SUBCUTANEOUS
  Administered 2019-10-01 (×2): 7 [IU] via SUBCUTANEOUS
  Administered 2019-10-02: 5 [IU] via SUBCUTANEOUS
  Administered 2019-10-02: 3 [IU] via SUBCUTANEOUS
  Administered 2019-10-03: 7 [IU] via SUBCUTANEOUS
  Administered 2019-10-03: 1 [IU] via SUBCUTANEOUS
  Administered 2019-10-04: 7 [IU] via SUBCUTANEOUS
  Administered 2019-10-04: 1 [IU] via SUBCUTANEOUS
  Administered 2019-10-04 – 2019-10-05 (×2): 3 [IU] via SUBCUTANEOUS
  Administered 2019-10-05: 2 [IU] via SUBCUTANEOUS
  Administered 2019-10-06: 3 [IU] via SUBCUTANEOUS
  Administered 2019-10-06: 1 [IU] via SUBCUTANEOUS
  Administered 2019-10-06: 2 [IU] via SUBCUTANEOUS
  Administered 2019-10-07: 1 [IU] via SUBCUTANEOUS
  Administered 2019-10-07: 2 [IU] via SUBCUTANEOUS
  Administered 2019-10-08: 5 [IU] via SUBCUTANEOUS

## 2019-09-25 MED ORDER — RAMIPRIL 2.5 MG PO CAPS
2.5000 mg | ORAL_CAPSULE | Freq: Every day | ORAL | Status: DC
  Administered 2019-09-25 – 2019-10-08 (×13): 2.5 mg via ORAL
  Filled 2019-09-25 (×15): qty 1

## 2019-09-25 MED ORDER — SODIUM CHLORIDE 0.9 % IV SOLN
100.0000 mg | Freq: Every day | INTRAVENOUS | Status: AC
  Administered 2019-09-25 – 2019-09-28 (×4): 100 mg via INTRAVENOUS
  Filled 2019-09-25 (×4): qty 20

## 2019-09-25 MED ORDER — SODIUM CHLORIDE 0.9% FLUSH
3.0000 mL | Freq: Two times a day (BID) | INTRAVENOUS | Status: DC
  Administered 2019-09-25 – 2019-10-06 (×19): 3 mL via INTRAVENOUS

## 2019-09-25 MED ORDER — SODIUM CHLORIDE 0.9 % IV SOLN: 200.0000 mg | Freq: Once | INTRAVENOUS | Status: DC

## 2019-09-25 MED ORDER — SODIUM CHLORIDE 0.9% IV SOLUTION
Freq: Once | INTRAVENOUS | Status: AC
  Administered 2019-09-25: 11:00:00 via INTRAVENOUS

## 2019-09-25 MED ORDER — SODIUM CHLORIDE 0.9 % IV SOLN
250.0000 mL | INTRAVENOUS | Status: DC | PRN
  Administered 2019-09-25: 250 mL via INTRAVENOUS

## 2019-09-25 MED ORDER — ACETAMINOPHEN 325 MG PO TABS
650.0000 mg | ORAL_TABLET | Freq: Four times a day (QID) | ORAL | Status: DC | PRN
  Administered 2019-10-02 – 2019-10-03 (×2): 650 mg via ORAL
  Filled 2019-09-25 (×2): qty 2

## 2019-09-25 MED ORDER — TOCILIZUMAB 400 MG/20ML IV SOLN
8.0000 mg/kg | Freq: Once | INTRAVENOUS | Status: AC
  Administered 2019-09-25: 486 mg via INTRAVENOUS
  Filled 2019-09-25: qty 24.3

## 2019-09-25 MED ORDER — DEXAMETHASONE SODIUM PHOSPHATE 10 MG/ML IJ SOLN
6.0000 mg | INTRAMUSCULAR | Status: AC
  Administered 2019-09-25 – 2019-10-04 (×10): 6 mg via INTRAVENOUS
  Filled 2019-09-25 (×10): qty 1

## 2019-09-25 MED ORDER — GUAIFENESIN-DM 100-10 MG/5ML PO SYRP
10.0000 mL | ORAL_SOLUTION | ORAL | Status: DC | PRN
  Administered 2019-09-27 – 2019-09-28 (×2): 10 mL via ORAL
  Filled 2019-09-25 (×3): qty 10

## 2019-09-25 MED ORDER — CHLORHEXIDINE GLUCONATE CLOTH 2 % EX PADS
6.0000 | MEDICATED_PAD | Freq: Every day | CUTANEOUS | Status: DC
  Administered 2019-09-25 – 2019-10-08 (×14): 6 via TOPICAL

## 2019-09-25 MED ORDER — SODIUM CHLORIDE 0.9 % IV SOLN
2.0000 g | INTRAVENOUS | Status: AC
  Administered 2019-09-25 – 2019-09-28 (×4): 2 g via INTRAVENOUS
  Filled 2019-09-25 (×4): qty 20

## 2019-09-25 MED ORDER — ORAL CARE MOUTH RINSE
15.0000 mL | Freq: Two times a day (BID) | OROMUCOSAL | Status: DC
  Administered 2019-09-25 – 2019-10-08 (×27): 15 mL via OROMUCOSAL

## 2019-09-25 MED ORDER — ATORVASTATIN CALCIUM 10 MG PO TABS
20.0000 mg | ORAL_TABLET | Freq: Every day | ORAL | Status: DC
  Administered 2019-09-25 – 2019-10-07 (×13): 20 mg via ORAL
  Filled 2019-09-25 (×13): qty 2

## 2019-09-25 NOTE — Progress Notes (Addendum)
Pharmacy Note - Remdesivir Dosing  O:  ALT: 27 CXR: multi-focal PNA Requiring supplemental O2:  90% on 20L per HFNC  A/P:  Patient meets criteria for remdesivir.  Patient received  remdesivir 200 mg IV x 1 dose at 1100 on 09-24-19 at [facility] 100 mg IV daily x 4 days on 09/25/19 at 1000 Monitor ALT, clinical progress  Despina Pole, Pharm. D. Clinical Pharmacist 09/25/2019 4:09 AM

## 2019-09-25 NOTE — Progress Notes (Signed)
Updated patientswife on phone. Pts wife to bring pts dentures and glasses tomorrow. Robert Cuevas

## 2019-09-25 NOTE — Progress Notes (Signed)
PROGRESS NOTE                                                                                                                                                                                                             Patient Demographics:    Robert Cuevas, is a 85 y.o. male, DOB - 1930-09-04, [id number]  Admit date - 09/25/2019   Admitting Physician Vianne Bulls, MD  Outpatient [facility] the [facility]  LOS - 0   No chief complaint on file.      Brief Narrative   This is a no charge note as patient seen and admitted earlier today by Dr. Myna Hidalgo, chart, imaging, were reviewed, patient was seen and examined.   Subjective:    Robert Cuevas today No headache, No chest pain, No abdominal pain , does report some cough, and dyspnea.   Assessment  & Plan :    Principal Problem:   Acute on chronic respiratory failure with hypoxia (HCC) Active Problems:   Complete heart block (HCC)   Paroxysmal atrial fibrillation ([place])   COVID-19 virus infection   Diabetes mellitus type II, non insulin dependent (HCC)   Acute respiratory failure due to COVID-19 (HCC)   Hyperbilirubinemia   Pressure injury of skin  COVID-19 pneumonia; acute on chronic hypoxic respiratory failure  - Presents with progressive SOB after testing positive for COVID-19 in [facility] ED on 12/2, found to have CXR findings concerning for multifocal PNA, increased supplemental O2 requirement, and procalcitonin 0.41  - On arrival to [facility], he is only slightly tachypneic, saturating 88-93% on 15 Lpm via NRB, and speaking full sentences  -This morning patient requiring heated high flow nasal cannula 30 L, he is at extremely high risk for decompensation, discussed convulsant plasma with patient and wife via phone, they are both agreeable, will go ahead and proceed with convulsant plasma. -As well discussed Actemra option, explained risks and benefits, patient with no  history of TB, hepatitis viral infection, bowel perforation, diverticulitis, no active malignancy or current immunotherapy, agreeable to proceed with Actemra, will give Actemra today,The treatment plan and use of medications and known side effects were discussed with patient/family, they were clearly explained that there is no proven definitive treatment for COVID-19 infection, any medications used here are based on published clinical articles/anecdotal data which are not  peer-reviewed or randomized control trials.  Complete risks and long-term side effects are unknown, however in the best clinical judgment they seem to be of some clinical benefit rather than medical risks.  Patient/family agree with the treatment plan and want to receive the given medications. - Check sputum culture, strep pneumo and legionella antigens, and trend inflammatory markers  - Continue Rocephin, azithromycin, Decadron, and remdesivir    COVID-19 Labs  Recent Labs    09/25/19 0452  DDIMER 2.09*  CRP 13.3*   Paroxysmal atrial fibrillation  - In rate-controlled atrial flutter in ED  - CHADS-VASc is at least 30 (age x2, HTN, DM) - Continue Eliquis, continue metoprolol as tolerated    Type II DM  - No recent A1c available  - Managed with metformin only at home, held on admission  - Start low-intensity SSI with Novolog and adjust as needed    Hypertension  - BP at goal, continue metoprolol and ramipril as tolerated    Hyperbilirubinemia  - Total bilirubin is 3.2 in ED with normal transaminases and benign abdominal exam  - Fractionate bilirubin, check INR     Code Status : DNR  Family Communication  : D/W wife  Disposition Plan  : remains in ICU  Consults  :  None  Procedures  : None  DVT Prophylaxis  :  On Eliquis  Lab Results  Component Value Date   PLT 221 09/25/2019    Antibiotics  :    Anti-infectives (From admission, onward)   Start     Dose/Rate Route Frequency Ordered Stop   09/26/19  1000  remdesivir 100 mg in sodium chloride 0.9 % 100 mL IVPB  Status:  Discontinued     100 mg 200 mL/hr over 30 Minutes Intravenous Daily 09/25/19 0353 09/25/19 0406   09/25/19 1100  azithromycin (ZITHROMAX) 500 mg in sodium chloride 0.9 % 250 mL IVPB     500 mg 250 mL/hr over 60 Minutes Intravenous Every 24 hours 09/25/19 0438 09/29/19 1059   09/25/19 1000  remdesivir 100 mg in sodium chloride 0.9 % 100 mL IVPB     100 mg 200 mL/hr over 30 Minutes Intravenous Daily 09/25/19 0407 09/29/19 0959   09/25/19 1000  cefTRIAXone (ROCEPHIN) 2 g in sodium chloride 0.9 % 100 mL IVPB     2 g 200 mL/hr over 30 Minutes Intravenous Every 24 hours 09/25/19 0438 09/29/19 0959   09/25/19 0400  remdesivir 200 mg in sodium chloride 0.9% 250 mL IVPB  Status:  Discontinued     200 mg 580 mL/hr over 30 Minutes Intravenous Once 09/25/19 0353 09/25/19 0406        Objective:   Vitals:   09/25/19 1300 09/25/19 1400 09/25/19 1546 09/25/19 1600  BP: 117/60 (!) 94/53 (!) 120/57 118/62  Pulse:   70 70  Resp: 20 (!) 22 (!) 21 (!) 23  Temp:    97.7 F (36.5 C)  TempSrc:    Oral  SpO2: 92% 92% 92% 91%  Weight:      Height:        Wt Readings from Last 3 Encounters:  09/25/19 60.8 kg  03/03/18 63.9 kg  02/27/17 66.2 kg     Intake/Output Summary (Last 24 hours) at 09/25/2019 1723 Last data filed at 09/25/2019 1600 Gross per 24 hour  Intake 560.07 ml  Output 350 ml  Net 210.07 ml     Physical Exam  Awake Alert, Oriented X 3, No new F.N deficits, Normal affect Symmetrical  Chest wall movement, Good air movement bilaterally, CTAB RRR,No Gallops,Rubs or new Murmurs, No Parasternal Heave +ve B.Sounds, Abd Soft, No tenderness,No rebound - guarding or rigidity. No Cyanosis, Clubbing or edema, No new Rash or bruise      Data Review:    CBC Recent Labs  Lab 09/25/19 0452  WBC 12.5*  HGB 16.3  HCT 48.3  PLT 221  MCV 94.5  MCH 31.9  MCHC 33.7  RDW 14.1  LYMPHSABS 0.4*  MONOABS 0.8  EOSABS  0.0  BASOSABS 0.1    Chemistries  Recent Labs  Lab 09/25/19 0452  NA 137  K 4.3  CL 92*  CO2 30  GLUCOSE 223*  BUN 32*  CREATININE 0.87  CALCIUM 8.7*  MG 1.8  AST 32  ALT 22  ALKPHOS 154*  BILITOT 2.3*   ------------------------------------------------------------------------------------------------------------------ No results for input(s): CHOL, HDL, LDLCALC, TRIG, CHOLHDL, LDLDIRECT in the last 72 hours.  Lab Results  Component Value Date   HGBA1C 8.4 (H) 09/25/2019   ------------------------------------------------------------------------------------------------------------------ No results for input(s): TSH, T4TOTAL, T3FREE, THYROIDAB in the last 72 hours.  Invalid input(s): FREET3 ------------------------------------------------------------------------------------------------------------------ No results for input(s): VITAMINB12, FOLATE, FERRITIN, TIBC, IRON, RETICCTPCT in the last 72 hours.  Coagulation profile No results for input(s): INR, PROTIME in the last 168 hours.  Recent Labs    09/25/19 0452  DDIMER 2.09*    Cardiac Enzymes No results for input(s): CKMB, TROPONINI, MYOGLOBIN in the last 168 hours.  Invalid input(s): CK ------------------------------------------------------------------------------------------------------------------ No results found for: BNP  Inpatient Medications  Scheduled Meds: . apixaban  5 mg Oral BID  . atorvastatin  20 mg Oral q1800  . Chlorhexidine Gluconate Cloth  6 each Topical Daily  . dexamethasone (DECADRON) injection  6 mg Intravenous Q24H  . insulin aspart  0-9 Units Subcutaneous TID WC  . mouth rinse  15 mL Mouth Rinse BID  . metoprolol tartrate  50 mg Oral BID  . ramipril  2.5 mg Oral Daily  . sodium chloride flush  3 mL Intravenous Q12H  . sodium chloride flush  3 mL Intravenous Q12H   Continuous Infusions: . sodium chloride 10 mL/hr at 09/25/19 1600  . azithromycin Stopped (09/25/19 1220)  .  cefTRIAXone (ROCEPHIN)  IV Stopped (09/25/19 1006)  . remdesivir 100 mg in NS 100 mL Stopped (09/25/19 1011)   PRN Meds:.sodium chloride, acetaminophen, guaiFENesin-dextromethorphan, ondansetron **OR** ondansetron (ZOFRAN) IV, sodium chloride flush  Micro Results Recent Results (from the past 240 hour(s))  MRSA PCR Screening     Status: None   Collection Time: 09/25/19  4:33 AM   Specimen: Nasal Mucosa; Nasopharyngeal  Result Value Ref Range Status   MRSA by PCR NEGATIVE NEGATIVE Final    Comment:        The GeneXpert MRSA Assay (FDA approved for NASAL specimens only), is one component of a comprehensive MRSA colonization surveillance program. It is not intended to diagnose MRSA infection nor to guide or monitor treatment for MRSA infections. Performed at [facility], [place] [address] [place], [place] [address]     Radiology Reports Dg Chest Port 1 View  Result Date: 09/25/2019 CLINICAL DATA:  COVID-19 EXAM: PORTABLE CHEST 1 VIEW COMPARISON:  Portable exam [id number] hours compared to 09/24/2019 FINDINGS: RIGHT subclavian transvenous pacemaker leads project over RIGHT atrium and RIGHT ventricle. Enlargement of cardiac silhouette. Atherosclerotic calcification aorta. Mediastinal contours and pulmonary vascularity normal. Patchy airspace infiltrates bilaterally consistent with multifocal pneumonia and history of COVID-19, greater on RIGHT. Skin fold projects over  LEFT chest. No pleural effusion or pneumothorax. Diffuse osseous demineralization with BILATERAL glenohumeral degenerative changes and probable BILATERAL chronic rotator cuff tears. IMPRESSION: Patchy BILATERAL pulmonary infiltrates RIGHT greater than LEFT consistent with multifocal pneumonia and history of COVID-19, little changed. Aortic Atherosclerosis (ICD10-I70.0). Electronically Signed   By: Lavonia Dana M.D.   On: 09/25/2019 15:28     Phillips Climes M.D on 09/25/2019 at 5:23 PM  Between 7am to 7pm - Pager  - [phone]  After 7pm go to [email or web address] - password [facility]  [facility] -  Office  [phone]

## 2019-09-25 NOTE — H&P (Signed)
History and Physical    Robert Cuevas DOB: 06/09/1930 DOA: 09/25/2019  [facility]     Patient coming from: Home   Chief Complaint: SOB   HPI: Robert Cuevas is a 85 y.o. male with medical history significant for complete heart block with pacer, hypertension, paroxysmal atrial fibrillation on Eliquis, chronic hypoxic respiratory failure, and non-insulin-dependent diabetes mellitus, diagnosed with COVID-19 on 09/21/2019 and now presenting with progressive shortness of breath.  Patient was seen in the emergency department on 09/21/2019 to request COVID-19 testing because his wife was positive for the virus though the patient denies any symptoms at that time.  He since developed progressive shortness of breath and went to [facility] ED for evaluation.  He reported a chronic 4 L/min supplemental oxygen requirement when he is active.  He denies any chest pain, denies abdominal pain, denies vomiting or diarrhea, and denies any lower extremity swelling or tenderness. Patient reports that he still takes Eliquis and denies any recent changes to his medications.   [facility] ED Course: Upon arrival to the ED, patient is found to be hypoxic and requiring 100% FiO2 initially, slightly tachycardic, and with stable blood pressure.  Chest x-ray features bilateral opacities concerning for multifocal pneumonia.  EKG with demand pacing, atrial flutter, and nonspecific IVCD.  Chemistry panel was notable for hyperglycemia and total bilirubin of 3.2 with normal transaminases.  Procalcitonin was elevated to 0.41.  Troponin was normal and BNP elevated.  Patient's family reported that he would not want to be intubated and he was placed on BiPAP.  He was started on Rocephin and azithromycin in the ED.  He was also given Lasix in the ED. Patient was accepted for admission to [facility] and arrives with mild tachypnea, O2 saturation in the upper 80s/low 90s on NRB with 15 L/min.  Review of Systems:   All other systems reviewed and apart from HPI, are negative.  Past Medical History:  Diagnosis Date  . Cancer ([place])    skin cancer removed from head  . Complete heart block ([place])    a. s/p gen change 07/2011. b. Pacer pocket infx 02/2013 - s/p extraction, temp perm placement, then eventual implantation of new [employer] Jude pacemaker 03/04/13.  . Coronary artery calcification    Seen on CT 02/2013.  . High cholesterol   . HOH (hard of hearing)   . Hyperglycemia   . Hypertension   . Pacemaker- St Jude  03/06/2013   Extracted 5/14 Reimplant 5/14   . Paroxysmal atrial fibrillation ([place]) 01/04/2015    Past Surgical History:  Procedure Laterality Date  . GENERATOR REMOVAL N/A 03/02/2013   Procedure: GENERATOR REMOVAL;  Surgeon: Evans Lance, MD;  Location: [place];  Service: Cardiovascular;  Laterality: N/A;  . ICD LEAD REMOVAL N/A 03/02/2013   Procedure: ICD LEAD REMOVAL;  Surgeon: Evans Lance, MD;  Location: [place];  Service: Cardiovascular;  Laterality: N/A;  . INGUINAL HERNIA REPAIR Bilateral   . PACEMAKER GENERATOR CHANGE N/A 09/19/2011   Procedure: PACEMAKER GENERATOR CHANGE;  Surgeon: Sanda Klein, MD;  Location: [place] CATH LAB;  Service: Cardiovascular;  Laterality: N/A;  . PACEMAKER INSERTION    . PACEMAKER LEAD REMOVAL N/A 03/02/2013   Procedure: PACEMAKER LEAD REMOVAL;  Surgeon: Evans Lance, MD;  Location: [place];  Service: Cardiovascular;  Laterality: N/A;  . PERMANENT PACEMAKER INSERTION N/A 03/04/2013   Procedure: PERMANENT PACEMAKER INSERTION;  Surgeon: Evans Lance, MD;  Location: [facility] CATH LAB;  Service: Cardiovascular;  Laterality: N/A;     reports that he has never smoked. He has never used smokeless tobacco. He reports that he does not drink alcohol or use drugs.  No Known Allergies  History reviewed. No pertinent family history.   Prior to Admission medications   Medication Sig Start Date End Date Taking? Authorizing Provider  apixaban (ELIQUIS) 5 MG TABS tablet Take  1 tablet (5 mg total) by mouth 2 (two) times daily. 12/05/16   Croitoru, Mihai, MD  atorvastatin (LIPITOR) 20 MG tablet Take 1 tablet (20 mg total) by mouth daily. 03/03/18   Croitoru, Mihai, MD  metFORMIN (GLUCOPHAGE) 500 MG tablet Take by mouth 2 (two) times daily with a meal.    [provider]  metoprolol (LOPRESSOR) 50 MG tablet Take 1 tablet (50 mg total) by mouth 2 (two) times daily. 03/07/13   Theora Gianotti, NP  Omega-3 Fatty Acids (FISH OIL) 1200 MG CAPS Take 2,400 mg by mouth daily.    [provider]  ramipril (ALTACE) 2.5 MG capsule Take 1 capsule by mouth daily. 02/27/18   [provider]    Physical Exam: Vitals:   09/25/19 0348 09/25/19 0400  BP: (!) 123/55 (!) 125/54  Pulse: 70 70  Resp: (!) 23 20  Temp: 98.2 F (36.8 C)   TempSrc: Oral   SpO2: (!) 88% 93%    Constitutional: NAD, calm, appears frail Eyes: PERTLA, lids and conjunctivae normal ENMT: Mucous membranes are moist. Posterior pharynx clear of any exudate or lesions.   Neck: normal, supple, no masses, no thyromegaly Respiratory: mild tachypnea. No pallor or cyanosis.  Cardiovascular: S1 & S2 heard, regular rate and rhythm. No extremity edema.   Abdomen: No distension, soft, non-tender. Bowel sounds active.  Musculoskeletal: no clubbing / cyanosis. No joint deformity upper and lower extremities.   Skin: no significant rashes, lesions, ulcers. Warm, dry, well-perfused. Neurologic: No facial asymmetry. Sensation intact. Moving all extremities.   Psychiatric: Alert and oriented to person and place. Very pleasant, cooperative.   Labs on Admission: I have personally reviewed following labs and imaging studies  CBC: No results for input(s): WBC, NEUTROABS, HGB, HCT, MCV, PLT in the last 168 hours. Basic Metabolic Panel: No results for input(s): NA, K, CL, CO2, GLUCOSE, BUN, CREATININE, CALCIUM, MG, PHOS in the last 168 hours. GFR: CrCl cannot be calculated (Patient's most  recent lab result is older than the maximum 21 days allowed.). Liver Function Tests: No results for input(s): AST, ALT, ALKPHOS, BILITOT, PROT, ALBUMIN in the last 168 hours. No results for input(s): LIPASE, AMYLASE in the last 168 hours. No results for input(s): AMMONIA in the last 168 hours. Coagulation Profile: No results for input(s): INR, PROTIME in the last 168 hours. Cardiac Enzymes: No results for input(s): CKTOTAL, CKMB, CKMBINDEX, TROPONINI in the last 168 hours. BNP (last 3 results) No results for input(s): PROBNP in the last 8760 hours. HbA1C: No results for input(s): HGBA1C in the last 72 hours. CBG: No results for input(s): GLUCAP in the last 168 hours. Lipid Profile: No results for input(s): CHOL, HDL, LDLCALC, TRIG, CHOLHDL, LDLDIRECT in the last 72 hours. Thyroid Function Tests: No results for input(s): TSH, T4TOTAL, FREET4, T3FREE, THYROIDAB in the last 72 hours. Anemia Panel: No results for input(s): VITAMINB12, FOLATE, FERRITIN, TIBC, IRON, RETICCTPCT in the last 72 hours. Urine analysis: No results found for: COLORURINE, APPEARANCEUR, LABSPEC, PHURINE, GLUCOSEU, HGBUR, BILIRUBINUR, KETONESUR, PROTEINUR, UROBILINOGEN, NITRITE, LEUKOCYTESUR Sepsis Labs: @LABRCNTIP (procalcitonin:4,lacticidven:4) )No results found for this or any previous  visit (from the past 240 hour(s)).   Radiological Exams on Admission: No results found.  EKG: Independently reviewed. Demand pacing, atrial flutter, non-specific IVCD.   Assessment/Plan   1. COVID-19 pneumonia; acute on chronic hypoxic respiratory failure  - Presents with progressive SOB after testing positive for COVID-19 in [facility] ED on 12/2, found to have CXR findings concerning for multifocal PNA, increased supplemental O2 requirement, and procalcitonin 0.41  - On arrival to [facility], he is only slightly tachypneic, saturating 88-93% on 15 Lpm via NRB, and speaking full sentences  - Check sputum culture, strep pneumo and legionella  antigens, and trend inflammatory markers  - Continue Rocephin, azithromycin, Decadron, and remdesivir    2. Paroxysmal atrial fibrillation  - In rate-controlled atrial flutter in ED  - CHADS-VASc is at least 4 (age x2, HTN, DM) - Continue Eliquis, continue metoprolol as tolerated    3. Type II DM  - No recent A1c available  - Managed with metformin only at home, held on admission  - Start low-intensity SSI with Novolog and adjust as needed    4. Hypertension  - BP at goal, continue metoprolol and ramipril as tolerated    5. Hyperbilirubinemia  - Total bilirubin is 3.2 in ED with normal transaminases and benign abdominal exam  - Fractionate bilirubin, check INR      DVT prophylaxis: Eliquis  Code Status: DNR, paperwork at bedside  Family Communication: Discussed with patient   Consults called: None  Admission status: Inpatient    Vianne Bulls, MD [facility] Pager [phone]  If 7PM-7AM, please contact night-coverage [email or web address] Password [facility]  09/25/2019, 4:42 AM

## 2019-09-26 DIAGNOSIS — U071 COVID-19: Principal | ICD-10-CM

## 2019-09-26 DIAGNOSIS — E119 Type 2 diabetes mellitus without complications: Secondary | ICD-10-CM

## 2019-09-26 LAB — CBC WITH DIFFERENTIAL/PLATELET
Abs Immature Granulocytes: 0.14 10*3/uL — ABNORMAL HIGH (ref 0.00–0.07)
Basophils Absolute: 0 10*3/uL (ref 0.0–0.1)
Basophils Relative: 0 %
Eosinophils Absolute: 0 10*3/uL (ref 0.0–0.5)
Eosinophils Relative: 0 %
HCT: 46.5 % (ref 39.0–52.0)
Hemoglobin: 15.4 g/dL (ref 13.0–17.0)
Immature Granulocytes: 1 %
Lymphocytes Relative: 5 %
Lymphs Abs: 0.5 10*3/uL — ABNORMAL LOW (ref 0.7–4.0)
MCH: 31.6 pg (ref 26.0–34.0)
MCHC: 33.1 g/dL (ref 30.0–36.0)
MCV: 95.3 fL (ref 80.0–100.0)
Monocytes Absolute: 0.6 10*3/uL (ref 0.1–1.0)
Monocytes Relative: 5 %
Neutro Abs: 9.3 10*3/uL — ABNORMAL HIGH (ref 1.7–7.7)
Neutrophils Relative %: 89 %
Platelets: 220 10*3/uL (ref 150–400)
RBC: 4.88 MIL/uL (ref 4.22–5.81)
RDW: 14.1 % (ref 11.5–15.5)
WBC: 10.5 10*3/uL (ref 4.0–10.5)
nRBC: 0.2 % (ref 0.0–0.2)

## 2019-09-26 LAB — BPAM FFP
Blood Product Expiration Date: 202012071521
ISSUE DATE / TIME: 202012061531
Unit Type and Rh: 5100

## 2019-09-26 LAB — GLUCOSE, CAPILLARY
Glucose-Capillary: 184 mg/dL — ABNORMAL HIGH (ref 70–99)
Glucose-Capillary: 189 mg/dL — ABNORMAL HIGH (ref 70–99)
Glucose-Capillary: 280 mg/dL — ABNORMAL HIGH (ref 70–99)
Glucose-Capillary: 397 mg/dL — ABNORMAL HIGH (ref 70–99)
Glucose-Capillary: 230 mg/dL — ABNORMAL HIGH (ref 70–99)

## 2019-09-26 LAB — COMPREHENSIVE METABOLIC PANEL
ALT: 20 U/L (ref 0–44)
AST: 32 U/L (ref 15–41)
Albumin: 2.5 g/dL — ABNORMAL LOW (ref 3.5–5.0)
Alkaline Phosphatase: 141 U/L — ABNORMAL HIGH (ref 38–126)
Anion gap: 14 (ref 5–15)
BUN: 34 mg/dL — ABNORMAL HIGH (ref 8–23)
CO2: 29 mmol/L (ref 22–32)
Calcium: 8.8 mg/dL — ABNORMAL LOW (ref 8.9–10.3)
Chloride: 96 mmol/L — ABNORMAL LOW (ref 98–111)
Creatinine, Ser: 0.76 mg/dL (ref 0.61–1.24)
GFR calc Af Amer: >60 mL/min (ref >60)
GFR calc non Af Amer: >60 mL/min (ref >60)
Glucose, Bld: 214 mg/dL — ABNORMAL HIGH (ref 70–99)
Potassium: 4.3 mmol/L (ref 3.5–5.1)
Sodium: 139 mmol/L (ref 135–145)
Total Bilirubin: 1.7 mg/dL — ABNORMAL HIGH (ref 0.3–1.2)
Total Protein: 6.4 g/dL — ABNORMAL LOW (ref 6.5–8.1)

## 2019-09-26 LAB — BILIRUBIN, FRACTIONATED(TOT/DIR/INDIR)
Bilirubin, Direct: 0.8 mg/dL — ABNORMAL HIGH (ref 0.0–0.2)
Indirect Bilirubin: 1 mg/dL — ABNORMAL HIGH (ref 0.3–0.9)
Total Bilirubin: 1.8 mg/dL — ABNORMAL HIGH (ref 0.3–1.2)

## 2019-09-26 LAB — PREPARE FRESH FROZEN PLASMA

## 2019-09-26 LAB — C-REACTIVE PROTEIN: CRP: 9.1 mg/dL — ABNORMAL HIGH (ref <1.0)

## 2019-09-26 LAB — LEGIONELLA PNEUMOPHILA SEROGP 1 UR AG: L. pneumophila Serogp 1 Ur Ag: NEGATIVE

## 2019-09-26 LAB — PHOSPHORUS: Phosphorus: 3.3 mg/dL (ref 2.5–4.6)

## 2019-09-26 LAB — D-DIMER, QUANTITATIVE: D-Dimer, Quant: 1.89 ug/mL-FEU — ABNORMAL HIGH (ref 0.00–0.50)

## 2019-09-26 LAB — MAGNESIUM: Magnesium: 1.9 mg/dL (ref 1.7–2.4)

## 2019-09-26 MED ORDER — INSULIN DETEMIR 100 UNIT/ML ~~LOC~~ SOLN
6.0000 [IU] | Freq: Every day | SUBCUTANEOUS | Status: DC
  Administered 2019-09-26 – 2019-10-04 (×9): 6 [IU] via SUBCUTANEOUS
  Filled 2019-09-26 (×9): qty 0.06

## 2019-09-26 NOTE — Progress Notes (Signed)
PROGRESS NOTE                                                                                                                                                                                                             Patient Demographics:    Robert Cuevas, is a 85 y.o. male, DOB - 1930-06-13, [id number]  Admit date - 09/25/2019   Admitting Physician Vianne Bulls, MD  Outpatient Primary MD for the patient is [facility]  LOS - 1   No chief complaint on file.      Brief Narrative   85 y.o. male with medical history significant for complete heart block with pacer, hypertension, paroxysmal atrial fibrillation on Eliquis, chronic hypoxic respiratory failure, and non-insulin-dependent diabetes mellitus, diagnosed with COVID-19 on 09/21/2019 and now presenting with progressive shortness of breath.  Patient was seen in the emergency department on 09/21/2019 to request COVID-19 testing because his wife was positive for the virus though the patient denies any symptoms at that time.  He since developed progressive shortness of breath and went to [facility] ED for evaluation, patient noted to be profoundly hypoxic, requiring BiPAP 100% at [facility] ED, he was transferred to [facility] for further management.   Subjective:    Robert Cuevas today reports he cannot eat given he does not have his dentures, and report poor vision at baseline especially he does not have his glasses right now .   Assessment  & Plan :    Principal Problem:   Acute on chronic respiratory failure with hypoxia (HCC) Active Problems:   Complete heart block (HCC)   Paroxysmal atrial fibrillation ([place])   COVID-19 virus infection   Diabetes mellitus type II, non insulin dependent (HCC)   Acute respiratory failure due to COVID-19 ([place])   Hyperbilirubinemia   Pressure injury of skin  COVID-19 pneumonia; acute on chronic hypoxic respiratory failure  - Presents with progressive SOB  after testing positive for COVID-19 in [facility] ED on 12/2, found to have CXR findings concerning for multifocal PNA, increased supplemental O2 requirement, and procalcitonin 0.41  -Patient remains with significant oxygen requirement, he is currently on heated high flow 30 L nasal cannula. -Received convalescent plasma 12/6. -Continue with IV steroids. -Received Actemra 12/6. -Continue with IV remdesivir. -Continue to trend inflammatory markers closely given significantly elevated on presentation. - Continue Rocephin,  azithromycin, Decadron, and remdesivir    COVID-19 Labs  Recent Labs    09/25/19 0452 09/26/19 0437  DDIMER 2.09* 1.89*  CRP 13.3* 9.1*   Paroxysmal atrial fibrillation  - In rate-controlled atrial flutter in ED  - CHADS-VASc is at least 4 (age x2, HTN, DM) - Continue Eliquis, continue metoprolol as tolerated    Type II DM  - No recent A1c available  - Managed with metformin only at home, held on admission  - Start low-intensity SSI with Novolog and adjust as needed    Hypertension  - BP at goal, continue metoprolol and ramipril as tolerated    Hyperbilirubinemia  - Total bilirubin is 3.2 in ED with normal transaminases and benign abdominal exam  - Fractionate bilirubin, check INR     Code Status : DNR  Family Communication  : D/W wife 12/6  Disposition Plan  : Will transfer out of ICU to the heated flow specialized unit in progressive care  Consults  :  None  Procedures  : None  DVT Prophylaxis  :  On Eliquis  Lab Results  Component Value Date   PLT 220 09/26/2019    Antibiotics  :    Anti-infectives (From admission, onward)   Start     Dose/Rate Route Frequency Ordered Stop   09/26/19 1000  remdesivir 100 mg in sodium chloride 0.9 % 100 mL IVPB  Status:  Discontinued     100 mg 200 mL/hr over 30 Minutes Intravenous Daily 09/25/19 0353 09/25/19 0406   09/25/19 1100  azithromycin (ZITHROMAX) 500 mg in sodium chloride 0.9 % 250 mL IVPB     500 mg  250 mL/hr over 60 Minutes Intravenous Every 24 hours 09/25/19 0438 09/29/19 1059   09/25/19 1000  remdesivir 100 mg in sodium chloride 0.9 % 100 mL IVPB     100 mg 200 mL/hr over 30 Minutes Intravenous Daily 09/25/19 0407 09/29/19 0959   09/25/19 1000  cefTRIAXone (ROCEPHIN) 2 g in sodium chloride 0.9 % 100 mL IVPB     2 g 200 mL/hr over 30 Minutes Intravenous Every 24 hours 09/25/19 0438 09/29/19 0959   09/25/19 0400  remdesivir 200 mg in sodium chloride 0.9% 250 mL IVPB  Status:  Discontinued     200 mg 580 mL/hr over 30 Minutes Intravenous Once 09/25/19 0353 09/25/19 0406        Objective:   Vitals:   09/26/19 1420 09/26/19 1559 09/26/19 1700 09/26/19 1800  BP:  130/63 136/65 (!) 134/58  Pulse:  74 70 69  Resp: (!) 26 (!) 25 (!) 23 (!) 28  Temp: 97.7 F (36.5 C) 97.6 F (36.4 C)    TempSrc: Oral Oral    SpO2: 92% 90% 94% 90%  Weight:      Height:        Wt Readings from Last 3 Encounters:  09/25/19 60.8 kg  03/03/18 63.9 kg  02/27/17 66.2 kg     Intake/Output Summary (Last 24 hours) at 09/26/2019 1851 Last data filed at 09/26/2019 1040 Gross per 24 hour  Intake 366.77 ml  Output 500 ml  Net -133.23 ml     Physical Exam  Awake Alert, Oriented X 3, No new F.N deficits, Normal affect Symmetrical Chest wall movement, Good air movement bilaterally, CTAB RRR,No Gallops,Rubs or new Murmurs, No Parasternal Heave +ve B.Sounds, Abd Soft, No tenderness, No rebound - guarding or rigidity. No Cyanosis, Clubbing or edema, No new Rash or bruise  Data Review:    CBC Recent Labs  Lab 09/25/19 0452 09/26/19 0437  WBC 12.5* 10.5  HGB 16.3 15.4  HCT 48.3 46.5  PLT 221 220  MCV 94.5 95.3  MCH 31.9 31.6  MCHC 33.7 33.1  RDW 14.1 14.1  LYMPHSABS 0.4* 0.5*  MONOABS 0.8 0.6  EOSABS 0.0 0.0  BASOSABS 0.1 0.0    Chemistries  Recent Labs  Lab 09/25/19 0452 09/26/19 0437  NA 137 139  K 4.3 4.3  CL 92* 96*  CO2 30 29  GLUCOSE 223* 214*  BUN 32* 34*   CREATININE 0.87 0.76  CALCIUM 8.7* 8.8*  MG 1.8 1.9  AST 32 32  ALT 22 20  ALKPHOS 154* 141*  BILITOT 1.8*  2.3* 1.7*   ------------------------------------------------------------------------------------------------------------------ No results for input(s): CHOL, HDL, LDLCALC, TRIG, CHOLHDL, LDLDIRECT in the last 72 hours.  Lab Results  Component Value Date   HGBA1C 8.4 (H) 09/25/2019   ------------------------------------------------------------------------------------------------------------------ No results for input(s): TSH, T4TOTAL, T3FREE, THYROIDAB in the last 72 hours.  Invalid input(s): FREET3 ------------------------------------------------------------------------------------------------------------------ No results for input(s): VITAMINB12, FOLATE, FERRITIN, TIBC, IRON, RETICCTPCT in the last 72 hours.  Coagulation profile No results for input(s): INR, PROTIME in the last 168 hours.  Recent Labs    09/25/19 0452 09/26/19 0437  DDIMER 2.09* 1.89*    Cardiac Enzymes No results for input(s): CKMB, TROPONINI, MYOGLOBIN in the last 168 hours.  Invalid input(s): CK ------------------------------------------------------------------------------------------------------------------ No results found for: BNP  Inpatient Medications  Scheduled Meds: . apixaban  5 mg Oral BID  . atorvastatin  20 mg Oral q1800  . Chlorhexidine Gluconate Cloth  6 each Topical Daily  . dexamethasone (DECADRON) injection  6 mg Intravenous Q24H  . famotidine  20 mg Oral BID  . insulin aspart  0-9 Units Subcutaneous TID WC  . insulin detemir  6 Units Subcutaneous Daily  . mouth rinse  15 mL Mouth Rinse BID  . metoprolol tartrate  50 mg Oral BID  . ramipril  2.5 mg Oral Daily  . sodium chloride flush  3 mL Intravenous Q12H  . sodium chloride flush  3 mL Intravenous Q12H   Continuous Infusions: . sodium chloride 10 mL/hr at 09/26/19 0900  . azithromycin 500 mg (09/26/19 1116)  .  cefTRIAXone (ROCEPHIN)  IV 2 g (09/26/19 0950)  . remdesivir 100 mg in NS 100 mL 100 mg (09/26/19 1040)   PRN Meds:.sodium chloride, acetaminophen, guaiFENesin-dextromethorphan, ondansetron **OR** ondansetron (ZOFRAN) IV, sodium chloride flush  Micro Results Recent Results (from the past 240 hour(s))  MRSA PCR Screening     Status: None   Collection Time: 09/25/19  4:33 AM   Specimen: Nasal Mucosa; Nasopharyngeal  Result Value Ref Range Status   MRSA by PCR NEGATIVE NEGATIVE Final    Comment:        The GeneXpert MRSA Assay (FDA approved for NASAL specimens only), is one component of a comprehensive MRSA colonization surveillance program. It is not intended to diagnose MRSA infection nor to guide or monitor treatment for MRSA infections. Performed at [facility], [place] [address] [place], [place] [address]     Radiology Reports Dg Chest Port 1 View  Result Date: 09/25/2019 CLINICAL DATA:  COVID-19 EXAM: PORTABLE CHEST 1 VIEW COMPARISON:  Portable exam [id number] hours compared to 09/24/2019 FINDINGS: RIGHT subclavian transvenous pacemaker leads project over RIGHT atrium and RIGHT ventricle. Enlargement of cardiac silhouette. Atherosclerotic calcification aorta. Mediastinal contours and pulmonary vascularity normal. Patchy airspace infiltrates bilaterally consistent with multifocal  pneumonia and history of COVID-19, greater on RIGHT. Skin fold projects over LEFT chest. No pleural effusion or pneumothorax. Diffuse osseous demineralization with BILATERAL glenohumeral degenerative changes and probable BILATERAL chronic rotator cuff tears. IMPRESSION: Patchy BILATERAL pulmonary infiltrates RIGHT greater than LEFT consistent with multifocal pneumonia and history of COVID-19, little changed. Aortic Atherosclerosis (ICD10-I70.0). Electronically Signed   By: Lavonia Dana M.D.   On: 09/25/2019 15:28     Phillips Climes M.D on 09/26/2019 at 6:51 PM  Between 7am to 7pm - Pager -  [phone]  After 7pm go to [email or web address] - password [facility]  [facility] -  Office  [phone]

## 2019-09-26 NOTE — Progress Notes (Signed)
Patient remains AOx4.  He has difficulty eating because he was transported to hospital without his full denture set.  He has been consuming Vital shakes with 20g protein.  His wife calls q4 hrs for update.  Patient states his daughter is planning to bring his reading glasses and his dentures today.  Patient transferring down stairs to a high flow room.  Vitals WDL.  UOP increasing.

## 2019-09-27 DIAGNOSIS — J9600 Acute respiratory failure, unspecified whether with hypoxia or hypercapnia: Secondary | ICD-10-CM

## 2019-09-27 DIAGNOSIS — I442 Atrioventricular block, complete: Secondary | ICD-10-CM

## 2019-09-27 LAB — CBC WITH DIFFERENTIAL/PLATELET
Abs Immature Granulocytes: 0.17 10*3/uL — ABNORMAL HIGH (ref 0.00–0.07)
Basophils Absolute: 0.1 10*3/uL (ref 0.0–0.1)
Basophils Relative: 1 %
Eosinophils Absolute: 0 10*3/uL (ref 0.0–0.5)
Eosinophils Relative: 0 %
HCT: 48.4 % (ref 39.0–52.0)
Hemoglobin: 15.8 g/dL (ref 13.0–17.0)
Immature Granulocytes: 2 %
Lymphocytes Relative: 4 %
Lymphs Abs: 0.4 10*3/uL — ABNORMAL LOW (ref 0.7–4.0)
MCH: 31.1 pg (ref 26.0–34.0)
MCHC: 32.6 g/dL (ref 30.0–36.0)
MCV: 95.3 fL (ref 80.0–100.0)
Monocytes Absolute: 0.4 10*3/uL (ref 0.1–1.0)
Monocytes Relative: 4 %
Neutro Abs: 9.3 10*3/uL — ABNORMAL HIGH (ref 1.7–7.7)
Neutrophils Relative %: 89 %
Platelets: 207 10*3/uL (ref 150–400)
RBC: 5.08 MIL/uL (ref 4.22–5.81)
RDW: 14.5 % (ref 11.5–15.5)
WBC: 10.3 10*3/uL (ref 4.0–10.5)
nRBC: 0 % (ref 0.0–0.2)

## 2019-09-27 LAB — COMPREHENSIVE METABOLIC PANEL
ALT: 24 U/L (ref 0–44)
AST: 41 U/L (ref 15–41)
Albumin: 2.6 g/dL — ABNORMAL LOW (ref 3.5–5.0)
Alkaline Phosphatase: 140 U/L — ABNORMAL HIGH (ref 38–126)
Anion gap: 12 (ref 5–15)
BUN: 36 mg/dL — ABNORMAL HIGH (ref 8–23)
CO2: 27 mmol/L (ref 22–32)
Calcium: 8.4 mg/dL — ABNORMAL LOW (ref 8.9–10.3)
Chloride: 99 mmol/L (ref 98–111)
Creatinine, Ser: 0.68 mg/dL (ref 0.61–1.24)
GFR calc Af Amer: >60 mL/min (ref >60)
GFR calc non Af Amer: >60 mL/min (ref >60)
Glucose, Bld: 163 mg/dL — ABNORMAL HIGH (ref 70–99)
Potassium: 4 mmol/L (ref 3.5–5.1)
Sodium: 138 mmol/L (ref 135–145)
Total Bilirubin: 1.5 mg/dL — ABNORMAL HIGH (ref 0.3–1.2)
Total Protein: 6.2 g/dL — ABNORMAL LOW (ref 6.5–8.1)

## 2019-09-27 LAB — GLUCOSE, CAPILLARY
Glucose-Capillary: 142 mg/dL — ABNORMAL HIGH (ref 70–99)
Glucose-Capillary: 234 mg/dL — ABNORMAL HIGH (ref 70–99)
Glucose-Capillary: 276 mg/dL — ABNORMAL HIGH (ref 70–99)
Glucose-Capillary: 283 mg/dL — ABNORMAL HIGH (ref 70–99)

## 2019-09-27 LAB — D-DIMER, QUANTITATIVE: D-Dimer, Quant: 2.98 ug/mL-FEU — ABNORMAL HIGH (ref 0.00–0.50)

## 2019-09-27 LAB — PHOSPHORUS: Phosphorus: 2.8 mg/dL (ref 2.5–4.6)

## 2019-09-27 LAB — C-REACTIVE PROTEIN: CRP: 5.3 mg/dL — ABNORMAL HIGH (ref <1.0)

## 2019-09-27 LAB — MAGNESIUM: Magnesium: 1.9 mg/dL (ref 1.7–2.4)

## 2019-09-27 MED ORDER — INSULIN ASPART 100 UNIT/ML ~~LOC~~ SOLN
2.0000 [IU] | Freq: Three times a day (TID) | SUBCUTANEOUS | Status: DC
  Administered 2019-09-27 – 2019-10-04 (×22): 2 [IU] via SUBCUTANEOUS

## 2019-09-27 MED ORDER — ENSURE ENLIVE PO LIQD
237.0000 mL | Freq: Three times a day (TID) | ORAL | Status: DC
  Administered 2019-09-27 – 2019-10-07 (×25): 237 mL via ORAL

## 2019-09-27 NOTE — Progress Notes (Signed)
Inpatient Diabetes Program Recommendations  AACE/ADA: New Consensus Statement on Inpatient Glycemic Control (2015)  Target Ranges:  Prepandial:   less than 140 mg/dL      Peak postprandial:   less than 180 mg/dL (1-2 hours)      Critically ill patients:  140 - 180 mg/dL   Lab Results  Component Value Date   GLUCAP 234 (H) 09/27/2019   HGBA1C 8.4 (H) 09/25/2019    Review of Glycemic Control Results for Robert Cuevas, Robert Cuevas (MRN [id number]) as of 09/27/2019 12:35  Ref. Range 09/26/2019 11:41 09/26/2019 16:43 09/26/2019 20:58 09/27/2019 07:22 09/27/2019 11:13  Glucose-Capillary Latest Ref Range: 70 - 99 mg/dL 280 (H) 397 (H) 230 (H) 142 (H) 234 (H)   Diabetes history: DM2 Outpatient Diabetes medications: Metformin 500 mg bid Current orders for Inpatient glycemic control: Levmir 6 units qd + Novolog sensitive correction tid  Inpatient Diabetes Program Recommendations:   Noted post prandial CBGs elevated. Please consider while on steroids: -Add Novolog 2 units tid meal coverage if eats 50%  Thank you, Bethena Roys E. Elam Ellis, RN, MSN, CDE  Diabetes Coordinator Inpatient Glycemic Control Team Team Pager [phone] (8am-5pm) 09/27/2019 12:37 PM

## 2019-09-27 NOTE — Plan of Care (Signed)

## 2019-09-27 NOTE — Discharge Instructions (Signed)

## 2019-09-27 NOTE — Progress Notes (Addendum)
PROGRESS NOTE                                                                                                                                                                                                             Patient Demographics:    Robert Cuevas, is a 85 y.o. male, DOB - 12-22-1929, [id number]  Admit date - 09/25/2019   Admitting Physician Vianne Bulls, MD  Outpatient Primary MD for the patient is [facility]  LOS - 2   No chief complaint on file.      Brief Narrative   85 y.o. male with medical history significant for complete heart block with pacer, hypertension, paroxysmal atrial fibrillation on Eliquis, chronic hypoxic respiratory failure, and non-insulin-dependent diabetes mellitus, diagnosed with COVID-19 on 09/21/2019 and now presenting with progressive shortness of breath.  Patient was seen in the emergency department on 09/21/2019 to request COVID-19 testing because his wife was positive for the virus though the patient denies any symptoms at that time.  He since developed progressive shortness of breath and went to [facility] ED for evaluation, patient noted to be profoundly hypoxic, requiring BiPAP 100% at [facility] ED, he was transferred to [facility] for further management.   Subjective:    Robert Cuevas today reports he cannot eat given he does not have his dentures, and report poor vision at baseline especially he does not have his glasses right now .   Assessment  & Plan :    Principal Problem:   Acute on chronic respiratory failure with hypoxia (HCC) Active Problems:   Complete heart block (HCC)   Paroxysmal atrial fibrillation ([place])   COVID-19 virus infection   Diabetes mellitus type II, non insulin dependent (HCC)   Acute respiratory failure due to COVID-19 ([place])   Hyperbilirubinemia   Pressure injury of skin  COVID-19 pneumonia; acute on chronic hypoxic respiratory failure  - Presents with progressive SOB  after testing positive for COVID-19 in [facility] ED on 12/2, found to have CXR findings concerning for multifocal PNA, increased supplemental O2 requirement, and procalcitonin 0.41  -Patient remains with significant oxygen requirement, he is currently on heated high flow 30 L nasal cannula. -Received convalescent plasma 12/6. -Continue with IV steroids. -Received Actemra 12/6. -Continue with IV remdesivir. -Continue to trend inflammatory markers closely given significantly elevated on presentation. - Continue Rocephin,  azithromycin, Decadron, and remdesivir    COVID-19 Labs  Recent Labs    09/25/19 0452 09/26/19 0437 09/27/19 0251  DDIMER 2.09* 1.89* 2.98*  CRP 13.3* 9.1* 5.3*   Paroxysmal atrial fibrillation  - In rate-controlled atrial flutter in ED  - CHADS-VASc is at least 4 (age x2, HTN, DM) - Continue Eliquis, continue metoprolol as tolerated    Type II DM  - No recent A1c available  - Managed with metformin only at home, held on admission  - Start low-intensity SSI with Novolog and adjust as needed  , added 2 units of insulin before meals.  Hypertension  - BP at goal, continue metoprolol and ramipril as tolerated    Hyperbilirubinemia  - Total bilirubin is 3.2 in ED, this is most likely due to COVID-19, it is improving   Code Status : DNR  Family Communication  : Updated his wife today  Consults  :  None  Procedures  : None  DVT Prophylaxis  :  On Eliquis  Lab Results  Component Value Date   PLT 207 09/27/2019    Antibiotics  :    Anti-infectives (From admission, onward)   Start     Dose/Rate Route Frequency Ordered Stop   09/26/19 1000  remdesivir 100 mg in sodium chloride 0.9 % 100 mL IVPB  Status:  Discontinued     100 mg 200 mL/hr over 30 Minutes Intravenous Daily 09/25/19 0353 09/25/19 0406   09/25/19 1100  azithromycin (ZITHROMAX) 500 mg in sodium chloride 0.9 % 250 mL IVPB     500 mg 250 mL/hr over 60 Minutes Intravenous Every 24 hours 09/25/19  0438 09/29/19 1059   09/25/19 1000  remdesivir 100 mg in sodium chloride 0.9 % 100 mL IVPB     100 mg 200 mL/hr over 30 Minutes Intravenous Daily 09/25/19 0407 09/29/19 0959   09/25/19 1000  cefTRIAXone (ROCEPHIN) 2 g in sodium chloride 0.9 % 100 mL IVPB     2 g 200 mL/hr over 30 Minutes Intravenous Every 24 hours 09/25/19 0438 09/29/19 0959   09/25/19 0400  remdesivir 200 mg in sodium chloride 0.9% 250 mL IVPB  Status:  Discontinued     200 mg 580 mL/hr over 30 Minutes Intravenous Once 09/25/19 0353 09/25/19 0406        Objective:   Vitals:   09/27/19 1530 09/27/19 1600 09/27/19 1700 09/27/19 1710  BP:  (!) 149/71 (!) 160/76   Pulse: 70 71 70 73  Resp: (!) 25 (!) 22 (!) 28 20  Temp:  98 F (36.7 C)    TempSrc:  Oral    SpO2: (!) 89% 91% (!) 89% 91%  Weight:      Height:        Wt Readings from Last 3 Encounters:  09/25/19 60.8 kg  03/03/18 63.9 kg  02/27/17 66.2 kg     Intake/Output Summary (Last 24 hours) at 09/27/2019 1735 Last data filed at 09/27/2019 1700 Gross per 24 hour  Intake 1980 ml  Output 2175 ml  Net -195 ml     Physical Exam  Awake Alert, Oriented X 3, No new F.N deficits, Normal affect Symmetrical Chest wall movement, Good air movement bilaterally, CTAB RRR,No Gallops,Rubs or new Murmurs, No Parasternal Heave +ve B.Sounds, Abd Soft, No tenderness, No rebound - guarding or rigidity. No Cyanosis, Clubbing or edema, No new Rash or bruise        Data Review:    CBC Recent Labs  Lab 09/25/19 [phone]  09/26/19 0437 09/27/19 0251  WBC 12.5* 10.5 10.3  HGB 16.3 15.4 15.8  HCT 48.3 46.5 48.4  PLT 221 220 207  MCV 94.5 95.3 95.3  MCH 31.9 31.6 31.1  MCHC 33.7 33.1 32.6  RDW 14.1 14.1 14.5  LYMPHSABS 0.4* 0.5* 0.4*  MONOABS 0.8 0.6 0.4  EOSABS 0.0 0.0 0.0  BASOSABS 0.1 0.0 0.1    Chemistries  Recent Labs  Lab 09/25/19 0452 09/26/19 0437 09/27/19 0251  NA 137 139 138  K 4.3 4.3 4.0  CL 92* 96* 99  CO2 30 29 27   GLUCOSE 223* 214*  163*  BUN 32* 34* 36*  CREATININE 0.87 0.76 0.68  CALCIUM 8.7* 8.8* 8.4*  MG 1.8 1.9 1.9  AST 32 32 41  ALT 22 20 24   ALKPHOS 154* 141* 140*  BILITOT 1.8*  2.3* 1.7* 1.5*   ------------------------------------------------------------------------------------------------------------------ No results for input(s): CHOL, HDL, LDLCALC, TRIG, CHOLHDL, LDLDIRECT in the last 72 hours.  Lab Results  Component Value Date   HGBA1C 8.4 (H) 09/25/2019   ------------------------------------------------------------------------------------------------------------------ No results for input(s): TSH, T4TOTAL, T3FREE, THYROIDAB in the last 72 hours.  Invalid input(s): FREET3 ------------------------------------------------------------------------------------------------------------------ No results for input(s): VITAMINB12, FOLATE, FERRITIN, TIBC, IRON, RETICCTPCT in the last 72 hours.  Coagulation profile No results for input(s): INR, PROTIME in the last 168 hours.  Recent Labs    09/26/19 0437 09/27/19 0251  DDIMER 1.89* 2.98*    Cardiac Enzymes No results for input(s): CKMB, TROPONINI, MYOGLOBIN in the last 168 hours.  Invalid input(s): CK ------------------------------------------------------------------------------------------------------------------ No results found for: BNP  Inpatient Medications  Scheduled Meds: . apixaban  5 mg Oral BID  . atorvastatin  20 mg Oral q1800  . Chlorhexidine Gluconate Cloth  6 each Topical Daily  . dexamethasone (DECADRON) injection  6 mg Intravenous Q24H  . famotidine  20 mg Oral BID  . insulin aspart  0-9 Units Subcutaneous TID WC  . insulin detemir  6 Units Subcutaneous Daily  . mouth rinse  15 mL Mouth Rinse BID  . metoprolol tartrate  50 mg Oral BID  . ramipril  2.5 mg Oral Daily  . sodium chloride flush  3 mL Intravenous Q12H  . sodium chloride flush  3 mL Intravenous Q12H   Continuous Infusions: . sodium chloride 10 mL/hr at  09/26/19 0900  . azithromycin 500 mg (09/27/19 1012)  . cefTRIAXone (ROCEPHIN)  IV 2 g (09/27/19 0919)  . remdesivir 100 mg in NS 100 mL 100 mg (09/27/19 0919)   PRN Meds:.sodium chloride, acetaminophen, guaiFENesin-dextromethorphan, ondansetron **OR** ondansetron (ZOFRAN) IV, sodium chloride flush  Micro Results Recent Results (from the past 240 hour(s))  MRSA PCR Screening     Status: None   Collection Time: 09/25/19  4:33 AM   Specimen: Nasal Mucosa; Nasopharyngeal  Result Value Ref Range Status   MRSA by PCR NEGATIVE NEGATIVE Final    Comment:        The GeneXpert MRSA Assay (FDA approved for NASAL specimens only), is one component of a comprehensive MRSA colonization surveillance program. It is not intended to diagnose MRSA infection nor to guide or monitor treatment for MRSA infections. Performed at [facility], [place] [address], [place], [place] [address]     Radiology Reports Dg Chest Port 1 View  Result Date: 09/25/2019 CLINICAL DATA:  COVID-19 EXAM: PORTABLE CHEST 1 VIEW COMPARISON:  Portable exam [id number] hours compared to 09/24/2019 FINDINGS: RIGHT subclavian transvenous pacemaker leads project over RIGHT atrium and RIGHT ventricle. Enlargement of cardiac  silhouette. Atherosclerotic calcification aorta. Mediastinal contours and pulmonary vascularity normal. Patchy airspace infiltrates bilaterally consistent with multifocal pneumonia and history of COVID-19, greater on RIGHT. Skin fold projects over LEFT chest. No pleural effusion or pneumothorax. Diffuse osseous demineralization with BILATERAL glenohumeral degenerative changes and probable BILATERAL chronic rotator cuff tears. IMPRESSION: Patchy BILATERAL pulmonary infiltrates RIGHT greater than LEFT consistent with multifocal pneumonia and history of COVID-19, little changed. Aortic Atherosclerosis (ICD10-I70.0). Electronically Signed   By: Lavonia Dana M.D.   On: 09/25/2019 15:28     Phillips Climes M.D  on 09/27/2019 at 5:35 PM  Between 7am to 7pm - Pager - [phone]  After 7pm go to [email or web address] - password [facility]  [facility] -  Office  [phone]

## 2019-09-27 NOTE — Progress Notes (Signed)
Called wife Ruby at the beside and updated her to the patient's condition and answered any questions.

## 2019-09-28 LAB — CBC WITH DIFFERENTIAL/PLATELET
Abs Immature Granulocytes: 0.2 10*3/uL — ABNORMAL HIGH (ref 0.00–0.07)
Basophils Absolute: 0.1 10*3/uL (ref 0.0–0.1)
Basophils Relative: 1 %
Eosinophils Absolute: 0.1 10*3/uL (ref 0.0–0.5)
Eosinophils Relative: 1 %
HCT: 54.5 % — ABNORMAL HIGH (ref 39.0–52.0)
Hemoglobin: 17.8 g/dL — ABNORMAL HIGH (ref 13.0–17.0)
Immature Granulocytes: 2 %
Lymphocytes Relative: 5 %
Lymphs Abs: 0.5 10*3/uL — ABNORMAL LOW (ref 0.7–4.0)
MCH: 31.9 pg (ref 26.0–34.0)
MCHC: 32.7 g/dL (ref 30.0–36.0)
MCV: 97.7 fL (ref 80.0–100.0)
Monocytes Absolute: 0.2 10*3/uL (ref 0.1–1.0)
Monocytes Relative: 2 %
Neutro Abs: 8.9 10*3/uL — ABNORMAL HIGH (ref 1.7–7.7)
Neutrophils Relative %: 89 %
Platelets: 192 10*3/uL (ref 150–400)
RBC: 5.58 MIL/uL (ref 4.22–5.81)
RDW: 14.6 % (ref 11.5–15.5)
WBC: 10.1 10*3/uL (ref 4.0–10.5)
nRBC: 0 % (ref 0.0–0.2)

## 2019-09-28 LAB — MAGNESIUM: Magnesium: 2 mg/dL (ref 1.7–2.4)

## 2019-09-28 LAB — COMPREHENSIVE METABOLIC PANEL
ALT: 30 U/L (ref 0–44)
AST: 53 U/L — ABNORMAL HIGH (ref 15–41)
Albumin: 3 g/dL — ABNORMAL LOW (ref 3.5–5.0)
Alkaline Phosphatase: 150 U/L — ABNORMAL HIGH (ref 38–126)
Anion gap: 11 (ref 5–15)
BUN: 33 mg/dL — ABNORMAL HIGH (ref 8–23)
CO2: 31 mmol/L (ref 22–32)
Calcium: 8.7 mg/dL — ABNORMAL LOW (ref 8.9–10.3)
Chloride: 98 mmol/L (ref 98–111)
Creatinine, Ser: 0.6 mg/dL — ABNORMAL LOW (ref 0.61–1.24)
GFR calc Af Amer: >60 mL/min (ref >60)
GFR calc non Af Amer: >60 mL/min (ref >60)
Glucose, Bld: 189 mg/dL — ABNORMAL HIGH (ref 70–99)
Potassium: 4 mmol/L (ref 3.5–5.1)
Sodium: 140 mmol/L (ref 135–145)
Total Bilirubin: 2.3 mg/dL — ABNORMAL HIGH (ref 0.3–1.2)
Total Protein: 6.8 g/dL (ref 6.5–8.1)

## 2019-09-28 LAB — C-REACTIVE PROTEIN: CRP: 3.2 mg/dL — ABNORMAL HIGH (ref <1.0)

## 2019-09-28 LAB — GLUCOSE, CAPILLARY
Glucose-Capillary: 171 mg/dL — ABNORMAL HIGH (ref 70–99)
Glucose-Capillary: 251 mg/dL — ABNORMAL HIGH (ref 70–99)
Glucose-Capillary: 186 mg/dL — ABNORMAL HIGH (ref 70–99)
Glucose-Capillary: 194 mg/dL — ABNORMAL HIGH (ref 70–99)

## 2019-09-28 LAB — D-DIMER, QUANTITATIVE: D-Dimer, Quant: 5.8 ug/mL-FEU — ABNORMAL HIGH (ref 0.00–0.50)

## 2019-09-28 LAB — PHOSPHORUS: Phosphorus: 2.5 mg/dL (ref 2.5–4.6)

## 2019-09-28 MED ORDER — LIP MEDEX EX OINT
TOPICAL_OINTMENT | CUTANEOUS | Status: DC | PRN
  Filled 2019-09-28: qty 7

## 2019-09-28 MED ORDER — PNEUMOCOCCAL VAC POLYVALENT 25 MCG/0.5ML IJ INJ
0.5000 mL | INJECTION | INTRAMUSCULAR | Status: AC
  Administered 2019-10-08: 0.5 mL via INTRAMUSCULAR
  Filled 2019-09-28 (×2): qty 0.5

## 2019-09-28 MED ORDER — SALINE SPRAY 0.65 % NA SOLN
1.0000 | NASAL | Status: DC | PRN
  Administered 2019-09-28: 1 via NASAL
  Filled 2019-09-28: qty 44

## 2019-09-28 MED ORDER — WHITE PETROLATUM EX OINT
TOPICAL_OINTMENT | CUTANEOUS | Status: DC | PRN
  Administered 2019-09-28: 0.2 via TOPICAL
  Filled 2019-09-28: qty 28.35

## 2019-09-28 NOTE — Evaluation (Signed)
Physical Therapy Evaluation Patient Details Name: Robert Cuevas MRN: [id number] DOB: May 10, 1930 Today's Date: 09/28/2019   History of Present Illness  Pt is an 85 y.o. male admitted 09/25/19 with worsening SOB; initially tested (+) COVID-19 on 09/21/19. PMH includes afib, DM2, HTN.    Clinical Impression  Pt presents with an overall decrease in functional mobility secondary to above. PTA, pt reports mod indep with intermittent use of SPC, lives with wife. Today, pt requiring modA to stand and take steps to recliner with RW. Pt limited by hypoxia with minimal activity (see values below). Pt motivated to participate and appreciative of getting out of bed. Pt would benefit from continued acute PT services to maximize functional mobility and independence prior to d/c with SNF-level therapies.  SpO2 down to 60s coming to sit EOB on 35L HHFNC 90% FiO2; slow to recover with seated rest Maintaining 70s-low 80s with standing and transfer to recliner RN increased to 50L HHFNC 100% FiO2 to aide recovery Pt eventually requiring 15L NRB in addition to this to maintain SpO2 upper 80s Returned to original settings of 35L 90% FiO2, maintaining >/86% in recliner  RN Marzetta Board) present and very supportive to aid with O2/mobility needs     Follow Up Recommendations SNF;Supervision for mobility/OOB    Equipment Recommendations  Other (comment)(TBD)    Recommendations for Other Services       Precautions / Restrictions Precautions Precautions: Fall Precaution Comments: HHFNC, required NRB additionally to recover with RN Restrictions Weight Bearing Restrictions: No      Mobility  Bed Mobility Overal bed mobility: Needs Assistance Bed Mobility: Supine to Sit     Supine to sit: Mod assist;HOB elevated     General bed mobility comments: HHA and modA to elevate trunk. SpO2 down to 60s sitting EOB with slow return to upper 70s-low 80s on 35L HHFNC 90% FiO2  Transfers Overall transfer level: Needs  assistance Equipment used: Rolling walker (2 wheeled) Transfers: Sit to/from Stand Sit to Stand: Mod assist         General transfer comment: ModA to assist trunk elevation standing from bed and recliner to RW, cues for hand placement  Ambulation/Gait Ambulation/Gait assistance: Mod assist Gait Distance (Feet): 1 Feet Assistive device: Rolling walker (2 wheeled)       General Gait Details: Pivotal steps from bed to recliner with RW and modA to maintain balance; further mobility limited by SpO2.  Stairs            Wheelchair Mobility    Modified Rankin (Stroke Patients Only)       Balance Overall balance assessment: Needs assistance   Sitting balance-Leahy Scale: Fair Sitting balance - Comments: Initial minA to maintain sitting balance, pt able to reposition well and maintain at supervision-level     Standing balance-Leahy Scale: Poor Standing balance comment: Reliant on UE support and external assist; dependent for posterior pericare                             Pertinent Vitals/Pain Pain Assessment: No/denies pain    Home Living Family/patient expects to be discharged to:: Private residence Living Arrangements: Spouse/significant other Available Help at Discharge: Family;Available 24 hours/day Type of Home: House       Home Layout: One level Home Equipment: Cane - single point Additional Comments: Reports wife indep and "in good health since she's only 27"    Prior Function Level of Independence: Independent with assistive device(s)  Comments: Reports mod indep with intermittent use of SPC     Hand Dominance        Extremity/Trunk Assessment   Upper Extremity Assessment Upper Extremity Assessment: Generalized weakness    Lower Extremity Assessment Lower Extremity Assessment: Generalized weakness    Cervical / Trunk Assessment Cervical / Trunk Assessment: Kyphotic  Communication   Communication: HOH  Cognition  Arousal/Alertness: Awake/alert Behavior During Therapy: WFL for tasks assessed/performed Overall Cognitive Status: No family/caregiver present to determine baseline cognitive functioning                                 General Comments: Following simple commands appropriately, sometimes with increased time. Did not ask orientation questions beyond name and did not ask many questions in general due to pt needing to focus on breathing entire session; needed frequent cues to breathe through nose      General Comments General comments (skin integrity, edema, etc.): Pt with SpO2 down to 60s with mobility on 35L HHFNC 90% FiO2. RN increased to 50L [place] 100% FiO2 to aide recovery, pt eventually requiring 15L NRB in addition to this to maintain SpO2 upper 80s    Exercises     Assessment/Plan    PT Assessment Patient needs continued PT services  PT Problem List Decreased strength;Decreased activity tolerance;Decreased balance;Decreased mobility;Cardiopulmonary status limiting activity;Decreased knowledge of use of DME       PT Treatment Interventions DME instruction;Gait training;Stair training;Functional mobility training;Therapeutic activities;Therapeutic exercise;Balance training;Patient/family education    PT Goals (Current goals can be found in the Care Plan section)  Acute Rehab PT Goals Patient Stated Goal: Get home PT Goal Formulation: With patient Time For Goal Achievement: 10/12/19 Potential to Achieve Goals: Fair    Frequency Min 2X/week   Barriers to discharge        Co-evaluation               AM-PAC PT "6 Clicks" Mobility  Outcome Measure Help needed turning from your back to your side while in a flat bed without using bedrails?: A Lot Help needed moving from lying on your back to sitting on the side of a flat bed without using bedrails?: A Lot Help needed moving to and from a bed to a chair (including a wheelchair)?: A Lot Help needed standing up  from a chair using your arms (e.g., wheelchair or bedside chair)?: A Lot Help needed to walk in hospital room?: A Lot Help needed climbing 3-5 steps with a railing? : Total 6 Click Score: 11    End of Session Equipment Utilized During Treatment: Oxygen Activity Tolerance: Patient tolerated treatment well;Treatment limited secondary to medical complications (Comment) Patient left: in chair;with call bell/phone within reach;with nursing/sitter in room(chair alarm under pt but no box; RN in room and states that's ok, she will ask charge nurse for another box) Nurse Communication: Mobility status PT Visit Diagnosis: Other abnormalities of gait and mobility (R26.89);Muscle weakness (generalized) (M62.81)    Time: [id number] PT Time Calculation (min) (ACUTE ONLY): 35 min   Charges:   PT Evaluation $PT Eval Moderate Complexity: 1 Mod PT Treatments $Therapeutic Activity: 8-22 mins   Mabeline Caras, PT, DPT Acute Rehabilitation Services  Pager [phone] Office [phone]  Derry Lory 09/28/2019, 4:38 PM

## 2019-09-28 NOTE — Progress Notes (Signed)
Sitting up in chair on Heated High Flow [place] 40L and 100% w/ sats 93%. Face Time initiated w/ daughter and updates provided and all questions answered. Call light in reach. Continuing to monitor.

## 2019-09-28 NOTE — Progress Notes (Signed)
Inpatient Diabetes Program Recommendations  AACE/ADA: New Consensus Statement on Inpatient Glycemic Control (2015)  Target Ranges:  Prepandial:   less than 140 mg/dL      Peak postprandial:   less than 180 mg/dL (1-2 hours)      Critically ill patients:  140 - 180 mg/dL   Lab Results  Component Value Date   GLUCAP 251 (H) 09/28/2019   HGBA1C 8.4 (H) 09/25/2019    Review of Glycemic Control Results for Robert Cuevas, Robert Cuevas (MRN [id number]) as of 09/28/2019 15:23  Ref. Range 09/27/2019 11:13 09/27/2019 16:00 09/27/2019 21:51 09/28/2019 08:00 09/28/2019 12:09  Glucose-Capillary Latest Ref Range: 70 - 99 mg/dL 234 (H) 276 (H) 283 (H) 171 (H) 251 (H)  Diabetes history: DM2 Outpatient Diabetes medications: Metformin 500 mg bid Current orders for Inpatient glycemic control: Levmir 6 units qd + Novolog sensitive correction tid, Novolog 2 units tid with meals  Inpatient Diabetes Program Recommendations:    Consider increasing Levemir to 5 units bid and increase Novolog meal coverage to 3 units tid with meals (while on Decadron).   Thanks,  Adah Perl, RN, BC-ADM Inpatient Diabetes Coordinator Pager [phone] (8a-5p)

## 2019-09-28 NOTE — Progress Notes (Signed)
PROGRESS NOTE    Robert Cuevas  [id number] DOB: [birth year] DOA: 09/25/2019 [facility]CP[facility]   Brief Narrative:  85 y.o.malewith medical history significant forcomplete heart block with pacer, hypertension, paroxysmal atrial fibrillation on Eliquis, chronic hypoxic respiratory failure, and non-insulin-dependent diabetes mellitus, diagnosed with COVID-19 on 09/21/2019 and now presenting with progressive shortness of breath.Patient was seen in the emergency department on 09/21/2019 to request COVID-19 testing because his wife was positive for the virus though the patient denies any symptoms at that time. He since developed progressive shortness of breath and went to [facility] ED for evaluation, patient noted to be profoundly hypoxic, requiring BiPAP 100% at [facility] ED, he was transferred to [facility] for further management   Subjective: Last 24 hours afebrile, A/O x4, negative abdominal pain, negative N/V.  Positive S OB.  States wife positive for Covid at home.   Assessment & Plan:   Principal Problem:   Acute on chronic respiratory failure with hypoxia (HCC) Active Problems:   Complete heart block (HCC)   Paroxysmal atrial fibrillation ([place])   COVID-19 virus infection   Diabetes mellitus type II, non insulin dependent (HCC)   Acute respiratory failure due to COVID-19 [facility])   Hyperbilirubinemia   Pressure injury of skin   COVID-19 pneumonia; acute on chronic hypoxic respiratory failure COVID-19 Labs  Recent Labs    09/26/19 0437 09/27/19 0251 09/28/19 0502  DDIMER 1.89* 2.98* 5.80*  CRP 9.1* 5.3* 3.2*    12/2 POC SARS coronavirus positive  -Presents with progressive SOB after testing positive for COVID-19 in [facility] ED on 12/2, found to have CXR findings concerning for multifocal PNA, increased supplemental O2 requirement, and procalcitonin 0.41  -Patient remains with significant oxygen requirement, he is currently on heated high flow 30 L nasal cannula.  -Received convalescent plasma 12/6. -Continue with IV steroids. -Received Actemra 12/6. -Continue with IV remdesivir. -Continue to trend inflammatory markers closely given significantly elevated on presentation. -Continue Rocephin, azithromycin, Decadron, and remdesivir   Paroxysmal atrial fibrillation -In rate-controlled atrial flutter in ED -CHADS-VASc is at least 80 (age x2, HTN, DM) -Continue Eliquis, continue metoprolol as tolerated  Diabetes type 2 uncontrolled with complication -No recent [id number] available -Managed with metformin only at home, held on admission -Start low-intensity SSI with Novolog and adjust as needed  Hypertension -BP at goal, continue metoprolol and ramipril as tolerated  Hyperbilirubinemia -Total bilirubin is 3.2 in ED with normal transaminases and benign abdominal exam -Fractionate bilirubin, check INR    DVT prophylaxis: Eliquis Code Status: DNR Family Communication:  Disposition Plan: TBD   Consultants:    Procedures/Significant Events:     I have personally reviewed and interpreted all radiology studies and my findings are as above.  VENTILATOR SETTINGS: HFNC 12/9 Flow rate; 35 L/min FiO2; 90% SPO2; 90%   Cultures     Antimicrobials: Anti-infectives (From admission, onward)   Start     Dose/Rate Stop   09/26/19 1000  remdesivir 100 mg in sodium chloride 0.9 % 100 mL IVPB  Status:  Discontinued     100 mg 200 mL/hr over 30 Minutes 09/25/19 0406   09/25/19 1100  azithromycin (ZITHROMAX) 500 mg in sodium chloride 0.9 % 250 mL IVPB     500 mg 250 mL/hr over 60 Minutes 09/28/19 1933   09/25/19 1000  remdesivir 100 mg in sodium chloride 0.9 % 100 mL IVPB     100 mg 200 mL/hr over 30 Minutes 09/28/19 1933   09/25/19 1000  cefTRIAXone (ROCEPHIN) 2  g in sodium chloride 0.9 % 100 mL IVPB     2 g 200 mL/hr over 30 Minutes 09/28/19 1933   09/25/19 0400  remdesivir 200 mg in sodium chloride 0.9% 250 mL  IVPB  Status:  Discontinued     200 mg 580 mL/hr over 30 Minutes 09/25/19 0406       Devices    LINES / TUBES:      Continuous Infusions: . sodium chloride 10 mL/hr at 09/26/19 0900     Objective: Vitals:   09/28/19 0900 09/28/19 1000 09/28/19 1100 09/28/19 1200  BP: (!) 149/69 131/61 (!) 142/68 (!) 149/66  Pulse: 70 70 70 70  Resp: 20 20 20 20   Temp:   97.8 F (36.6 C)   TempSrc:   Oral   SpO2: (!) 87% (!) 83% (!) 89% 90%  Weight:      Height:        Intake/Output Summary (Last 24 hours) at 09/28/2019 1319 Last data filed at 09/28/2019 1206 Gross per 24 hour  Intake 1220 ml  Output 1225 ml  Net -5 ml   Filed Weights   09/25/19 0500  Weight: 60.8 kg    Examination:  General: A/O x4, positive acute respiratory distress, cachectic Eyes: negative scleral hemorrhage, negative anisocoria, negative icterus ENT: Negative Runny nose, negative gingival bleeding, Neck:  Negative scars, masses, torticollis, lymphadenopathy, JVD Lungs: Diffuse decreased breath sounds, without wheezes or crackles Cardiovascular: Paced rhythm without murmur gallop or rub normal S1 and S2 Abdomen: negative abdominal pain, nondistended, positive soft, bowel sounds, no rebound, no ascites, no appreciable mass Extremities: No significant cyanosis, clubbing, or edema bilateral lower extremities Skin: Negative rashes, lesions, ulcers Psychiatric:  Negative depression, negative anxiety, negative fatigue, negative mania  Central nervous system:  Cranial nerves II through XII intact, tongue/uvula midline, all extremities muscle strength 5/5, sensation intact throughout, finger nose finger bilateral within normal limits, quick finger touch bilateral within normal limits, negative Romberg sign, heel to shin bilateral within normal limits, standing on 1 foot bilateral within normal limits, walking on tiptoes within normal limits, walking on heels within normal limits, negative dysarthria, negative  expressive aphasia, negative receptive aphasia.  .     Data Reviewed: Care during the described time interval was provided by me .  I have reviewed this patient's available data, including medical history, events of note, physical examination, and all test results as part of my evaluation.   CBC: Recent Labs  Lab 09/25/19 0452 09/26/19 0437 09/27/19 0251 09/28/19 0502  WBC 12.5* 10.5 10.3 10.1  NEUTROABS 11.2* 9.3* 9.3* 8.9*  HGB 16.3 15.4 15.8 17.8*  HCT 48.3 46.5 48.4 54.5*  MCV 94.5 95.3 95.3 97.7  PLT 221 220 207 [id number]   Basic Metabolic Panel: Recent Labs  Lab 09/25/19 0452 09/26/19 0437 09/27/19 0251 09/28/19 0502  NA 137 139 138 140  K 4.3 4.3 4.0 4.0  CL 92* 96* 99 98  CO2 30 29 27 31   GLUCOSE 223* 214* 163* 189*  BUN 32* 34* 36* 33*  CREATININE 0.87 0.76 0.68 0.60*  CALCIUM 8.7* 8.8* 8.4* 8.7*  MG 1.8 1.9 1.9 2.0  PHOS 4.8* 3.3 2.8 2.5   GFR: Estimated Creatinine Clearance: 55.9 mL/min (A) (by C-G formula based on SCr of 0.6 mg/dL (L)). Liver Function Tests: Recent Labs  Lab 09/25/19 0452 09/26/19 0437 09/27/19 0251 09/28/19 0502  AST 32 32 41 53*  ALT 22 20 24 30   ALKPHOS 154* 141* 140* 150*  BILITOT 1.8*  2.3* 1.7* 1.5* 2.3*  PROT 6.7 6.4* 6.2* 6.8  ALBUMIN 2.8* 2.5* 2.6* 3.0*   No results for input(s): LIPASE, AMYLASE in the last 168 hours. No results for input(s): AMMONIA in the last 168 hours. Coagulation Profile: No results for input(s): INR, PROTIME in the last 168 hours. Cardiac Enzymes: No results for input(s): CKTOTAL, CKMB, CKMBINDEX, TROPONINI in the last 168 hours. BNP (last 3 results) No results for input(s): PROBNP in the last 8760 hours. HbA1C: No results for input(s): HGBA1C in the last 72 hours. CBG: Recent Labs  Lab 09/27/19 1113 09/27/19 1600 09/27/19 2151 09/28/19 0800 09/28/19 1209  GLUCAP 234* 276* 283* 171* 251*   Lipid Profile: No results for input(s): CHOL, HDL, LDLCALC, TRIG, CHOLHDL, LDLDIRECT in the last  72 hours. Thyroid Function Tests: No results for input(s): TSH, T4TOTAL, FREET4, T3FREE, THYROIDAB in the last 72 hours. Anemia Panel: No results for input(s): VITAMINB12, FOLATE, FERRITIN, TIBC, IRON, RETICCTPCT in the last 72 hours. Urine analysis: No results found for: COLORURINE, APPEARANCEUR, LABSPEC, PHURINE, GLUCOSEU, HGBUR, BILIRUBINUR, KETONESUR, PROTEINUR, UROBILINOGEN, NITRITE, LEUKOCYTESUR Sepsis Labs: @LABRCNTIP (procalcitonin:4,lacticidven:4)  ) Recent Results (from the past 240 hour(s))  MRSA PCR Screening     Status: None   Collection Time: 09/25/19  4:33 AM   Specimen: Nasal Mucosa; Nasopharyngeal  Result Value Ref Range Status   MRSA by PCR NEGATIVE NEGATIVE Final    Comment:        The GeneXpert MRSA Assay (FDA approved for NASAL specimens only), is one component of a comprehensive MRSA colonization surveillance program. It is not intended to diagnose MRSA infection nor to guide or monitor treatment for MRSA infections. Performed at [facility], [place] [address] [place], [place] [address]          Radiology Studies: No results found.      Scheduled Meds: . apixaban  5 mg Oral BID  . atorvastatin  20 mg Oral q1800  . Chlorhexidine Gluconate Cloth  6 each Topical Daily  . dexamethasone (DECADRON) injection  6 mg Intravenous Q24H  . famotidine  20 mg Oral BID  . feeding supplement (ENSURE ENLIVE)  237 mL Oral TID BM  . insulin aspart  0-9 Units Subcutaneous TID WC  . insulin aspart  2 Units Subcutaneous TID WC  . insulin detemir  6 Units Subcutaneous Daily  . mouth rinse  15 mL Mouth Rinse BID  . metoprolol tartrate  50 mg Oral BID  . ramipril  2.5 mg Oral Daily  . sodium chloride flush  3 mL Intravenous Q12H  . sodium chloride flush  3 mL Intravenous Q12H   Continuous Infusions: . sodium chloride 10 mL/hr at 09/26/19 0900     LOS: 3 days   The patient is critically ill with multiple organ systems failure and requires  high complexity decision making for assessment and support, frequent evaluation and titration of therapies, application of advanced monitoring technologies and extensive interpretation of multiple databases. Critical Care Time devoted to patient care services described in this note  Time spent: 40 minutes     Maicol Bowland, Geraldo Docker, MD [facility] Pager [phone]  If 7PM-7AM, please contact night-coverage [email or web address] Password [email or web address] 09/28/2019, 1:19 PM

## 2019-09-29 ENCOUNTER — Inpatient Hospital Stay (HOSPITAL_COMMUNITY): Payer: No Typology Code available for payment source

## 2019-09-29 ENCOUNTER — Other Ambulatory Visit: Payer: Self-pay

## 2019-09-29 DIAGNOSIS — E118 Type 2 diabetes mellitus with unspecified complications: Secondary | ICD-10-CM | POA: Diagnosis present

## 2019-09-29 DIAGNOSIS — I10 Essential (primary) hypertension: Secondary | ICD-10-CM

## 2019-09-29 DIAGNOSIS — E785 Hyperlipidemia, unspecified: Secondary | ICD-10-CM | POA: Diagnosis present

## 2019-09-29 LAB — CBC WITH DIFFERENTIAL/PLATELET
Abs Immature Granulocytes: 0.15 10*3/uL — ABNORMAL HIGH (ref 0.00–0.07)
Basophils Absolute: 0 10*3/uL (ref 0.0–0.1)
Basophils Relative: 0 %
Eosinophils Absolute: 0 10*3/uL (ref 0.0–0.5)
Eosinophils Relative: 0 %
HCT: 50.6 % (ref 39.0–52.0)
Hemoglobin: 16.8 g/dL (ref 13.0–17.0)
Immature Granulocytes: 2 %
Lymphocytes Relative: 5 %
Lymphs Abs: 0.5 10*3/uL — ABNORMAL LOW (ref 0.7–4.0)
MCH: 31.6 pg (ref 26.0–34.0)
MCHC: 33.2 g/dL (ref 30.0–36.0)
MCV: 95.3 fL (ref 80.0–100.0)
Monocytes Absolute: 0.2 10*3/uL (ref 0.1–1.0)
Monocytes Relative: 2 %
Neutro Abs: 9 10*3/uL — ABNORMAL HIGH (ref 1.7–7.7)
Neutrophils Relative %: 91 %
Platelets: 168 10*3/uL (ref 150–400)
RBC: 5.31 MIL/uL (ref 4.22–5.81)
RDW: 14.4 % (ref 11.5–15.5)
WBC: 10 10*3/uL (ref 4.0–10.5)
nRBC: 0 % (ref 0.0–0.2)

## 2019-09-29 LAB — PHOSPHORUS: Phosphorus: 2.6 mg/dL (ref 2.5–4.6)

## 2019-09-29 LAB — COMPREHENSIVE METABOLIC PANEL
ALT: 28 U/L (ref 0–44)
AST: 50 U/L — ABNORMAL HIGH (ref 15–41)
Albumin: 2.5 g/dL — ABNORMAL LOW (ref 3.5–5.0)
Alkaline Phosphatase: 130 U/L — ABNORMAL HIGH (ref 38–126)
Anion gap: 12 (ref 5–15)
BUN: 28 mg/dL — ABNORMAL HIGH (ref 8–23)
CO2: 24 mmol/L (ref 22–32)
Calcium: 8.5 mg/dL — ABNORMAL LOW (ref 8.9–10.3)
Chloride: 102 mmol/L (ref 98–111)
Creatinine, Ser: 0.59 mg/dL — ABNORMAL LOW (ref 0.61–1.24)
GFR calc Af Amer: >60 mL/min (ref >60)
GFR calc non Af Amer: >60 mL/min (ref >60)
Glucose, Bld: 91 mg/dL (ref 70–99)
Potassium: 4.4 mmol/L (ref 3.5–5.1)
Sodium: 138 mmol/L (ref 135–145)
Total Bilirubin: 1.8 mg/dL — ABNORMAL HIGH (ref 0.3–1.2)
Total Protein: 5.8 g/dL — ABNORMAL LOW (ref 6.5–8.1)

## 2019-09-29 LAB — LIPID PANEL
Cholesterol: 35 mg/dL (ref 0–200)
HDL: 16 mg/dL — ABNORMAL LOW (ref >40)
LDL Cholesterol: 14 mg/dL (ref 0–99)
Total CHOL/HDL Ratio: 2.2 RATIO
Triglycerides: 24 mg/dL (ref <150)
VLDL: 5 mg/dL (ref 0–40)

## 2019-09-29 LAB — PROCALCITONIN: Procalcitonin: <0.1 ng/mL

## 2019-09-29 LAB — GLUCOSE, CAPILLARY
Glucose-Capillary: 116 mg/dL — ABNORMAL HIGH (ref 70–99)
Glucose-Capillary: 194 mg/dL — ABNORMAL HIGH (ref 70–99)
Glucose-Capillary: 240 mg/dL — ABNORMAL HIGH (ref 70–99)
Glucose-Capillary: 244 mg/dL — ABNORMAL HIGH (ref 70–99)

## 2019-09-29 LAB — D-DIMER, QUANTITATIVE: D-Dimer, Quant: 6.37 ug/mL-FEU — ABNORMAL HIGH (ref 0.00–0.50)

## 2019-09-29 LAB — MAGNESIUM: Magnesium: 1.8 mg/dL (ref 1.7–2.4)

## 2019-09-29 LAB — C-REACTIVE PROTEIN: CRP: 1.8 mg/dL — ABNORMAL HIGH (ref <1.0)

## 2019-09-29 MED ORDER — FUROSEMIDE 10 MG/ML IJ SOLN
60.0000 mg | Freq: Once | INTRAMUSCULAR | Status: AC
  Administered 2019-09-29: 60 mg via INTRAVENOUS
  Filled 2019-09-29: qty 6

## 2019-09-29 MED ORDER — ALBUMIN HUMAN 25 % IV SOLN
25.0000 g | Freq: Once | INTRAVENOUS | Status: AC
  Administered 2019-09-29: 25 g via INTRAVENOUS
  Filled 2019-09-29: qty 50
  Filled 2019-09-29: qty 100

## 2019-09-29 NOTE — Progress Notes (Signed)
Message sent through secure chat to MD Sherral Hammers about pt low SPO2 and O2 requirements.

## 2019-09-29 NOTE — Progress Notes (Addendum)
PROGRESS NOTE    Robert Cuevas  [id number] DOB: 1930/04/08 DOA: 09/25/2019 PCP: [facility]   Brief Narrative:  85 y.o.malewith medical history significant forcomplete heart block with pacer, hypertension, paroxysmal atrial fibrillation on Eliquis, chronic hypoxic respiratory failure, and non-insulin-dependent diabetes mellitus, diagnosed with COVID-19 on 09/21/2019 and now presenting with progressive shortness of breath.Patient was seen in the emergency department on 09/21/2019 to request COVID-19 testing because his wife was positive for the virus though the patient denies any symptoms at that time. He since developed progressive shortness of breath and went to [facility] ED for evaluation, patient noted to be profoundly hypoxic, requiring BiPAP 100% at [facility] ED, he was transferred to [facility] for further management   Subjective: 12/10 last 24 hours afebrile,/O x4, negative abdominal pain.  Negative N/V.  Positive S OB.  Sitting in the chair comfortable.   Assessment & Plan:   Principal Problem:   Acute on chronic respiratory failure with hypoxia (HCC) Active Problems:   Complete heart block (HCC)   Paroxysmal atrial fibrillation ([place])   COVID-19 virus infection   Diabetes mellitus type II, non insulin dependent (HCC)   Acute respiratory failure due to COVID-19 (HCC)   Hyperbilirubinemia   Pressure injury of skin   Diabetes mellitus type 2, uncontrolled, with complications ([place])   [id number] pneumonia; acute on chronic hypoxic respiratory failure COVID-19 Labs  Recent Labs    09/27/19 0251 09/28/19 0502 09/29/19 0012  DDIMER 2.98* 5.80* 6.37*  CRP 5.3* 3.2* 1.8*   12/2 POC SARS coronavirus positive -Decadron 6 mg daily -Remdesivir per pharmacy protocol -12/6 transfuse 1 unit Covid convalescent plasma -12/6 Actemra x1 dose -Complete course antibiotics for  CAP -12/10 increased WOB/O2 demand.  PCXR not consistent with worsening Covid pneumonia. -12/10  procalcitonin pending -12/10 Albumin 25 g + Lasix IV 60 mg  Paroxysmal atrial fibrillation -Currently paced; NSR -CHADS-VASc is at least 4 (age x2, HTN, DM) -Continue Eliquis 5 mg  BID  -Metoprolol 50 mg BID  Essential HTN -See A. fib  Diabetes type 2 uncontrolled with complication -[id number] hemoglobin A1c= 8.4 -On Metformin at home obviously not managing patient's diabetes.  Would need to increase dose to 1000 mg  BID prior to discharge if patient survives hospitalization  -Levemir 6 units daily -NovoLog 2 units TID -Sensitive SSI  Hyperbilirubinemia -Total bilirubin is 3.2 in ED with normal transaminases and benign abdominal exam -Improving continue to monitor    DVT prophylaxis: Eliquis Code Status: DNR Family Communication: 12/10 left message on Ruby (wife) phone that I had called to give her an update Disposition Plan: TBD   Consultants:    Procedures/Significant Events:  12/10 PCXR; Right worse than left airspace disease persists but has improved   I have personally reviewed and interpreted all radiology studies and my findings are as above.  VENTILATOR SETTINGS: HFNC+ NRB 12/10 Flow rate; 35 L/min FiO2; 100% SPO2; 83%   Cultures     Antimicrobials: Anti-infectives (From admission, onward)   Start     Dose/Rate Stop   09/26/19 1000  remdesivir 100 mg in sodium chloride 0.9 % 100 mL IVPB  Status:  Discontinued     100 mg 200 mL/hr over 30 Minutes 09/25/19 0406   09/25/19 1100  azithromycin (ZITHROMAX) 500 mg in sodium chloride 0.9 % 250 mL IVPB     500 mg 250 mL/hr over 60 Minutes 09/28/19 1933   09/25/19 1000  remdesivir 100 mg in sodium chloride 0.9 % 100 mL IVPB  100 mg 200 mL/hr over 30 Minutes 09/28/19 1933   09/25/19 1000  cefTRIAXone (ROCEPHIN) 2 g in sodium chloride 0.9 % 100 mL IVPB     2 g 200 mL/hr over 30 Minutes 09/28/19 1933   09/25/19 0400  remdesivir 200 mg in sodium chloride 0.9% 250 mL IVPB  Status:  Discontinued      200 mg 580 mL/hr over 30 Minutes 09/25/19 0406       Devices    LINES / TUBES:      Continuous Infusions: . sodium chloride Stopped (09/28/19 1933)  . albumin human       Objective: Vitals:   09/29/19 1128 09/29/19 1200 09/29/19 1300 09/29/19 1400  BP: (!) 114/54 129/62 128/68 132/64  Pulse: 70 71 70 69  Resp:  (!) 27 (!) 23 (!) 23  Temp:  (!) 97.2 F (36.2 C)    TempSrc:  Axillary    SpO2: (!) 83% (!) 82% (!) 87% (!) 87%  Weight:      Height:        Intake/Output Summary (Last 24 hours) at 09/29/2019 1535 Last data filed at 09/29/2019 1400 Gross per 24 hour  Intake 523 ml  Output 1050 ml  Net -527 ml   Filed Weights   09/25/19 0500  Weight: 60.8 kg   Physical Exam:  General: A/O x4, positive acute respiratory distress Eyes: negative scleral hemorrhage, negative anisocoria, negative icterus ENT: Negative Runny nose, negative gingival bleeding, Neck:  Negative scars, masses, torticollis, lymphadenopathy, JVD Lungs: Tachypnea, diffuse decreased breath sounds RLL >> remainder lung fields, without wheezes or crackles Cardiovascular: Paced rhythm without murmur gallop or rub normal S1 and S2 pacer right chest wall Abdomen: negative abdominal pain, nondistended, positive soft, bowel sounds, no rebound, no ascites, no appreciable mass Extremities: No significant cyanosis, clubbing, or edema bilateral lower extremities Skin: Negative rashes, lesions, ulcers Psychiatric:  Negative depression, negative anxiety, negative fatigue, negative mania  Central nervous system:  Cranial nerves II through XII intact, tongue/uvula midline, all extremities muscle strength 5/5, sensation intact throughout,  negative dysarthria, negative expressive aphasia, negative receptive aphasia.  .     Data Reviewed: Care during the described time interval was provided by me .  I have reviewed this patient's available data, including medical history, events of note, physical examination,  and all test results as part of my evaluation.   CBC: Recent Labs  Lab 09/25/19 0452 09/26/19 0437 09/27/19 0251 09/28/19 0502 09/29/19 0012  WBC 12.5* 10.5 10.3 10.1 10.0  NEUTROABS 11.2* 9.3* 9.3* 8.9* 9.0*  HGB 16.3 15.4 15.8 17.8* 16.8  HCT 48.3 46.5 48.4 54.5* 50.6  MCV 94.5 95.3 95.3 97.7 95.3  PLT 221 220 207 192 [id number]   Basic Metabolic Panel: Recent Labs  Lab 09/25/19 0452 09/26/19 0437 09/27/19 0251 09/28/19 0502 09/29/19 0012  NA 137 139 138 140 138  K 4.3 4.3 4.0 4.0 4.4  CL 92* 96* 99 98 102  CO2 30 29 27 31 24   GLUCOSE 223* 214* 163* 189* 91  BUN 32* 34* 36* 33* 28*  CREATININE 0.87 0.76 0.68 0.60* 0.59*  CALCIUM 8.7* 8.8* 8.4* 8.7* 8.5*  MG 1.8 1.9 1.9 2.0 1.8  PHOS 4.8* 3.3 2.8 2.5 2.6   GFR: Estimated Creatinine Clearance: 55.9 mL/min (A) (by C-G formula based on SCr of 0.59 mg/dL (L)). Liver Function Tests: Recent Labs  Lab 09/25/19 0452 09/26/19 0437 09/27/19 0251 09/28/19 0502 09/29/19 0012  AST 32 32 41 53* 50*  ALT  22 20 24 30 28   ALKPHOS 154* 141* 140* 150* 130*  BILITOT 1.8*  2.3* 1.7* 1.5* 2.3* 1.8*  PROT 6.7 6.4* 6.2* 6.8 5.8*  ALBUMIN 2.8* 2.5* 2.6* 3.0* 2.5*   No results for input(s): LIPASE, AMYLASE in the last 168 hours. No results for input(s): AMMONIA in the last 168 hours. Coagulation Profile: No results for input(s): INR, PROTIME in the last 168 hours. Cardiac Enzymes: No results for input(s): CKTOTAL, CKMB, CKMBINDEX, TROPONINI in the last 168 hours. BNP (last 3 results) No results for input(s): PROBNP in the last 8760 hours. HbA1C: No results for input(s): HGBA1C in the last 72 hours. CBG: Recent Labs  Lab 09/28/19 1209 09/28/19 1639 09/28/19 2030 09/29/19 0731 09/29/19 1137  GLUCAP 251* 186* 194* 116* 194*   Lipid Profile: Recent Labs    09/29/19 0012  CHOL 35  HDL 16*  LDLCALC 14  TRIG 24  CHOLHDL 2.2   Thyroid Function Tests: No results for input(s): TSH, T4TOTAL, FREET4, T3FREE, THYROIDAB in the  last 72 hours. Anemia Panel: No results for input(s): VITAMINB12, FOLATE, FERRITIN, TIBC, IRON, RETICCTPCT in the last 72 hours. Urine analysis: No results found for: COLORURINE, APPEARANCEUR, LABSPEC, PHURINE, GLUCOSEU, HGBUR, BILIRUBINUR, KETONESUR, PROTEINUR, UROBILINOGEN, NITRITE, LEUKOCYTESUR Sepsis Labs: @LABRCNTIP (procalcitonin:4,lacticidven:4)  ) Recent Results (from the past 240 hour(s))  MRSA PCR Screening     Status: None   Collection Time: 09/25/19  4:33 AM   Specimen: Nasal Mucosa; Nasopharyngeal  Result Value Ref Range Status   MRSA by PCR NEGATIVE NEGATIVE Final    Comment:        The GeneXpert MRSA Assay (FDA approved for NASAL specimens only), is one component of a comprehensive MRSA colonization surveillance program. It is not intended to diagnose MRSA infection nor to guide or monitor treatment for MRSA infections. Performed at [facility], [place] [address], [place], [place] [address]          Radiology Studies: DG CHEST PORT 1 VIEW  Result Date: 09/29/2019 CLINICAL DATA:  COVID-19 pneumonia. EXAM: PORTABLE CHEST 1 VIEW COMPARISON:  Single view of the chest 09/25/2019. FINDINGS: Right worse than left airspace disease persists. Aeration in the lower lung zones has improved compared to the prior study. No pneumothorax or pleural effusion. Heart size is upper normal. Atherosclerosis and pacing device noted. IMPRESSION: Right worse than left airspace disease persists but has improved. Electronically Signed   By: Inge Rise M.D.   On: 09/29/2019 12:49        Scheduled Meds: . apixaban  5 mg Oral BID  . atorvastatin  20 mg Oral q1800  . Chlorhexidine Gluconate Cloth  6 each Topical Daily  . dexamethasone (DECADRON) injection  6 mg Intravenous Q24H  . famotidine  20 mg Oral BID  . feeding supplement (ENSURE ENLIVE)  237 mL Oral TID BM  . furosemide  60 mg Intravenous Once  . insulin aspart  0-9 Units Subcutaneous TID WC  .  insulin aspart  2 Units Subcutaneous TID WC  . insulin detemir  6 Units Subcutaneous Daily  . mouth rinse  15 mL Mouth Rinse BID  . metoprolol tartrate  50 mg Oral BID  . pneumococcal 23 valent vaccine  0.5 mL Intramuscular Tomorrow-1000  . ramipril  2.5 mg Oral Daily  . sodium chloride flush  3 mL Intravenous Q12H  . sodium chloride flush  3 mL Intravenous Q12H   Continuous Infusions: . sodium chloride Stopped (09/28/19 1933)  . albumin human  LOS: 4 days   The patient is critically ill with multiple organ systems failure and requires high complexity decision making for assessment and support, frequent evaluation and titration of therapies, application of advanced monitoring technologies and extensive interpretation of multiple databases. Critical Care Time devoted to patient care services described in this note  Time spent: 40 minutes     Laterrance Nauta, Geraldo Docker, MD [facility] Pager [phone]  If 7PM-7AM, please contact night-coverage [email or web address] Password [email or web address] 09/29/2019, 3:35 PM

## 2019-09-29 NOTE — Progress Notes (Signed)
Pt SPO2 >94%, NRB removed and pt SPO2 remained 93-94%.  Messaged MD that NRB was removed and SPO2 stable.  Also informed MD that pt wife would like to speak to MD, MD stated ok.

## 2019-09-29 NOTE — Progress Notes (Signed)
Spoke with MD Sherral Hammers about increase in O2 demands, MD verbally ordered a chest xray, no other orders at this time. 09/29/2019 [place], RN

## 2019-09-29 NOTE — Plan of Care (Signed)
Pt updated on POC, pt verbalized understanding, forgetful at times so needs reinforcement of education

## 2019-09-29 NOTE — Progress Notes (Addendum)
Assisted pt with lunch, removed NRB to eat for apx 1-31mins, at 1415 SPO2 decreased to high 70's.  NRB back in place and SPO2 increased to 84-87% after apx 2-3 mins.  Notifed MD at 1430. 09/29/2019 [place], RN

## 2019-09-29 NOTE — Progress Notes (Signed)
Sent page to MD Sherral Hammers about pt SPO2 68-71, HHFNC increased to 55L with 100% and NRB placed, SPO2 now 80-83%.

## 2019-09-29 NOTE — Progress Notes (Signed)
Talked to the daughter Mrs. Dewaine Conger discussed his condition declining and need for more oxygen with no reserve and answered all questions at this time

## 2019-09-29 NOTE — Progress Notes (Signed)
Patient's daughter, Junius Argyle, updated via phone on patient status and POC. All questions answered at present time. Patient spoke with daughter on phone as well.

## 2019-09-30 LAB — CBC WITH DIFFERENTIAL/PLATELET
Abs Immature Granulocytes: 0.13 10*3/uL — ABNORMAL HIGH (ref 0.00–0.07)
Basophils Absolute: 0 10*3/uL (ref 0.0–0.1)
Basophils Relative: 0 %
Eosinophils Absolute: 0.1 10*3/uL (ref 0.0–0.5)
Eosinophils Relative: 1 %
HCT: 53.6 % — ABNORMAL HIGH (ref 39.0–52.0)
Hemoglobin: 17.8 g/dL — ABNORMAL HIGH (ref 13.0–17.0)
Immature Granulocytes: 1 %
Lymphocytes Relative: 4 %
Lymphs Abs: 0.5 10*3/uL — ABNORMAL LOW (ref 0.7–4.0)
MCH: 31.7 pg (ref 26.0–34.0)
MCHC: 33.2 g/dL (ref 30.0–36.0)
MCV: 95.5 fL (ref 80.0–100.0)
Monocytes Absolute: 0.2 10*3/uL (ref 0.1–1.0)
Monocytes Relative: 2 %
Neutro Abs: 10.6 10*3/uL — ABNORMAL HIGH (ref 1.7–7.7)
Neutrophils Relative %: 92 %
Platelets: 152 10*3/uL (ref 150–400)
RBC: 5.61 MIL/uL (ref 4.22–5.81)
RDW: 14.4 % (ref 11.5–15.5)
WBC: 11.5 10*3/uL — ABNORMAL HIGH (ref 4.0–10.5)
nRBC: 0 % (ref 0.0–0.2)

## 2019-09-30 LAB — COMPREHENSIVE METABOLIC PANEL
ALT: 36 U/L (ref 0–44)
AST: 61 U/L — ABNORMAL HIGH (ref 15–41)
Albumin: 3.2 g/dL — ABNORMAL LOW (ref 3.5–5.0)
Alkaline Phosphatase: 145 U/L — ABNORMAL HIGH (ref 38–126)
Anion gap: 16 — ABNORMAL HIGH (ref 5–15)
BUN: 32 mg/dL — ABNORMAL HIGH (ref 8–23)
CO2: 28 mmol/L (ref 22–32)
Calcium: 9.1 mg/dL (ref 8.9–10.3)
Chloride: 94 mmol/L — ABNORMAL LOW (ref 98–111)
Creatinine, Ser: 0.62 mg/dL (ref 0.61–1.24)
GFR calc Af Amer: >60 mL/min (ref >60)
GFR calc non Af Amer: >60 mL/min (ref >60)
Glucose, Bld: 102 mg/dL — ABNORMAL HIGH (ref 70–99)
Potassium: 3.9 mmol/L (ref 3.5–5.1)
Sodium: 138 mmol/L (ref 135–145)
Total Bilirubin: 3.1 mg/dL — ABNORMAL HIGH (ref 0.3–1.2)
Total Protein: 6.3 g/dL — ABNORMAL LOW (ref 6.5–8.1)

## 2019-09-30 LAB — FERRITIN: Ferritin: 349 ng/mL — ABNORMAL HIGH (ref 24–336)

## 2019-09-30 LAB — C-REACTIVE PROTEIN: CRP: 1.1 mg/dL — ABNORMAL HIGH (ref <1.0)

## 2019-09-30 LAB — MAGNESIUM: Magnesium: 1.8 mg/dL (ref 1.7–2.4)

## 2019-09-30 LAB — GLUCOSE, CAPILLARY
Glucose-Capillary: 115 mg/dL — ABNORMAL HIGH (ref 70–99)
Glucose-Capillary: 246 mg/dL — ABNORMAL HIGH (ref 70–99)
Glucose-Capillary: 227 mg/dL — ABNORMAL HIGH (ref 70–99)
Glucose-Capillary: 278 mg/dL — ABNORMAL HIGH (ref 70–99)

## 2019-09-30 LAB — D-DIMER, QUANTITATIVE: D-Dimer, Quant: 7.48 ug/mL-FEU — ABNORMAL HIGH (ref 0.00–0.50)

## 2019-09-30 LAB — PHOSPHORUS: Phosphorus: 3.4 mg/dL (ref 2.5–4.6)

## 2019-09-30 LAB — PROCALCITONIN: Procalcitonin: <0.1 ng/mL

## 2019-09-30 LAB — LACTIC ACID, PLASMA: Lactic Acid, Venous: 2.8 mmol/L (ref 0.5–1.9)

## 2019-09-30 NOTE — Progress Notes (Signed)
CRITICAL VALUE ALERT  Critical Value:  Lac 2.8  Date & Time Notied:  09/30/2019 @ [id number]  Provider Notified: Dr Dia Crawford  Orders Received/Actions taken:  None ordered/ MD follow

## 2019-09-30 NOTE — Progress Notes (Signed)
PROGRESS NOTE    Robert Cuevas  [id number] DOB: 10-26-1929 DOA: 09/25/2019 PCP: [facility]   Brief Narrative:  85 y.o.malewith medical history significant forcomplete heart block with pacer, hypertension, paroxysmal atrial fibrillation on Eliquis, chronic hypoxic respiratory failure, and non-insulin-dependent diabetes mellitus, diagnosed with COVID-19 on 09/21/2019 and now presenting with progressive shortness of breath.Patient was seen in the emergency department on 09/21/2019 to request COVID-19 testing because his wife was positive for the virus though the patient denies any symptoms at that time. He since developed progressive shortness of breath and went to [facility] ED for evaluation, patient noted to be profoundly hypoxic, requiring BiPAP 100% at [facility] ED, he was transferred to [facility] for further management   Subjective: 12/11 last 24 hours afebrile A/O x4, negative abdominal pain.  Sitting comfortably in chair eating lunch on HFNC.  Positive S OB   Assessment & Plan:   Principal Problem:   Acute on chronic respiratory failure with hypoxia (HCC) Active Problems:   Complete heart block (HCC)   Paroxysmal atrial fibrillation ([place])   COVID-19 virus infection   Diabetes mellitus type II, non insulin dependent (HCC)   Acute respiratory failure due to COVID-19 ([place])   Hyperbilirubinemia   Pressure injury of skin   Diabetes mellitus type 2, uncontrolled, with complications ([place])   [id number] pneumonia; acute on chronic hypoxic respiratory failure COVID-19 Labs  Recent Labs    09/28/19 0502 09/29/19 0012  DDIMER 5.80* 6.37*  CRP 3.2* 1.8*   12/2 POC SARS coronavirus positive -Decadron 6 mg daily -Remdesivir per pharmacy protocol -12/6 transfuse 1 unit Covid convalescent plasma -12/6 Actemra x1 dose -Complete course antibiotics for  CAP -12/10 increased WOB/O2 demand.  PCXR not consistent with worsening Covid pneumonia. -12/10 procalcitonin pending  -12/10 Albumin 25 g + Lasix IV 60 mg  Lactic acidosis -Most likely secondary to yesterday's diuresis. -Procalcitonin not indicative of infection, mild leukocytosis most likely secondary to steroids, negative bands or left shift. -Continue to monitor for secondary infection  Paroxysmal atrial fibrillation -Currently paced; NSR -CHADS-VASc is at least 4 (age x2, HTN, DM) -Continue Eliquis 5 mg  BID  -Metoprolol 50 mg BID  Essential HTN -See A. fib  Diabetes type 2 uncontrolled with complication -[id number] hemoglobin A1c= 8.4 -On Metformin at home obviously not managing patient's diabetes.  Would need to increase dose to 1000 mg  BID prior to discharge if patient survives hospitalization  -Levemir 6 units daily -NovoLog 2 units TID -Sensitive SSI  Hyperbilirubinemia -Total bilirubin is 3.2 in ED with normal transaminases and benign abdominal exam -Improving continue to monitor    DVT prophylaxis: Eliquis Code Status: DNR Family Communication: 12/10 left message on Ruby (wife) phone that I had called to give her an update Disposition Plan: TBD   Consultants:    Procedures/Significant Events:  12/10 PCXR; Right worse than left airspace disease persists but has improved   I have personally reviewed and interpreted all radiology studies and my findings are as above.  VENTILATOR SETTINGS: HFNC 12/11 Flow rate; 45 L/min FiO2; 100% SPO2; 88%   Cultures 12/2 POC SARS coronavirus positive    Antimicrobials: Anti-infectives (From admission, onward)   Start     Dose/Rate Stop   09/26/19 1000  remdesivir 100 mg in sodium chloride 0.9 % 100 mL IVPB  Status:  Discontinued     100 mg 200 mL/hr over 30 Minutes 09/25/19 0406   09/25/19 1100  azithromycin (ZITHROMAX) 500 mg in sodium chloride 0.9 % 250 mL  IVPB     500 mg 250 mL/hr over 60 Minutes 09/28/19 1933   09/25/19 1000  remdesivir 100 mg in sodium chloride 0.9 % 100 mL IVPB     100 mg 200 mL/hr over 30 Minutes  09/28/19 1933   09/25/19 1000  cefTRIAXone (ROCEPHIN) 2 g in sodium chloride 0.9 % 100 mL IVPB     2 g 200 mL/hr over 30 Minutes 09/28/19 1933   09/25/19 0400  remdesivir 200 mg in sodium chloride 0.9% 250 mL IVPB  Status:  Discontinued     200 mg 580 mL/hr over 30 Minutes 09/25/19 0406       Devices    LINES / TUBES:      Continuous Infusions: . sodium chloride Stopped (09/28/19 1933)     Objective: Vitals:   09/30/19 0500 09/30/19 0600 09/30/19 0700 09/30/19 0730  BP: 128/63 (!) 137/59 125/66   Pulse: 70 70 71 70  Resp: 17 20 17  (!) 21  Temp:   98.1 F (36.7 C)   TempSrc:   Oral   SpO2: (!) 89% (!) 89% (!) 88% (!) 87%  Weight:      Height:        Intake/Output Summary (Last 24 hours) at 09/30/2019 [id number] Last data filed at 09/30/2019 0730 Gross per 24 hour  Intake 944.33 ml  Output 2700 ml  Net -1755.67 ml   Filed Weights   09/25/19 0500  Weight: 60.8 kg   Physical Exam:  General: A/O x4, positive acute respiratory distress cachectic Eyes: negative scleral hemorrhage, negative anisocoria, negative icterus ENT: Negative Runny nose, negative gingival bleeding, Neck:  Negative scars, masses, torticollis, lymphadenopathy, JVD Lungs: Clear to auscultation bilaterally without wheezes or crackles Cardiovascular: Paced rhythm without murmur gallop or rub normal S1 and S2 Abdomen: negative abdominal pain, nondistended, positive soft, bowel sounds, no rebound, no ascites, no appreciable mass Extremities: No significant cyanosis, clubbing, or edema bilateral lower extremities Skin: Negative rashes, lesions, ulcers Psychiatric:  Negative depression, negative anxiety, negative fatigue, negative mania  Central nervous system:  Cranial nerves II through XII intact, tongue/uvula midline, all extremities muscle strength 5/5, sensation intact throughout, negative dysarthria, negative expressive aphasia, negative receptive aphasia.  .     Data Reviewed: Care during the  described time interval was provided by me .  I have reviewed this patient's available data, including medical history, events of note, physical examination, and all test results as part of my evaluation.   CBC: Recent Labs  Lab 09/25/19 0452 09/26/19 0437 09/27/19 0251 09/28/19 0502 09/29/19 0012  WBC 12.5* 10.5 10.3 10.1 10.0  NEUTROABS 11.2* 9.3* 9.3* 8.9* 9.0*  HGB 16.3 15.4 15.8 17.8* 16.8  HCT 48.3 46.5 48.4 54.5* 50.6  MCV 94.5 95.3 95.3 97.7 95.3  PLT 221 220 207 192 [id number]   Basic Metabolic Panel: Recent Labs  Lab 09/25/19 0452 09/26/19 0437 09/27/19 0251 09/28/19 0502 09/29/19 0012  NA 137 139 138 140 138  K 4.3 4.3 4.0 4.0 4.4  CL 92* 96* 99 98 102  CO2 30 29 27 31 24   GLUCOSE 223* 214* 163* 189* 91  BUN 32* 34* 36* 33* 28*  CREATININE 0.87 0.76 0.68 0.60* 0.59*  CALCIUM 8.7* 8.8* 8.4* 8.7* 8.5*  MG 1.8 1.9 1.9 2.0 1.8  PHOS 4.8* 3.3 2.8 2.5 2.6   GFR: Estimated Creatinine Clearance: 55.9 mL/min (A) (by C-G formula based on SCr of 0.59 mg/dL (L)). Liver Function Tests: Recent Labs  Lab 09/25/19 [phone] 09/26/19 [phone]  09/27/19 0251 09/28/19 0502 09/29/19 0012  AST 32 32 41 53* 50*  ALT 22 20 24 30 28   ALKPHOS 154* 141* 140* 150* 130*  BILITOT 1.8*  2.3* 1.7* 1.5* 2.3* 1.8*  PROT 6.7 6.4* 6.2* 6.8 5.8*  ALBUMIN 2.8* 2.5* 2.6* 3.0* 2.5*   No results for input(s): LIPASE, AMYLASE in the last 168 hours. No results for input(s): AMMONIA in the last 168 hours. Coagulation Profile: No results for input(s): INR, PROTIME in the last 168 hours. Cardiac Enzymes: No results for input(s): CKTOTAL, CKMB, CKMBINDEX, TROPONINI in the last 168 hours. BNP (last 3 results) No results for input(s): PROBNP in the last 8760 hours. HbA1C: No results for input(s): HGBA1C in the last 72 hours. CBG: Recent Labs  Lab 09/29/19 0731 09/29/19 1137 09/29/19 1633 09/29/19 1948 09/30/19 0706  GLUCAP 116* 194* 240* 244* 115*   Lipid Profile: Recent Labs    09/29/19 0012   CHOL 35  HDL 16*  LDLCALC 14  TRIG 24  CHOLHDL 2.2   Thyroid Function Tests: No results for input(s): TSH, T4TOTAL, FREET4, T3FREE, THYROIDAB in the last 72 hours. Anemia Panel: No results for input(s): VITAMINB12, FOLATE, FERRITIN, TIBC, IRON, RETICCTPCT in the last 72 hours. Urine analysis: No results found for: COLORURINE, APPEARANCEUR, LABSPEC, PHURINE, GLUCOSEU, HGBUR, BILIRUBINUR, KETONESUR, PROTEINUR, UROBILINOGEN, NITRITE, LEUKOCYTESUR Sepsis Labs: @LABRCNTIP (procalcitonin:4,lacticidven:4)  ) Recent Results (from the past 240 hour(s))  MRSA PCR Screening     Status: None   Collection Time: 09/25/19  4:33 AM   Specimen: Nasal Mucosa; Nasopharyngeal  Result Value Ref Range Status   MRSA by PCR NEGATIVE NEGATIVE Final    Comment:        The GeneXpert MRSA Assay (FDA approved for NASAL specimens only), is one component of a comprehensive MRSA colonization surveillance program. It is not intended to diagnose MRSA infection nor to guide or monitor treatment for MRSA infections. Performed at [facility], [place] [address], [place], [place] [address]          Radiology Studies: DG CHEST PORT 1 VIEW  Result Date: 09/29/2019 CLINICAL DATA:  COVID-19 pneumonia. EXAM: PORTABLE CHEST 1 VIEW COMPARISON:  Single view of the chest 09/25/2019. FINDINGS: Right worse than left airspace disease persists. Aeration in the lower lung zones has improved compared to the prior study. No pneumothorax or pleural effusion. Heart size is upper normal. Atherosclerosis and pacing device noted. IMPRESSION: Right worse than left airspace disease persists but has improved. Electronically Signed   By: Inge Rise M.D.   On: 09/29/2019 12:49        Scheduled Meds: . apixaban  5 mg Oral BID  . atorvastatin  20 mg Oral q1800  . Chlorhexidine Gluconate Cloth  6 each Topical Daily  . dexamethasone (DECADRON) injection  6 mg Intravenous Q24H  . famotidine  20 mg Oral BID   . feeding supplement (ENSURE ENLIVE)  237 mL Oral TID BM  . insulin aspart  0-9 Units Subcutaneous TID WC  . insulin aspart  2 Units Subcutaneous TID WC  . insulin detemir  6 Units Subcutaneous Daily  . mouth rinse  15 mL Mouth Rinse BID  . metoprolol tartrate  50 mg Oral BID  . pneumococcal 23 valent vaccine  0.5 mL Intramuscular Tomorrow-1000  . ramipril  2.5 mg Oral Daily  . sodium chloride flush  3 mL Intravenous Q12H  . sodium chloride flush  3 mL Intravenous Q12H   Continuous Infusions: . sodium chloride Stopped (09/28/19  1933)     LOS: 5 days   The patient is critically ill with multiple organ systems failure and requires high complexity decision making for assessment and support, frequent evaluation and titration of therapies, application of advanced monitoring technologies and extensive interpretation of multiple databases. Critical Care Time devoted to patient care services described in this note  Time spent: 40 minutes     Robert Cuevas, Geraldo Docker, MD [facility] Pager [phone]  If 7PM-7AM, please contact night-coverage [email or web address] Password [facility] 09/30/2019, 8:39 AM

## 2019-09-30 NOTE — Evaluation (Addendum)
Occupational Therapy Evaluation Patient Details Name: Robert Cuevas MRN: [id number] DOB: 02-15-1930 Today's Date: 09/30/2019    History of Present Illness Pt is an 85 y.o. male admitted 09/25/19 with worsening SOB; initially tested (+) COVID-19 on 09/21/19. PMH includes afib, DM2, HTN.   Clinical Impression   This 85 y/o male presents with the above. PTA pt reports independence with ADL and functional mobility. Pt currently presenting with overall weakness, decreased activity tolerance, impaired standing balance and cognition. Pt currently requiring two person assist for functional transfers, tolerating OOB to recliner during this session. He currently requires minA for seated UB ADL, maxA (+2) for LB ADL. Pt on heated HFNC start of session (45L, 100% FiO2) with O2 sats down to mid-high 70s% seated EOB, applied NRB at 15L with O2 sats increasing to 81-83%, utilized NRB with transfer to recliner with SpO2 maintaining 80%. Able to remove NRB once seated in recliner with SpO2 maintaining 88-90% on heated HFNC. BP also monitored (see below). Pt will benefit from continued acute OT services and recommend follow up therapy services in SNF setting after discharge to maximize his safety and independence with ADL and mobility. Will follow.   BP supine: 98/67 (72) Sitting EOB: 114/51 (70) Post transfer to recliner: 114/51 (70)     Follow Up Recommendations  SNF;Supervision/Assistance - 24 hour    Equipment Recommendations  Other (comment);3 in 1 bedside commode(defer to next venue)           Precautions / Restrictions Precautions Precautions: Fall Precaution Comments: HHFNC, required NRB additionally with activity Restrictions Weight Bearing Restrictions: No      Mobility Bed Mobility Overal bed mobility: Needs Assistance Bed Mobility: Supine to Sit     Supine to sit: Mod assist;+2 for safety/equipment;HOB elevated     General bed mobility comments: pt able to initiate LEs towards  EOB, required assist for LEs fully over EOB and for trunk elevation  Transfers Overall transfer level: Needs assistance Equipment used: 2 person hand held assist Transfers: Sit to/from [facility] Sit to Stand: Mod assist;+2 physical assistance;+2 safety/equipment Stand pivot transfers: Mod assist;+2 physical assistance;+2 safety/equipment       General transfer comment: assist for boosting and steadying assist; pt able to take few pivotal steps to recliner with two person steadying assist throughout     Balance Overall balance assessment: Needs assistance Sitting-balance support: Feet supported Sitting balance-Leahy Scale: Fair Sitting balance - Comments: close minguard for sitting balance once stabilized    Standing balance support: Bilateral upper extremity supported Standing balance-Leahy Scale: Poor Standing balance comment: reliant on UE support and external assist                           ADL either performed or assessed with clinical judgement   ADL Overall ADL's : Needs assistance/impaired Eating/Feeding: Set up;Sitting   Grooming: Min guard;Set up;Sitting   Upper Body Bathing: Minimal assistance;Sitting   Lower Body Bathing: Moderate assistance;Maximal assistance;+2 for physical assistance;+2 for safety/equipment;Sit to/from stand   Upper Body Dressing : Minimal assistance;Sitting   Lower Body Dressing: Moderate assistance;Maximal assistance;+2 for physical assistance;+2 for safety/equipment;Sit to/from stand Lower Body Dressing Details (indicate cue type and reason): modA+2 for sit<>Stand Toilet Transfer: Moderate assistance;+2 for physical assistance;+2 for safety/equipment;Stand-pivot Toilet Transfer Details (indicate cue type and reason): simulated via transfer to recliner, [place] and Hygiene: Maximal assistance;+2 for physical assistance;+2 for safety/equipment;Sit to/from stand  Functional  mobility during ADLs: Moderate assistance;+2 for physical assistance;+2 for safety/equipment(HHA, stand pivot only) General ADL Comments: pt with limitations due to weakness, decreased activity tolerance, poor standing balance      Vision         Perception     Praxis      Pertinent Vitals/Pain Pain Assessment: No/denies pain     Hand Dominance     Extremity/Trunk Assessment Upper Extremity Assessment Upper Extremity Assessment: Generalized weakness   Lower Extremity Assessment Lower Extremity Assessment: Defer to PT evaluation   Cervical / Trunk Assessment Cervical / Trunk Assessment: Kyphotic   Communication Communication Communication: HOH   Cognition Arousal/Alertness: Awake/alert Behavior During Therapy: WFL for tasks assessed/performed Overall Cognitive Status: No family/caregiver present to determine baseline cognitive functioning                                 General Comments: pt somewhat impulsive to move and attempt mobility tasks requiring frequent cues for safety, pt HOH and soft spoken also requiring increased repetition    General Comments       Exercises     Shoulder Instructions      Home Living Family/patient expects to be discharged to:: Private residence Living Arrangements: Spouse/significant other Available Help at Discharge: Family;Available 24 hours/day Type of Home: House Home Access: Stairs to enter     Home Layout: One level     Bathroom Shower/Tub: Walk-in shower         Home Equipment: Kasandra Knudsen - single point;Shower seat - built in   Additional Comments: Reports wife indep and "in good health since she's only 30"      Prior Functioning/Environment Level of Independence: Independent with assistive device(s)        Comments: Reports mod indep with intermittent use of SPC        OT Problem List: Decreased strength;Decreased range of motion;Impaired balance (sitting and/or standing);Decreased activity  tolerance;Decreased knowledge of use of DME or AE;Cardiopulmonary status limiting activity;Decreased safety awareness      OT Treatment/Interventions: Self-care/ADL training;Therapeutic exercise;Energy conservation;DME and/or AE instruction;Therapeutic activities;Patient/family education;Balance training;Cognitive remediation/compensation    OT Goals(Current goals can be found in the care plan section) Acute Rehab OT Goals Patient Stated Goal: Get home OT Goal Formulation: With patient Time For Goal Achievement: 10/14/19 Potential to Achieve Goals: Good  OT Frequency: Min 2X/week   Barriers to D/C:            Co-evaluation              AM-PAC OT "6 Clicks" Daily Activity     Outcome Measure Help from another person eating meals?: A Little Help from another person taking care of personal grooming?: A Little Help from another person toileting, which includes using toliet, bedpan, or urinal?: A Lot Help from another person bathing (including washing, rinsing, drying)?: A Lot Help from another person to put on and taking off regular upper body clothing?: A Little Help from another person to put on and taking off regular lower body clothing?: A Lot 6 Click Score: 15   End of Session Equipment Utilized During Treatment: Oxygen Nurse Communication: Mobility status(O2 status with mobility)  Activity Tolerance: Patient tolerated treatment well Patient left: in chair;with call bell/phone within reach;with chair alarm set  OT Visit Diagnosis: Muscle weakness (generalized) (M62.81);Unsteadiness on feet (R26.81)  Time: [id number] OT Time Calculation (min): 26 min Charges:  OT General Charges $OT Visit: 1 Visit OT Evaluation $OT Eval Moderate Complexity: 1 Mod OT Treatments $Self Care/Home Management : 8-22 mins  Lou Cal, OT [facility] Pager [phone] Office [phone]   Raymondo Band 09/30/2019, 4:09 PM

## 2019-10-01 LAB — CBC WITH DIFFERENTIAL/PLATELET
Abs Immature Granulocytes: 0.11 10*3/uL — ABNORMAL HIGH (ref 0.00–0.07)
Basophils Absolute: 0 10*3/uL (ref 0.0–0.1)
Basophils Relative: 0 %
Eosinophils Absolute: 0 10*3/uL (ref 0.0–0.5)
Eosinophils Relative: 0 %
HCT: 53.2 % — ABNORMAL HIGH (ref 39.0–52.0)
Hemoglobin: 17.4 g/dL — ABNORMAL HIGH (ref 13.0–17.0)
Immature Granulocytes: 1 %
Lymphocytes Relative: 4 %
Lymphs Abs: 0.5 10*3/uL — ABNORMAL LOW (ref 0.7–4.0)
MCH: 31.2 pg (ref 26.0–34.0)
MCHC: 32.7 g/dL (ref 30.0–36.0)
MCV: 95.3 fL (ref 80.0–100.0)
Monocytes Absolute: 0.3 10*3/uL (ref 0.1–1.0)
Monocytes Relative: 3 %
Neutro Abs: 11.6 10*3/uL — ABNORMAL HIGH (ref 1.7–7.7)
Neutrophils Relative %: 92 %
Platelets: 137 10*3/uL — ABNORMAL LOW (ref 150–400)
RBC: 5.58 MIL/uL (ref 4.22–5.81)
RDW: 14.4 % (ref 11.5–15.5)
WBC: 12.6 10*3/uL — ABNORMAL HIGH (ref 4.0–10.5)
nRBC: 0 % (ref 0.0–0.2)

## 2019-10-01 LAB — COMPREHENSIVE METABOLIC PANEL
ALT: 42 U/L (ref 0–44)
AST: 61 U/L — ABNORMAL HIGH (ref 15–41)
Albumin: 3.1 g/dL — ABNORMAL LOW (ref 3.5–5.0)
Alkaline Phosphatase: 140 U/L — ABNORMAL HIGH (ref 38–126)
Anion gap: 13 (ref 5–15)
BUN: 35 mg/dL — ABNORMAL HIGH (ref 8–23)
CO2: 30 mmol/L (ref 22–32)
Calcium: 8.9 mg/dL (ref 8.9–10.3)
Chloride: 94 mmol/L — ABNORMAL LOW (ref 98–111)
Creatinine, Ser: 0.58 mg/dL — ABNORMAL LOW (ref 0.61–1.24)
GFR calc Af Amer: >60 mL/min (ref >60)
GFR calc non Af Amer: >60 mL/min (ref >60)
Glucose, Bld: 145 mg/dL — ABNORMAL HIGH (ref 70–99)
Potassium: 4.8 mmol/L (ref 3.5–5.1)
Sodium: 137 mmol/L (ref 135–145)
Total Bilirubin: 3.4 mg/dL — ABNORMAL HIGH (ref 0.3–1.2)
Total Protein: 6.1 g/dL — ABNORMAL LOW (ref 6.5–8.1)

## 2019-10-01 LAB — GLUCOSE, CAPILLARY
Glucose-Capillary: 157 mg/dL — ABNORMAL HIGH (ref 70–99)
Glucose-Capillary: 325 mg/dL — ABNORMAL HIGH (ref 70–99)
Glucose-Capillary: 323 mg/dL — ABNORMAL HIGH (ref 70–99)
Glucose-Capillary: 266 mg/dL — ABNORMAL HIGH (ref 70–99)

## 2019-10-01 LAB — D-DIMER, QUANTITATIVE: D-Dimer, Quant: 5.99 ug/mL-FEU — ABNORMAL HIGH (ref 0.00–0.50)

## 2019-10-01 LAB — MAGNESIUM: Magnesium: 1.9 mg/dL (ref 1.7–2.4)

## 2019-10-01 LAB — PHOSPHORUS: Phosphorus: 3.5 mg/dL (ref 2.5–4.6)

## 2019-10-01 LAB — FERRITIN: Ferritin: 343 ng/mL — ABNORMAL HIGH (ref 24–336)

## 2019-10-01 LAB — PROCALCITONIN: Procalcitonin: <0.1 ng/mL

## 2019-10-01 LAB — C-REACTIVE PROTEIN: CRP: 0.8 mg/dL (ref <1.0)

## 2019-10-01 NOTE — Progress Notes (Signed)
PROGRESS NOTE    Robert Cuevas  [id number] DOB: 1930/05/07 DOA: 09/25/2019 PCP: [facility]   Brief Narrative:  85 y.o.malewith medical history significant forcomplete heart block with pacer, hypertension, paroxysmal atrial fibrillation on Eliquis, chronic hypoxic respiratory failure, and non-insulin-dependent diabetes mellitus, diagnosed with COVID-19 on 09/21/2019 and now presenting with progressive shortness of breath.Patient was seen in the emergency department on 09/21/2019 to request COVID-19 testing because his wife was positive for the virus though the patient denies any symptoms at that time. He since developed progressive shortness of breath and went to [facility] ED for evaluation, patient noted to be profoundly hypoxic, requiring BiPAP 100% at [facility] ED, he was transferred to [facility] for further management   Subjective: 12/12 afebrile last 24 hours A/O x4, positive S OB     Assessment & Plan:   Principal Problem:   Acute on chronic respiratory failure with hypoxia (HCC) Active Problems:   Complete heart block (HCC)   Paroxysmal atrial fibrillation ([place])   COVID-19 virus infection   Diabetes mellitus type II, non insulin dependent ([place])   Acute respiratory failure due to COVID-19 ([place])   Hyperbilirubinemia   Pressure injury of skin   Diabetes mellitus type 2, uncontrolled, with complications ([place])   [id number] pneumonia; acute on chronic hypoxic respiratory failure COVID-19 Labs  Recent Labs    09/29/19 0012 09/30/19 0524 10/01/19 0303  DDIMER 6.37* 7.48* 5.99*  FERRITIN  --  349* 343*  CRP 1.8* 1.1* 0.8   12/2 POC SARS coronavirus positive -Decadron 6 mg daily -Remdesivir per pharmacy protocol -12/6 transfuse 1 unit Covid convalescent plasma -12/6 Actemra x1 dose -Complete course antibiotics for  CAP -12/10 increased WOB/O2 demand.  PCXR not consistent with worsening Covid pneumonia. -12/10 procalcitonin pending -12/10 Albumin 25 g +  Lasix IV 60 mg  Lactic acidosis -Most likely secondary to yesterday's diuresis. -Procalcitonin not indicative of infection, mild leukocytosis most likely secondary to steroids, negative bands or left shift. -Continue to monitor for secondary infection  Paroxysmal atrial fibrillation -Currently paced; NSR -CHADS-VASc is at least 4 (age x2, HTN, DM) -Continue Eliquis 5 mg  BID  -Metoprolol 50 mg BID  Essential HTN -See A. fib  Diabetes type 2 uncontrolled with complication -[id number] hemoglobin A1c= 8.4 -On Metformin at home obviously not managing patient's diabetes.  Would need to increase dose to 1000 mg  BID prior to discharge if patient survives hospitalization  -Levemir 6 units daily -NovoLog 2 units TID -Sensitive SSI  Hyperbilirubinemia -Total bilirubin is 3.2 in ED with normal transaminases and benign abdominal exam -Improving continue to monitor    DVT prophylaxis: Eliquis Code Status: DNR Family Communication: 12/12 left message on Ruby (wife) phone that I had called to give her an update Disposition Plan: TBD   Consultants:    Procedures/Significant Events:  12/10 PCXR; Right worse than left airspace disease persists but has improved   I have personally reviewed and interpreted all radiology studies and my findings are as above.  VENTILATOR SETTINGS: HFNC 12/12 Flow rate; 30 L/min FiO2; 100% SPO2; 94%   Cultures 12/2 POC SARS coronavirus positive    Antimicrobials: Anti-infectives (From admission, onward)   Start     Dose/Rate Stop   09/26/19 1000  remdesivir 100 mg in sodium chloride 0.9 % 100 mL IVPB  Status:  Discontinued     100 mg 200 mL/hr over 30 Minutes 09/25/19 0406   09/25/19 1100  azithromycin (ZITHROMAX) 500 mg in sodium chloride 0.9 % 250 mL  IVPB     500 mg 250 mL/hr over 60 Minutes 09/28/19 1933   09/25/19 1000  remdesivir 100 mg in sodium chloride 0.9 % 100 mL IVPB     100 mg 200 mL/hr over 30 Minutes 09/28/19 1933    09/25/19 1000  cefTRIAXone (ROCEPHIN) 2 g in sodium chloride 0.9 % 100 mL IVPB     2 g 200 mL/hr over 30 Minutes 09/28/19 1933   09/25/19 0400  remdesivir 200 mg in sodium chloride 0.9% 250 mL IVPB  Status:  Discontinued     200 mg 580 mL/hr over 30 Minutes 09/25/19 0406       Devices    LINES / TUBES:      Continuous Infusions: . sodium chloride Stopped (09/28/19 1933)     Objective: Vitals:   10/01/19 0730 10/01/19 0821 10/01/19 1020 10/01/19 1142  BP:  138/64 138/64 (!) 108/53  Pulse: 70 70 70 70  Resp: 20 20 17 19   Temp:  (!) 96.8 F (36 C)  97.6 F (36.4 C)  TempSrc:  Axillary  Oral  SpO2: 95% 98% 95% 95%  Weight:      Height:        Intake/Output Summary (Last 24 hours) at 10/01/2019 1229 Last data filed at 10/01/2019 0800 Gross per 24 hour  Intake 840 ml  Output 750 ml  Net 90 ml   Filed Weights   09/25/19 0500  Weight: 60.8 kg   Physical Exam:  General: A/O x4, no acute respiratory distress, cachectic Eyes: negative scleral hemorrhage, negative anisocoria, negative icterus ENT: Negative Runny nose, negative gingival bleeding, Neck:  Negative scars, masses, torticollis, lymphadenopathy, JVD Lungs: Clear to auscultation bilaterally without wheezes or crackles Cardiovascular: Paced rhythm without murmur gallop or rub normal S1 and S2 Abdomen: negative abdominal pain, nondistended, positive soft, bowel sounds, no rebound, no ascites, no appreciable mass Extremities: No significant cyanosis, clubbing, or edema bilateral lower extremities Skin: Negative rashes, lesions, ulcers Psychiatric:  Negative depression, negative anxiety, negative fatigue, negative mania  Central nervous system:  Cranial nerves II through XII intact, tongue/uvula midline, all extremities muscle strength 5/5, sensation intact throughout, negative dysarthria, negative expressive aphasia, negative receptive aphasia.  .     Data Reviewed: Care during the described time interval  was provided by me .  I have reviewed this patient's available data, including medical history, events of note, physical examination, and all test results as part of my evaluation.   CBC: Recent Labs  Lab 09/27/19 0251 09/28/19 0502 09/29/19 0012 09/30/19 0524 10/01/19 0303  WBC 10.3 10.1 10.0 11.5* 12.6*  NEUTROABS 9.3* 8.9* 9.0* 10.6* 11.6*  HGB 15.8 17.8* 16.8 17.8* 17.4*  HCT 48.4 54.5* 50.6 53.6* 53.2*  MCV 95.3 97.7 95.3 95.5 95.3  PLT 207 192 168 152 0000000*   Basic Metabolic Panel: Recent Labs  Lab 09/27/19 0251 09/28/19 0502 09/29/19 0012 09/30/19 0524 10/01/19 0303  NA 138 140 138 138 137  K 4.0 4.0 4.4 3.9 4.8  CL 99 98 102 94* 94*  CO2 27 31 24 28 30   GLUCOSE 163* 189* 91 102* 145*  BUN 36* 33* 28* 32* 35*  CREATININE 0.68 0.60* 0.59* 0.62 0.58*  CALCIUM 8.4* 8.7* 8.5* 9.1 8.9  MG 1.9 2.0 1.8 1.8 1.9  PHOS 2.8 2.5 2.6 3.4 3.5   GFR: Estimated Creatinine Clearance: 55.9 mL/min (A) (by C-G formula based on SCr of 0.58 mg/dL (L)). Liver Function Tests: Recent Labs  Lab 09/27/19 0251 09/28/19 0502 09/29/19  0012 09/30/19 0524 10/01/19 0303  AST 41 53* 50* 61* 61*  ALT 24 30 28  36 42  ALKPHOS 140* 150* 130* 145* 140*  BILITOT 1.5* 2.3* 1.8* 3.1* 3.4*  PROT 6.2* 6.8 5.8* 6.3* 6.1*  ALBUMIN 2.6* 3.0* 2.5* 3.2* 3.1*   No results for input(s): LIPASE, AMYLASE in the last 168 hours. No results for input(s): AMMONIA in the last 168 hours. Coagulation Profile: No results for input(s): INR, PROTIME in the last 168 hours. Cardiac Enzymes: No results for input(s): CKTOTAL, CKMB, CKMBINDEX, TROPONINI in the last 168 hours. BNP (last 3 results) No results for input(s): PROBNP in the last 8760 hours. HbA1C: No results for input(s): HGBA1C in the last 72 hours. CBG: Recent Labs  Lab 09/30/19 1152 09/30/19 1604 09/30/19 2035 10/01/19 0726 10/01/19 1137  GLUCAP 246* 227* 278* 157* 325*   Lipid Profile: Recent Labs    09/29/19 0012  CHOL 35  HDL 16*   LDLCALC 14  TRIG 24  CHOLHDL 2.2   Thyroid Function Tests: No results for input(s): TSH, T4TOTAL, FREET4, T3FREE, THYROIDAB in the last 72 hours. Anemia Panel: Recent Labs    09/30/19 0524 10/01/19 0303  FERRITIN 349* 343*   Urine analysis: No results found for: COLORURINE, APPEARANCEUR, LABSPEC, PHURINE, GLUCOSEU, HGBUR, BILIRUBINUR, KETONESUR, PROTEINUR, UROBILINOGEN, NITRITE, LEUKOCYTESUR Sepsis Labs: @LABRCNTIP (procalcitonin:4,lacticidven:4)  ) Recent Results (from the past 240 hour(s))  MRSA PCR Screening     Status: None   Collection Time: 09/25/19  4:33 AM   Specimen: Nasal Mucosa; Nasopharyngeal  Result Value Ref Range Status   MRSA by PCR NEGATIVE NEGATIVE Final    Comment:        The GeneXpert MRSA Assay (FDA approved for NASAL specimens only), is one component of a comprehensive MRSA colonization surveillance program. It is not intended to diagnose MRSA infection nor to guide or monitor treatment for MRSA infections. Performed at [facility], [place] [address], [place], [place] [address]          Radiology Studies: DG CHEST PORT 1 VIEW  Result Date: 09/29/2019 CLINICAL DATA:  COVID-19 pneumonia. EXAM: PORTABLE CHEST 1 VIEW COMPARISON:  Single view of the chest 09/25/2019. FINDINGS: Right worse than left airspace disease persists. Aeration in the lower lung zones has improved compared to the prior study. No pneumothorax or pleural effusion. Heart size is upper normal. Atherosclerosis and pacing device noted. IMPRESSION: Right worse than left airspace disease persists but has improved. Electronically Signed   By: Inge Rise M.D.   On: 09/29/2019 12:49        Scheduled Meds: . apixaban  5 mg Oral BID  . atorvastatin  20 mg Oral q1800  . Chlorhexidine Gluconate Cloth  6 each Topical Daily  . dexamethasone (DECADRON) injection  6 mg Intravenous Q24H  . famotidine  20 mg Oral BID  . feeding supplement (ENSURE ENLIVE)  237 mL  Oral TID BM  . insulin aspart  0-9 Units Subcutaneous TID WC  . insulin aspart  2 Units Subcutaneous TID WC  . insulin detemir  6 Units Subcutaneous Daily  . mouth rinse  15 mL Mouth Rinse BID  . metoprolol tartrate  50 mg Oral BID  . pneumococcal 23 valent vaccine  0.5 mL Intramuscular Tomorrow-1000  . ramipril  2.5 mg Oral Daily  . sodium chloride flush  3 mL Intravenous Q12H  . sodium chloride flush  3 mL Intravenous Q12H   Continuous Infusions: . sodium chloride Stopped (09/28/19 1933)  LOS: 6 days   The patient is critically ill with multiple organ systems failure and requires high complexity decision making for assessment and support, frequent evaluation and titration of therapies, application of advanced monitoring technologies and extensive interpretation of multiple databases. Critical Care Time devoted to patient care services described in this note  Time spent: 40 minutes     Zarai Orsborn, Geraldo Docker, MD [facility] Pager [phone]  If 7PM-7AM, please contact night-coverage [email or web address] Password [facility] 10/01/2019, 12:29 PM

## 2019-10-02 LAB — COMPREHENSIVE METABOLIC PANEL
ALT: 45 U/L — ABNORMAL HIGH (ref 0–44)
AST: 61 U/L — ABNORMAL HIGH (ref 15–41)
Albumin: 3 g/dL — ABNORMAL LOW (ref 3.5–5.0)
Alkaline Phosphatase: 145 U/L — ABNORMAL HIGH (ref 38–126)
Anion gap: 11 (ref 5–15)
BUN: 30 mg/dL — ABNORMAL HIGH (ref 8–23)
CO2: 28 mmol/L (ref 22–32)
Calcium: 8.9 mg/dL (ref 8.9–10.3)
Chloride: 98 mmol/L (ref 98–111)
Creatinine, Ser: 0.56 mg/dL — ABNORMAL LOW (ref 0.61–1.24)
GFR calc Af Amer: >60 mL/min (ref >60)
GFR calc non Af Amer: >60 mL/min (ref >60)
Glucose, Bld: 89 mg/dL (ref 70–99)
Potassium: 4.6 mmol/L (ref 3.5–5.1)
Sodium: 137 mmol/L (ref 135–145)
Total Bilirubin: 2.9 mg/dL — ABNORMAL HIGH (ref 0.3–1.2)
Total Protein: 6 g/dL — ABNORMAL LOW (ref 6.5–8.1)

## 2019-10-02 LAB — CBC WITH DIFFERENTIAL/PLATELET
Abs Immature Granulocytes: 0.09 10*3/uL — ABNORMAL HIGH (ref 0.00–0.07)
Basophils Absolute: 0 10*3/uL (ref 0.0–0.1)
Basophils Relative: 0 %
Eosinophils Absolute: 0 10*3/uL (ref 0.0–0.5)
Eosinophils Relative: 0 %
HCT: 51.1 % (ref 39.0–52.0)
Hemoglobin: 17.1 g/dL — ABNORMAL HIGH (ref 13.0–17.0)
Immature Granulocytes: 1 %
Lymphocytes Relative: 3 %
Lymphs Abs: 0.4 10*3/uL — ABNORMAL LOW (ref 0.7–4.0)
MCH: 32.1 pg (ref 26.0–34.0)
MCHC: 33.5 g/dL (ref 30.0–36.0)
MCV: 96.1 fL (ref 80.0–100.0)
Monocytes Absolute: 0.3 10*3/uL (ref 0.1–1.0)
Monocytes Relative: 2 %
Neutro Abs: 12.5 10*3/uL — ABNORMAL HIGH (ref 1.7–7.7)
Neutrophils Relative %: 94 %
Platelets: 122 10*3/uL — ABNORMAL LOW (ref 150–400)
RBC: 5.32 MIL/uL (ref 4.22–5.81)
RDW: 14.3 % (ref 11.5–15.5)
WBC: 13.4 10*3/uL — ABNORMAL HIGH (ref 4.0–10.5)
nRBC: 0 % (ref 0.0–0.2)

## 2019-10-02 LAB — D-DIMER, QUANTITATIVE: D-Dimer, Quant: 3.29 ug/mL-FEU — ABNORMAL HIGH (ref 0.00–0.50)

## 2019-10-02 LAB — FERRITIN: Ferritin: 357 ng/mL — ABNORMAL HIGH (ref 24–336)

## 2019-10-02 LAB — GLUCOSE, CAPILLARY
Glucose-Capillary: 108 mg/dL — ABNORMAL HIGH (ref 70–99)
Glucose-Capillary: 258 mg/dL — ABNORMAL HIGH (ref 70–99)
Glucose-Capillary: 240 mg/dL — ABNORMAL HIGH (ref 70–99)
Glucose-Capillary: 242 mg/dL — ABNORMAL HIGH (ref 70–99)

## 2019-10-02 LAB — MAGNESIUM: Magnesium: 1.8 mg/dL (ref 1.7–2.4)

## 2019-10-02 LAB — C-REACTIVE PROTEIN: CRP: 0.6 mg/dL (ref <1.0)

## 2019-10-02 LAB — PHOSPHORUS: Phosphorus: 2.9 mg/dL (ref 2.5–4.6)

## 2019-10-02 MED ORDER — METOPROLOL TARTRATE 25 MG PO TABS
25.0000 mg | ORAL_TABLET | Freq: Two times a day (BID) | ORAL | Status: DC
  Administered 2019-10-02 – 2019-10-03 (×2): 25 mg via ORAL
  Filled 2019-10-02 (×2): qty 1

## 2019-10-02 NOTE — Plan of Care (Signed)
  Problem: Respiratory: Goal: Will maintain a patent airway Outcome: Progressing   Problem: Coping: Goal: Psychosocial and spiritual needs will be supported Outcome: Progressing   Problem: Clinical Measurements: Goal: Cardiovascular complication will be avoided Outcome: Progressing   Problem: Elimination: Goal: Will not experience complications related to bowel motility Outcome: Progressing

## 2019-10-02 NOTE — Progress Notes (Signed)
Removed HHFNC at this time and placed pt on salter at 6, then 4 LPM. Sats remaineed 96 or greater.  RT will inform RN.

## 2019-10-02 NOTE — Plan of Care (Signed)
Discusse with patient plan of care for the evening, pain management and tried to get him to use his incentive spirometry with some teach back displayed.  Unable to update family due to giving direct patient care, will try to in the morning.

## 2019-10-02 NOTE — Progress Notes (Signed)
ICU CN/RRN Rounding Note - Patient reports feeling improved. ICU team will continue to round on this patient. If the bedside RN needs any assistance please call charge [phone]

## 2019-10-02 NOTE — Progress Notes (Signed)
PROGRESS NOTE    Robert Cuevas  [id number] DOB: [birth year] DOA: 09/25/2019 PCP: [facility]   Brief Narrative:  85 y.o.malewith medical history significant forcomplete heart block with pacer, hypertension, paroxysmal atrial fibrillation on Eliquis, chronic hypoxic respiratory failure, and non-insulin-dependent diabetes mellitus, diagnosed with COVID-19 on 09/21/2019 and now presenting with progressive shortness of breath.Patient was seen in the emergency department on 09/21/2019 to request COVID-19 testing because his wife was positive for the virus though the patient denies any symptoms at that time. He since developed progressive shortness of breath and went to [facility] ED for evaluation, patient noted to be profoundly hypoxic, requiring BiPAP 100% at [facility] ED, he was transferred to [facility] for further management   Subjective: 12/13 afebrile last 24 hours A/O x4, positive S OB but significantly improved.  Patient wanting to sit up in bed   Assessment & Plan:   Principal Problem:   Acute on chronic respiratory failure with hypoxia (HCC) Active Problems:   Complete heart block (HCC)   Paroxysmal atrial fibrillation ([place])   COVID-19 virus infection   Diabetes mellitus type II, non insulin dependent (HCC)   Acute respiratory failure due to COVID-19 ([place])   Hyperbilirubinemia   Pressure injury of skin   Diabetes mellitus type 2, uncontrolled, with complications ([place])   [id number] pneumonia; acute on chronic hypoxic respiratory failure COVID-19 Labs  Recent Labs    09/30/19 0524 10/01/19 0303 10/02/19 0033  DDIMER 7.48* 5.99* 3.29*  FERRITIN 349* 343* 357*  CRP 1.1* 0.8 0.6   12/2 POC SARS coronavirus positive -Decadron 6 mg daily -Remdesivir per pharmacy protocol -12/6 transfuse 1 unit Covid convalescent plasma -12/6 Actemra x1 dose -Complete course antibiotics for  CAP -12/10 increased WOB/O2 demand.  PCXR not consistent with worsening Covid  pneumonia. -12/10 procalcitonin pending -12/10 Albumin 25 g + Lasix IV 60 mg  Lactic acidosis -Most likely secondary to yesterday's diuresis. -Procalcitonin not indicative of infection, mild leukocytosis most likely secondary to steroids, negative bands or left shift. -Continue to monitor for secondary infection  Paroxysmal atrial fibrillation -Currently paced; NSR -CHADS-VASc is at least 4 (age x2, HTN, DM) -Continue Eliquis 5 mg  BID  -12/13 decrease Metoprolol 25 mg BID  Essential HTN -See A. fib  Diabetes type 2 uncontrolled with complication -[id number] hemoglobin A1c= 8.4 -On Metformin at home obviously not managing patient's diabetes.  Would need to increase dose to 1000 mg  BID prior to discharge if patient survives hospitalization  -Levemir 6 units daily -NovoLog 2 units TID -Sensitive SSI  Hyperbilirubinemia -Total bilirubin is 3.2 in ED with normal transaminases and benign abdominal exam -Improving continue to monitor    DVT prophylaxis: Eliquis Code Status: DNR Family Communication: 12/12 left message on Ruby (wife) phone that I had called to give her an update Disposition Plan: TBD   Consultants:    Procedures/Significant Events:  12/10 PCXR; Right worse than left airspace disease persists but has improved   I have personally reviewed and interpreted all radiology studies and my findings are as above.  VENTILATOR SETTINGS: HFNC 12/13 Flow rate; 20 L/min FiO2; 70% SPO2; 93%   Cultures 12/2 POC SARS coronavirus positive    Antimicrobials: Anti-infectives (From admission, onward)   Start     Dose/Rate Stop   09/26/19 1000  remdesivir 100 mg in sodium chloride 0.9 % 100 mL IVPB  Status:  Discontinued     100 mg 200 mL/hr over 30 Minutes 09/25/19 0406   09/25/19 1100  azithromycin (ZITHROMAX)  500 mg in sodium chloride 0.9 % 250 mL IVPB     500 mg 250 mL/hr over 60 Minutes 09/28/19 1933   09/25/19 1000  remdesivir 100 mg in sodium chloride  0.9 % 100 mL IVPB     100 mg 200 mL/hr over 30 Minutes 09/28/19 1933   09/25/19 1000  cefTRIAXone (ROCEPHIN) 2 g in sodium chloride 0.9 % 100 mL IVPB     2 g 200 mL/hr over 30 Minutes 09/28/19 1933   09/25/19 0400  remdesivir 200 mg in sodium chloride 0.9% 250 mL IVPB  Status:  Discontinued     200 mg 580 mL/hr over 30 Minutes 09/25/19 0406       Devices    LINES / TUBES:      Continuous Infusions: . sodium chloride Stopped (09/28/19 1933)     Objective: Vitals:   10/02/19 0700 10/02/19 0714 10/02/19 0800 10/02/19 0814  BP: 132/65  130/60   Pulse: 70 70 70   Resp: 20 17 18    Temp:   (!) 96.4 F (35.8 C)   TempSrc:   Axillary   SpO2: 91% 93% 94% 93%  Weight:      Height:        Intake/Output Summary (Last 24 hours) at 10/02/2019 0849 Last data filed at 10/02/2019 0630 Gross per 24 hour  Intake 1177 ml  Output 1675 ml  Net -498 ml   Filed Weights   09/25/19 0500  Weight: 60.8 kg   Physical Exam:  General: A/O x4, positive  acute respiratory distress, cachectic but no longer anorexic Eyes: negative scleral hemorrhage, negative anisocoria, negative icterus ENT: Negative Runny nose, negative gingival bleeding, Neck:  Negative scars, masses, torticollis, lymphadenopathy, JVD Lungs: Clear to auscultation bilaterally without wheezes or crackles Cardiovascular: (Paced rhythm) without murmur gallop or rub normal S1 and S2 Abdomen: negative abdominal pain, nondistended, positive soft, bowel sounds, no rebound, no ascites, no appreciable mass Extremities: No significant cyanosis, clubbing, or edema bilateral lower extremities Skin: Negative rashes, lesions, ulcers Psychiatric:  Negative depression, negative anxiety, negative fatigue, negative mania  Central nervous system:  Cranial nerves II through XII intact, tongue/uvula midline, all extremities muscle strength 5/5, sensation intact throughout, negative dysarthria, negative expressive aphasia, negative receptive  aphasia.  .     Data Reviewed: Care during the described time interval was provided by me .  I have reviewed this patient's available data, including medical history, events of note, physical examination, and all test results as part of my evaluation.   CBC: Recent Labs  Lab 09/28/19 0502 09/29/19 0012 09/30/19 0524 10/01/19 0303 10/02/19 0033  WBC 10.1 10.0 11.5* 12.6* 13.4*  NEUTROABS 8.9* 9.0* 10.6* 11.6* 12.5*  HGB 17.8* 16.8 17.8* 17.4* 17.1*  HCT 54.5* 50.6 53.6* 53.2* 51.1  MCV 97.7 95.3 95.5 95.3 96.1  PLT 192 168 152 137* [id number]*   Basic Metabolic Panel: Recent Labs  Lab 09/28/19 0502 09/29/19 0012 09/30/19 0524 10/01/19 0303 10/02/19 0033  NA 140 138 138 137 137  K 4.0 4.4 3.9 4.8 4.6  CL 98 102 94* 94* 98  CO2 31 24 28 30 28   GLUCOSE 189* 91 102* 145* 89  BUN 33* 28* 32* 35* 30*  CREATININE 0.60* 0.59* 0.62 0.58* 0.56*  CALCIUM 8.7* 8.5* 9.1 8.9 8.9  MG 2.0 1.8 1.8 1.9 1.8  PHOS 2.5 2.6 3.4 3.5 2.9   GFR: Estimated Creatinine Clearance: 55.9 mL/min (A) (by C-G formula based on SCr of 0.56 mg/dL (L)). Liver Function  Tests: Recent Labs  Lab 09/28/19 0502 09/29/19 0012 09/30/19 0524 10/01/19 0303 10/02/19 0033  AST 53* 50* 61* 61* 61*  ALT 30 28 36 42 45*  ALKPHOS 150* 130* 145* 140* 145*  BILITOT 2.3* 1.8* 3.1* 3.4* 2.9*  PROT 6.8 5.8* 6.3* 6.1* 6.0*  ALBUMIN 3.0* 2.5* 3.2* 3.1* 3.0*   No results for input(s): LIPASE, AMYLASE in the last 168 hours. No results for input(s): AMMONIA in the last 168 hours. Coagulation Profile: No results for input(s): INR, PROTIME in the last 168 hours. Cardiac Enzymes: No results for input(s): CKTOTAL, CKMB, CKMBINDEX, TROPONINI in the last 168 hours. BNP (last 3 results) No results for input(s): PROBNP in the last 8760 hours. HbA1C: No results for input(s): HGBA1C in the last 72 hours. CBG: Recent Labs  Lab 10/01/19 0726 10/01/19 1137 10/01/19 1624 10/01/19 2029 10/02/19 0805  GLUCAP 157* 325* 323*  266* 108*   Lipid Profile: No results for input(s): CHOL, HDL, LDLCALC, TRIG, CHOLHDL, LDLDIRECT in the last 72 hours. Thyroid Function Tests: No results for input(s): TSH, T4TOTAL, FREET4, T3FREE, THYROIDAB in the last 72 hours. Anemia Panel: Recent Labs    10/01/19 0303 10/02/19 0033  FERRITIN 343* 357*   Urine analysis: No results found for: COLORURINE, APPEARANCEUR, LABSPEC, PHURINE, GLUCOSEU, HGBUR, BILIRUBINUR, KETONESUR, PROTEINUR, UROBILINOGEN, NITRITE, LEUKOCYTESUR Sepsis Labs: @LABRCNTIP (procalcitonin:4,lacticidven:4)  ) Recent Results (from the past 240 hour(s))  MRSA PCR Screening     Status: None   Collection Time: 09/25/19  4:33 AM   Specimen: Nasal Mucosa; Nasopharyngeal  Result Value Ref Range Status   MRSA by PCR NEGATIVE NEGATIVE Final    Comment:        The GeneXpert MRSA Assay (FDA approved for NASAL specimens only), is one component of a comprehensive MRSA colonization surveillance program. It is not intended to diagnose MRSA infection nor to guide or monitor treatment for MRSA infections. Performed at [facility], [place] [address], [place], [place] [address]          Radiology Studies: No results found.      Scheduled Meds: . apixaban  5 mg Oral BID  . atorvastatin  20 mg Oral q1800  . Chlorhexidine Gluconate Cloth  6 each Topical Daily  . dexamethasone (DECADRON) injection  6 mg Intravenous Q24H  . famotidine  20 mg Oral BID  . feeding supplement (ENSURE ENLIVE)  237 mL Oral TID BM  . insulin aspart  0-9 Units Subcutaneous TID WC  . insulin aspart  2 Units Subcutaneous TID WC  . insulin detemir  6 Units Subcutaneous Daily  . mouth rinse  15 mL Mouth Rinse BID  . metoprolol tartrate  50 mg Oral BID  . pneumococcal 23 valent vaccine  0.5 mL Intramuscular Tomorrow-1000  . ramipril  2.5 mg Oral Daily  . sodium chloride flush  3 mL Intravenous Q12H  . sodium chloride flush  3 mL Intravenous Q12H   Continuous  Infusions: . sodium chloride Stopped (09/28/19 1933)     LOS: 7 days   The patient is critically ill with multiple organ systems failure and requires high complexity decision making for assessment and support, frequent evaluation and titration of therapies, application of advanced monitoring technologies and extensive interpretation of multiple databases. Critical Care Time devoted to patient care services described in this note  Time spent: 40 minutes     Sabel Hornbeck, Geraldo Docker, MD [facility] Pager [phone]  If 7PM-7AM, please contact night-coverage [email or web address] Password [facility] 10/02/2019, 8:49 AM

## 2019-10-02 NOTE — TOC Initial Note (Signed)
Transition of Care [facility]) - Initial/Assessment Note    Patient Details  Name: Robert Cuevas MRN: [id number] Date of Birth: 1929/10/26  [facility] of [facility]) CM/SW Contact:    Shade Flood, LCSW Phone Number: 10/02/2019, 2:57 PM  Clinical Narrative:                  Pt admitted from home. He lives with his wife in [place]. Pt was independent in ADLS prior to admission. PT recommending SNF at dc. Pt currently on HFNC 20L. TOC will follow and refer out for SNF and insurance auth when pt closer to being medically stable for dc.  Expected Discharge Plan: Skilled Nursing Facility Barriers to Discharge: Continued Medical Work up   Patient Goals and CMS Choice        Expected Discharge Plan and Services Expected Discharge Plan: [place]       Living arrangements for the past 2 months: Single Family Home                                      Prior Living Arrangements/Services Living arrangements for the past 2 months: Single Family Home Lives with:: Spouse Patient language and need for interpreter reviewed:: Yes Do you feel safe going back to the place where you live?: Yes      Need for Family Participation in Patient Care: No (Comment) Care giver support system in place?: Yes (comment)   Criminal Activity/Legal Involvement Pertinent to Current Situation/Hospitalization: No - Comment as needed  Activities of Daily Living Home Assistive Devices/Equipment: Dentures (specify type), Eyeglasses ADL Screening (condition at time of admission) Patient's cognitive ability adequate to safely complete daily activities?: Yes Is the patient deaf or have difficulty hearing?: No Does the patient have difficulty seeing, even when wearing glasses/contacts?: No Does the patient have difficulty concentrating, remembering, or making decisions?: No Patient able to express need for assistance with ADLs?: Yes Does the patient have difficulty dressing or bathing?:  Yes Independently performs ADLs?: No Communication: Independent Does the patient have difficulty walking or climbing stairs?: Yes Weakness of Legs: Both Weakness of Arms/Hands: Both  Permission Sought/Granted                  Emotional Assessment       Orientation: : Oriented to Self, Oriented to Place, Oriented to Situation, Oriented to  Time Alcohol / Substance Use: Not Applicable Psych Involvement: No (comment)  Admission diagnosis:  COVID 19 Patient Active Problem List   Diagnosis Date Noted  . Diabetes mellitus type 2, uncontrolled, with complications ([place]) [id number]  . Acute on chronic respiratory failure with hypoxia ([place]) 09/25/2019  . COVID-19 virus infection 09/25/2019  . Diabetes mellitus type II, non insulin dependent ([place]) 09/25/2019  . Acute respiratory failure due to COVID-19 ([place]) 09/25/2019  . Hyperbilirubinemia 09/25/2019  . Pressure injury of skin 09/25/2019  . Long term (current) use of anticoagulants 02/23/2016  . Paroxysmal atrial fibrillation ([place]) 01/04/2015  . Dyspnea 05/03/2013  . Tachycardia 03/06/2013  . Hypoxia 03/06/2013  . Pacemaker- St Jude  03/06/2013  . 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 Complete heart block ([place]) 09/19/2011   PCP:  [facility] Pharmacy:   [facility] [address], [place] [id number] [place] [id number] Phone: [phone] Fax: [phone]     Social Determinants of Health (SDOH) Interventions  Readmission Risk Interventions Readmission Risk Prevention Plan 10/02/2019  Medication Review (RN CM) Complete  Some recent data might be hidden

## 2019-10-02 NOTE — Plan of Care (Signed)
Discussed plan of care for the evening, pain management and taking a bath tonight with some teach back displayed.  Will update his family later in the evening.

## 2019-10-02 NOTE — Progress Notes (Signed)
Updated daughter at this time and answered questions about the oxygen.

## 2019-10-03 DIAGNOSIS — I9589 Other hypotension: Secondary | ICD-10-CM

## 2019-10-03 DIAGNOSIS — I951 Orthostatic hypotension: Secondary | ICD-10-CM | POA: Diagnosis present

## 2019-10-03 DIAGNOSIS — I959 Hypotension, unspecified: Secondary | ICD-10-CM | POA: Diagnosis present

## 2019-10-03 LAB — CBC WITH DIFFERENTIAL/PLATELET
Abs Immature Granulocytes: 0.08 10*3/uL — ABNORMAL HIGH (ref 0.00–0.07)
Basophils Absolute: 0 10*3/uL (ref 0.0–0.1)
Basophils Relative: 0 %
Eosinophils Absolute: 0.1 10*3/uL (ref 0.0–0.5)
Eosinophils Relative: 1 %
HCT: 54.6 % — ABNORMAL HIGH (ref 39.0–52.0)
Hemoglobin: 17.7 g/dL — ABNORMAL HIGH (ref 13.0–17.0)
Immature Granulocytes: 1 %
Lymphocytes Relative: 4 %
Lymphs Abs: 0.5 10*3/uL — ABNORMAL LOW (ref 0.7–4.0)
MCH: 31.1 pg (ref 26.0–34.0)
MCHC: 32.4 g/dL (ref 30.0–36.0)
MCV: 96 fL (ref 80.0–100.0)
Monocytes Absolute: 0.4 10*3/uL (ref 0.1–1.0)
Monocytes Relative: 3 %
Neutro Abs: 10.8 10*3/uL — ABNORMAL HIGH (ref 1.7–7.7)
Neutrophils Relative %: 91 %
Platelets: 118 10*3/uL — ABNORMAL LOW (ref 150–400)
RBC: 5.69 MIL/uL (ref 4.22–5.81)
RDW: 14.3 % (ref 11.5–15.5)
WBC: 11.9 10*3/uL — ABNORMAL HIGH (ref 4.0–10.5)
nRBC: 0 % (ref 0.0–0.2)

## 2019-10-03 LAB — COMPREHENSIVE METABOLIC PANEL
ALT: 60 U/L — ABNORMAL HIGH (ref 0–44)
AST: 68 U/L — ABNORMAL HIGH (ref 15–41)
Albumin: 2.9 g/dL — ABNORMAL LOW (ref 3.5–5.0)
Alkaline Phosphatase: 156 U/L — ABNORMAL HIGH (ref 38–126)
Anion gap: 12 (ref 5–15)
BUN: 33 mg/dL — ABNORMAL HIGH (ref 8–23)
CO2: 28 mmol/L (ref 22–32)
Calcium: 9 mg/dL (ref 8.9–10.3)
Chloride: 98 mmol/L (ref 98–111)
Creatinine, Ser: 0.63 mg/dL (ref 0.61–1.24)
GFR calc Af Amer: >60 mL/min (ref >60)
GFR calc non Af Amer: >60 mL/min (ref >60)
Glucose, Bld: 96 mg/dL (ref 70–99)
Potassium: 4.7 mmol/L (ref 3.5–5.1)
Sodium: 138 mmol/L (ref 135–145)
Total Bilirubin: 3.4 mg/dL — ABNORMAL HIGH (ref 0.3–1.2)
Total Protein: 6 g/dL — ABNORMAL LOW (ref 6.5–8.1)

## 2019-10-03 LAB — PHOSPHORUS: Phosphorus: 3.3 mg/dL (ref 2.5–4.6)

## 2019-10-03 LAB — FERRITIN: Ferritin: 402 ng/mL — ABNORMAL HIGH (ref 24–336)

## 2019-10-03 LAB — MAGNESIUM: Magnesium: 1.9 mg/dL (ref 1.7–2.4)

## 2019-10-03 LAB — GLUCOSE, CAPILLARY
Glucose-Capillary: 101 mg/dL — ABNORMAL HIGH (ref 70–99)
Glucose-Capillary: 150 mg/dL — ABNORMAL HIGH (ref 70–99)
Glucose-Capillary: 319 mg/dL — ABNORMAL HIGH (ref 70–99)
Glucose-Capillary: 153 mg/dL — ABNORMAL HIGH (ref 70–99)

## 2019-10-03 LAB — D-DIMER, QUANTITATIVE: D-Dimer, Quant: 2.12 ug/mL-FEU — ABNORMAL HIGH (ref 0.00–0.50)

## 2019-10-03 LAB — C-REACTIVE PROTEIN: CRP: 0.5 mg/dL (ref <1.0)

## 2019-10-03 MED ORDER — SODIUM CHLORIDE 0.9 % IV BOLUS
250.0000 mL | Freq: Once | INTRAVENOUS | Status: AC
  Administered 2019-10-03: 250 mL via INTRAVENOUS

## 2019-10-03 MED ORDER — ALBUMIN HUMAN 25 % IV SOLN
50.0000 g | Freq: Once | INTRAVENOUS | Status: AC
  Administered 2019-10-03: 50 g via INTRAVENOUS
  Filled 2019-10-03: qty 50

## 2019-10-03 MED ORDER — METOPROLOL TARTRATE 25 MG PO TABS
12.5000 mg | ORAL_TABLET | Freq: Two times a day (BID) | ORAL | Status: DC
  Administered 2019-10-03 – 2019-10-04 (×3): 12.5 mg via ORAL
  Filled 2019-10-03 (×3): qty 1

## 2019-10-03 NOTE — Progress Notes (Signed)
Updated daughter on progress of patient with lower blood pressure earlier, no further questions at this time.

## 2019-10-03 NOTE — Progress Notes (Addendum)
PROGRESS NOTE    Robert Cuevas  [id number] DOB: 21-Dec-1929 DOA: 09/25/2019 PCP: [facility]   Brief Narrative:  85 y.o.malewith medical history significant forcomplete heart block with pacer, hypertension, paroxysmal atrial fibrillation on Eliquis, chronic hypoxic respiratory failure, and non-insulin-dependent diabetes mellitus, diagnosed with COVID-19 on 09/21/2019 and now presenting with progressive shortness of breath.Patient was seen in the emergency department on 09/21/2019 to request COVID-19 testing because his wife was positive for the virus though the patient denies any symptoms at that time. He since developed progressive shortness of breath and went to [facility] ED for evaluation, patient noted to be profoundly hypoxic, requiring BiPAP 100% at [facility] ED, he was transferred to [facility] for further management   Subjective: 12/14 A/Ox 4 afebrile, A/O x4.  Negative abdominal pain, negative N/V.  Positive S OB but improved   Assessment & Plan:   Principal Problem:   Acute on chronic respiratory failure with hypoxia (HCC) Active Problems:   Complete heart block (HCC)   Paroxysmal atrial fibrillation ([place])   COVID-19 virus infection   Diabetes mellitus type II, non insulin dependent (HCC)   Acute respiratory failure due to COVID-19 ([place])   Hyperbilirubinemia   Pressure injury of skin   Diabetes mellitus type 2, uncontrolled, with complications (HCC)   Hypotension   COVID-19 pneumonia; acute on chronic hypoxic respiratory failure COVID-19 Labs  Recent Labs    10/01/19 0303 10/02/19 0033 10/03/19 0430  DDIMER 5.99* 3.29* 2.12*  FERRITIN 343* 357* 402*  CRP 0.8 0.6 0.5   12/2 POC SARS coronavirus positive -Decadron 6 mg daily -Remdesivir per pharmacy protocol -12/6 transfuse 1 unit Covid convalescent plasma -12/6 Actemra x1 dose -Complete course antibiotics for  CAP -12/10 increased WOB/O2 demand.  PCXR not consistent with worsening Covid  pneumonia. -12/10 procalcitonin pending -12/10 Albumin 25 g + Lasix IV 60 mg  Lactic acidosis -Most likely secondary to yesterday's diuresis. -Procalcitonin not indicative of infection, mild leukocytosis most likely secondary to steroids, negative bands or left shift. -Continue to monitor for secondary infection  Paroxysmal atrial fibrillation -Currently paced; NSR -CHADS-VASc is at least 4 (age x2, HTN, DM) -Continue Eliquis 5 mg  BID  -12/14 decrease Metoprolol 12.5 mg BID  Essential HTN/Hypotension -See A. fib -12/14 Albumin 50 g + normal saline 287ml (Bolus normal saline after albumin administered)  Diabetes type 2 uncontrolled with complication -[id number] hemoglobin A1c= 8.4 -On Metformin at home obviously not managing patient's diabetes.  Would need to increase dose to 1000 mg  BID prior to discharge if patient survives hospitalization  -Levemir 6 units daily -NovoLog 2 units TID -Sensitive SSI  Hyperbilirubinemia -Total bilirubin is 3.2 in ED with normal transaminases and benign abdominal exam -Improving continue to monitor    DVT prophylaxis: Eliquis Code Status: DNR Family Communication: 12/14 spoke with Bertram Millard (wife) counseled on plan of care answered all questions Disposition Plan: TBD   Consultants:    Procedures/Significant Events:  12/10 PCXR; Right worse than left airspace disease persists but has improved   I have personally reviewed and interpreted all radiology studies and my findings are as above.  VENTILATOR SETTINGS: HFNC 12/14 Flow rate; 20 L/min FiO2; 40% SPO2; 93%   Cultures 12/2 POC SARS coronavirus positive    Antimicrobials: Anti-infectives (From admission, onward)   Start     Dose/Rate Stop   09/26/19 1000  remdesivir 100 mg in sodium chloride 0.9 % 100 mL IVPB  Status:  Discontinued     100 mg 200 mL/hr over  30 Minutes 09/25/19 0406   09/25/19 1100  azithromycin (ZITHROMAX) 500 mg in sodium chloride 0.9 % 250 mL IVPB      500 mg 250 mL/hr over 60 Minutes 09/28/19 1933   09/25/19 1000  remdesivir 100 mg in sodium chloride 0.9 % 100 mL IVPB     100 mg 200 mL/hr over 30 Minutes 09/28/19 1933   09/25/19 1000  cefTRIAXone (ROCEPHIN) 2 g in sodium chloride 0.9 % 100 mL IVPB     2 g 200 mL/hr over 30 Minutes 09/28/19 1933   09/25/19 0400  remdesivir 200 mg in sodium chloride 0.9% 250 mL IVPB  Status:  Discontinued     200 mg 580 mL/hr over 30 Minutes 09/25/19 0406       Devices    LINES / TUBES:      Continuous Infusions: . sodium chloride Stopped (09/28/19 1933)  . albumin human    . sodium chloride       Objective: Vitals:   10/03/19 1200 10/03/19 1300 10/03/19 1321 10/03/19 1400  BP:   (!) 79/42 (!) 104/43  Pulse: 68 70 69 70  Resp: (!) 21 (!) 27 (!) 23 (!) 32  Temp:      TempSrc:      SpO2: 90% 92% 93% 93%  Weight:      Height:        Intake/Output Summary (Last 24 hours) at 10/03/2019 1541 Last data filed at 10/03/2019 1200 Gross per 24 hour  Intake 610 ml  Output 1725 ml  Net -1115 ml   Filed Weights   09/25/19 0500  Weight: 60.8 kg   Physical Exam:  General: A/O x4, positive acute respiratory distress, cachectic Eyes: negative scleral hemorrhage, negative anisocoria, negative icterus ENT: Negative Runny nose, negative gingival bleeding, Neck:  Negative scars, masses, torticollis, lymphadenopathy, JVD Lungs: Clear to auscultation bilaterally without wheezes or crackles Cardiovascular: (Paced rhythm) without murmur gallop or rub normal S1 and S2 Abdomen: negative abdominal pain, nondistended, positive soft, bowel sounds, no rebound, no ascites, no appreciable mass Extremities: No significant cyanosis, clubbing, or edema bilateral lower extremities Skin: Negative rashes, lesions, ulcers Psychiatric:  Negative depression, negative anxiety, negative fatigue, negative mania  Central nervous system:  Cranial nerves II through XII intact, tongue/uvula midline, all extremities  muscle strength 5/5, sensation intact throughout, negative dysarthria, negative expressive aphasia, negative receptive aphasia. .     Data Reviewed: Care during the described time interval was provided by me .  I have reviewed this patient's available data, including medical history, events of note, physical examination, and all test results as part of my evaluation.   CBC: Recent Labs  Lab 09/29/19 0012 09/30/19 0524 10/01/19 0303 10/02/19 0033 10/03/19 0430  WBC 10.0 11.5* 12.6* 13.4* 11.9*  NEUTROABS 9.0* 10.6* 11.6* 12.5* 10.8*  HGB 16.8 17.8* 17.4* 17.1* 17.7*  HCT 50.6 53.6* 53.2* 51.1 54.6*  MCV 95.3 95.5 95.3 96.1 96.0  PLT 168 152 137* 122* [id number]*   Basic Metabolic Panel: Recent Labs  Lab 09/29/19 0012 09/30/19 0524 10/01/19 0303 10/02/19 0033 10/03/19 0430  NA 138 138 137 137 138  K 4.4 3.9 4.8 4.6 4.7  CL 102 94* 94* 98 98  CO2 24 28 30 28 28   GLUCOSE 91 102* 145* 89 96  BUN 28* 32* 35* 30* 33*  CREATININE 0.59* 0.62 0.58* 0.56* 0.63  CALCIUM 8.5* 9.1 8.9 8.9 9.0  MG 1.8 1.8 1.9 1.8 1.9  PHOS 2.6 3.4 3.5 2.9 3.3  GFR: Estimated Creatinine Clearance: 55.9 mL/min (by C-G formula based on SCr of 0.63 mg/dL). Liver Function Tests: Recent Labs  Lab 09/29/19 0012 09/30/19 0524 10/01/19 0303 10/02/19 0033 10/03/19 0430  AST 50* 61* 61* 61* 68*  ALT 28 36 42 45* 60*  ALKPHOS 130* 145* 140* 145* 156*  BILITOT 1.8* 3.1* 3.4* 2.9* 3.4*  PROT 5.8* 6.3* 6.1* 6.0* 6.0*  ALBUMIN 2.5* 3.2* 3.1* 3.0* 2.9*   No results for input(s): LIPASE, AMYLASE in the last 168 hours. No results for input(s): AMMONIA in the last 168 hours. Coagulation Profile: No results for input(s): INR, PROTIME in the last 168 hours. Cardiac Enzymes: No results for input(s): CKTOTAL, CKMB, CKMBINDEX, TROPONINI in the last 168 hours. BNP (last 3 results) No results for input(s): PROBNP in the last 8760 hours. HbA1C: No results for input(s): HGBA1C in the last 72 hours. CBG: Recent  Labs  Lab 10/02/19 1206 10/02/19 1641 10/02/19 2033 10/03/19 0731 10/03/19 1132  GLUCAP 258* 240* 242* 101* 150*   Lipid Profile: No results for input(s): CHOL, HDL, LDLCALC, TRIG, CHOLHDL, LDLDIRECT in the last 72 hours. Thyroid Function Tests: No results for input(s): TSH, T4TOTAL, FREET4, T3FREE, THYROIDAB in the last 72 hours. Anemia Panel: Recent Labs    10/02/19 0033 10/03/19 0430  FERRITIN 357* 402*   Urine analysis: No results found for: COLORURINE, APPEARANCEUR, LABSPEC, PHURINE, GLUCOSEU, HGBUR, BILIRUBINUR, KETONESUR, PROTEINUR, UROBILINOGEN, NITRITE, LEUKOCYTESUR Sepsis Labs: @LABRCNTIP (procalcitonin:4,lacticidven:4)  ) Recent Results (from the past 240 hour(s))  MRSA PCR Screening     Status: None   Collection Time: 09/25/19  4:33 AM   Specimen: Nasal Mucosa; Nasopharyngeal  Result Value Ref Range Status   MRSA by PCR NEGATIVE NEGATIVE Final    Comment:        The GeneXpert MRSA Assay (FDA approved for NASAL specimens only), is one component of a comprehensive MRSA colonization surveillance program. It is not intended to diagnose MRSA infection nor to guide or monitor treatment for MRSA infections. Performed at [facility], [place] [address], [place], [place] [address]          Radiology Studies: No results found.      Scheduled Meds: . apixaban  5 mg Oral BID  . atorvastatin  20 mg Oral q1800  . Chlorhexidine Gluconate Cloth  6 each Topical Daily  . dexamethasone (DECADRON) injection  6 mg Intravenous Q24H  . famotidine  20 mg Oral BID  . feeding supplement (ENSURE ENLIVE)  237 mL Oral TID BM  . insulin aspart  0-9 Units Subcutaneous TID WC  . insulin aspart  2 Units Subcutaneous TID WC  . insulin detemir  6 Units Subcutaneous Daily  . mouth rinse  15 mL Mouth Rinse BID  . metoprolol tartrate  12.5 mg Oral BID  . pneumococcal 23 valent vaccine  0.5 mL Intramuscular Tomorrow-1000  . ramipril  2.5 mg Oral Daily  .  sodium chloride flush  3 mL Intravenous Q12H  . sodium chloride flush  3 mL Intravenous Q12H   Continuous Infusions: . sodium chloride Stopped (09/28/19 1933)  . albumin human    . sodium chloride       LOS: 8 days   The patient is critically ill with multiple organ systems failure and requires high complexity decision making for assessment and support, frequent evaluation and titration of therapies, application of advanced monitoring technologies and extensive interpretation of multiple databases. Critical Care Time devoted to patient care services described in this note  Time spent: 40 minutes     Braden Cimo, Geraldo Docker, MD [facility] Pager [phone]  If 7PM-7AM, please contact night-coverage [email or web address] Password [email or web address] 10/03/2019, 3:41 PM

## 2019-10-03 NOTE — Plan of Care (Signed)
Pt remained in bed throughout the shift. PT unable to get him up to the chair due to hypotension. No s/s of pain. Currently on heated HFNC 20L and 40%. Family was updated and all questions were answered.   Problem: Education: Goal: Knowledge of risk factors and measures for prevention of condition will improve 10/03/2019 1531 by Myna Bright, RN Outcome: Progressing 10/03/2019 1530 by Myna Bright, RN Outcome: Progressing 10/03/2019 1522 by Myna Bright, RN Outcome: Progressing   Problem: Coping: Goal: Psychosocial and spiritual needs will be supported 10/03/2019 1531 by Myna Bright, RN Outcome: Progressing 10/03/2019 1530 by Myna Bright, RN Outcome: Progressing 10/03/2019 1522 by Myna Bright, RN Outcome: Progressing   Problem: Respiratory: Goal: Will maintain a patent airway 10/03/2019 1531 by Myna Bright, RN Outcome: Progressing 10/03/2019 1530 by Myna Bright, RN Outcome: Progressing 10/03/2019 1522 by Myna Bright, RN Outcome: Progressing Goal: Complications related to the disease process, condition or treatment will be avoided or minimized 10/03/2019 1531 by Myna Bright, RN Outcome: Progressing 10/03/2019 1530 by Myna Bright, RN Outcome: Progressing 10/03/2019 1522 by Myna Bright, RN Outcome: Progressing   Problem: Health Behavior/Discharge Planning: Goal: Ability to manage health-related needs will improve 10/03/2019 1531 by Myna Bright, RN Outcome: Progressing 10/03/2019 1530 by Myna Bright, RN Outcome: Progressing 10/03/2019 1522 by Myna Bright, RN Outcome: Progressing   Problem: Clinical Measurements: Goal: Ability to maintain clinical measurements within normal limits will improve 10/03/2019 1531 by Myna Bright, RN Outcome: Progressing 10/03/2019 1530 by Myna Bright, RN Outcome: Progressing 10/03/2019 1522 by Myna Bright, RN Outcome: Progressing Goal: Will remain free from  infection 10/03/2019 1531 by Myna Bright, RN Outcome: Progressing 10/03/2019 1530 by Myna Bright, RN Outcome: Progressing 10/03/2019 1522 by Myna Bright, RN Outcome: Progressing Goal: Diagnostic test results will improve 10/03/2019 1531 by Myna Bright, RN Outcome: Progressing 10/03/2019 1530 by Myna Bright, RN Outcome: Progressing 10/03/2019 1522 by Myna Bright, RN Outcome: Progressing Goal: Respiratory complications will improve 10/03/2019 1531 by Myna Bright, RN Outcome: Progressing 10/03/2019 1530 by Myna Bright, RN Outcome: Progressing 10/03/2019 1522 by Myna Bright, RN Outcome: Progressing Goal: Cardiovascular complication will be avoided 10/03/2019 1531 by Myna Bright, RN Outcome: Progressing 10/03/2019 1530 by Myna Bright, RN Outcome: Progressing 10/03/2019 1522 by Myna Bright, RN Outcome: Progressing   Problem: Activity: Goal: Risk for activity intolerance will decrease 10/03/2019 1531 by Myna Bright, RN Outcome: Progressing 10/03/2019 1530 by Myna Bright, RN Outcome: Progressing 10/03/2019 1522 by Myna Bright, RN Outcome: Progressing   Problem: Coping: Goal: Level of anxiety will decrease 10/03/2019 1531 by Myna Bright, RN Outcome: Progressing 10/03/2019 1530 by Myna Bright, RN Outcome: Progressing 10/03/2019 1522 by Myna Bright, RN Outcome: Progressing

## 2019-10-03 NOTE — Plan of Care (Signed)
Discussed with patient plan of care for the evening, pain management and low blood pressure with some teach back displayed.  Will update daughter later in the evening.

## 2019-10-03 NOTE — Progress Notes (Signed)
Physical Therapy Treatment Patient Details Name: Robert Cuevas MRN: [id number] DOB: 09/25/1930 Today's Date: 10/03/2019    History of Present Illness Pt is an 85 y.o. male admitted 09/25/19 with worsening SOB; initially tested (+) COVID-19 on 09/21/19. PMH includes afib, DM2, HTN.    PT Comments    RN in room on arrival with pt's BP 88/45 (58). Agreed ok to attempt supine gentle exercises. Patient initially with improved MAP to 67, however as exercises continued and elevated HOB 30 to 40, his MAP again dropped to 57. HOB returned to 30 degrees and further activity deferred.    Follow Up Recommendations  SNF;Supervision for mobility/OOB     Equipment Recommendations  Other (comment)(TBD)    Recommendations for Other Services       Precautions / Restrictions Precautions Precautions: Fall Precaution Comments: [place] Restrictions Weight Bearing Restrictions: No    Mobility  Bed Mobility               General bed mobility comments: deferred due to hypotension with MAP <60  Transfers                    Ambulation/Gait                 Stairs             Wheelchair Mobility    Modified Rankin (Stroke Patients Only)       Balance                                            Cognition Arousal/Alertness: Awake/alert Behavior During Therapy: WFL for tasks assessed/performed Overall Cognitive Status: No family/caregiver present to determine baseline cognitive functioning                                 General Comments: difficult to understand at times, but not always "on topic"      Exercises Other Exercises Other Exercises: supine ankle pumps, hand pumps, AAROM hip abduction x 5 reps each and repeated BP with MAP incr 58 to 67; supine heelslides with resisted extension x 5 each leg, resisted triceps extension, and elevated HOB 30 to 40; repeated BP with MAP down to 58 again. Further activiity deferred.     General Comments        Pertinent Vitals/Pain Pain Assessment: No/denies pain    Home Living Family/patient expects to be discharged to:: Private residence Living Arrangements: Spouse/significant other Available Help at Discharge: Family;Available 24 hours/day Type of Home: House Home Access: Stairs to enter   Home Layout: One level Home Equipment: Cane - single point;Shower seat - built in Additional Comments: Reports wife indep and "in good health since she's only 84"    Prior Function Level of Independence: Independent with assistive device(s)      Comments: Reports mod indep with intermittent use of SPC   PT Goals (current goals can now be found in the care plan section) Acute Rehab PT Goals Patient Stated Goal: Get home Time For Goal Achievement: 10/12/19 Potential to Achieve Goals: Fair Progress towards PT goals: Not progressing toward goals - comment(hypotensive )    Frequency    Min 2X/week      PT Plan Current plan remains appropriate    Co-evaluation  AM-PAC PT "6 Clicks" Mobility   Outcome Measure  Help needed turning from your back to your side while in a flat bed without using bedrails?: A Lot Help needed moving from lying on your back to sitting on the side of a flat bed without using bedrails?: A Lot Help needed moving to and from a bed to a chair (including a wheelchair)?: A Lot Help needed standing up from a chair using your arms (e.g., wheelchair or bedside chair)?: A Lot Help needed to walk in hospital room?: A Lot Help needed climbing 3-5 steps with a railing? : Total 6 Click Score: 11    End of Session Equipment Utilized During Treatment: Oxygen Activity Tolerance: Treatment limited secondary to medical complications (Comment)(hypotension) Patient left: with call bell/phone within reach;in bed;with bed alarm set Nurse Communication: Other (comment)(pt remained too hypotensive for any mobility; ex's only) PT Visit  Diagnosis: Other abnormalities of gait and mobility (R26.89);Muscle weakness (generalized) (M62.81)     Time: [id number] PT Time Calculation (min) (ACUTE ONLY): 27 min  Charges:  $Therapeutic Exercise: 23-37 mins                      Barry Brunner, PT Pager [phone]    Rexanne Mano 10/03/2019, 6:21 PM

## 2019-10-04 LAB — COMPREHENSIVE METABOLIC PANEL
ALT: 72 U/L — ABNORMAL HIGH (ref 0–44)
AST: 75 U/L — ABNORMAL HIGH (ref 15–41)
Albumin: 3.2 g/dL — ABNORMAL LOW (ref 3.5–5.0)
Alkaline Phosphatase: 143 U/L — ABNORMAL HIGH (ref 38–126)
Anion gap: 10 (ref 5–15)
BUN: 34 mg/dL — ABNORMAL HIGH (ref 8–23)
CO2: 27 mmol/L (ref 22–32)
Calcium: 8.7 mg/dL — ABNORMAL LOW (ref 8.9–10.3)
Chloride: 100 mmol/L (ref 98–111)
Creatinine, Ser: 0.67 mg/dL (ref 0.61–1.24)
GFR calc Af Amer: >60 mL/min (ref >60)
GFR calc non Af Amer: >60 mL/min (ref >60)
Glucose, Bld: 137 mg/dL — ABNORMAL HIGH (ref 70–99)
Potassium: 4.9 mmol/L (ref 3.5–5.1)
Sodium: 137 mmol/L (ref 135–145)
Total Bilirubin: 3.5 mg/dL — ABNORMAL HIGH (ref 0.3–1.2)
Total Protein: 5.9 g/dL — ABNORMAL LOW (ref 6.5–8.1)

## 2019-10-04 LAB — CBC WITH DIFFERENTIAL/PLATELET
Abs Immature Granulocytes: 0.06 10*3/uL (ref 0.00–0.07)
Basophils Absolute: 0 10*3/uL (ref 0.0–0.1)
Basophils Relative: 0 %
Eosinophils Absolute: 0 10*3/uL (ref 0.0–0.5)
Eosinophils Relative: 0 %
HCT: 52 % (ref 39.0–52.0)
Hemoglobin: 17.2 g/dL — ABNORMAL HIGH (ref 13.0–17.0)
Immature Granulocytes: 1 %
Lymphocytes Relative: 5 %
Lymphs Abs: 0.5 10*3/uL — ABNORMAL LOW (ref 0.7–4.0)
MCH: 31.6 pg (ref 26.0–34.0)
MCHC: 33.1 g/dL (ref 30.0–36.0)
MCV: 95.4 fL (ref 80.0–100.0)
Monocytes Absolute: 0.4 10*3/uL (ref 0.1–1.0)
Monocytes Relative: 4 %
Neutro Abs: 9.7 10*3/uL — ABNORMAL HIGH (ref 1.7–7.7)
Neutrophils Relative %: 90 %
Platelets: 103 10*3/uL — ABNORMAL LOW (ref 150–400)
RBC: 5.45 MIL/uL (ref 4.22–5.81)
RDW: 14.4 % (ref 11.5–15.5)
WBC: 10.7 10*3/uL — ABNORMAL HIGH (ref 4.0–10.5)
nRBC: 0 % (ref 0.0–0.2)

## 2019-10-04 LAB — GLUCOSE, CAPILLARY
Glucose-Capillary: 144 mg/dL — ABNORMAL HIGH (ref 70–99)
Glucose-Capillary: 217 mg/dL — ABNORMAL HIGH (ref 70–99)
Glucose-Capillary: 331 mg/dL — ABNORMAL HIGH (ref 70–99)
Glucose-Capillary: 127 mg/dL — ABNORMAL HIGH (ref 70–99)

## 2019-10-04 LAB — PHOSPHORUS: Phosphorus: 3.4 mg/dL (ref 2.5–4.6)

## 2019-10-04 LAB — C-REACTIVE PROTEIN: CRP: 0.6 mg/dL (ref <1.0)

## 2019-10-04 LAB — D-DIMER, QUANTITATIVE: D-Dimer, Quant: 1.53 ug/mL-FEU — ABNORMAL HIGH (ref 0.00–0.50)

## 2019-10-04 LAB — FERRITIN: Ferritin: 460 ng/mL — ABNORMAL HIGH (ref 24–336)

## 2019-10-04 LAB — MAGNESIUM: Magnesium: 1.9 mg/dL (ref 1.7–2.4)

## 2019-10-04 MED ORDER — INSULIN ASPART 100 UNIT/ML ~~LOC~~ SOLN
4.0000 [IU] | Freq: Three times a day (TID) | SUBCUTANEOUS | Status: DC
  Administered 2019-10-05 – 2019-10-08 (×9): 4 [IU] via SUBCUTANEOUS

## 2019-10-04 MED ORDER — ALBUMIN HUMAN 25 % IV SOLN
25.0000 g | Freq: Once | INTRAVENOUS | Status: AC
  Administered 2019-10-04: 25 g via INTRAVENOUS
  Filled 2019-10-04: qty 50

## 2019-10-04 MED ORDER — INSULIN DETEMIR 100 UNIT/ML ~~LOC~~ SOLN
8.0000 [IU] | Freq: Every day | SUBCUTANEOUS | Status: DC
  Administered 2019-10-05 – 2019-10-08 (×4): 8 [IU] via SUBCUTANEOUS
  Filled 2019-10-04 (×4): qty 0.08

## 2019-10-04 NOTE — Progress Notes (Signed)
Called  And updated Robert Cuevas (Daughter) about her father's current medical status. All question were answered and she spoke with her father via telephone.

## 2019-10-04 NOTE — TOC Progression Note (Signed)
Transition of Care [facility]) - Progression Note    Patient Details  Name: Robert Cuevas MRN: [id number] Date of Birth: Apr 14, 1930  [facility] CM/SW Contact  Joaquin Courts, RN Phone Number: 10/04/2019, 10:49 AM  Clinical Narrative:    CM called patient's spouse and discussed recommendations for SNF for short term rehab. Spouse is agreeable to SNF search. CM discussed placement process and received permission to send FL2 to area facilities.  FL2 faxed out, will await bed offers.     Expected Discharge Plan: [place] Barriers to Discharge: Continued Medical Work up  Expected Discharge Plan and Services Expected Discharge Plan: [place] Choice: [place] arrangements for the past 2 months: Single Family Home                                       Social Determinants of Health (SDOH) Interventions    Readmission Risk Interventions Readmission Risk Prevention Plan 10/02/2019  Medication Review (RN CM) Complete  Some recent data might be hidden

## 2019-10-04 NOTE — NC FL2 (Signed)
[place] LEVEL OF CARE SCREENING TOOL     IDENTIFICATION  Patient Name: Robert Cuevas Birthdate: 12-16-1929 Sex: male Admission Date (Current Location): 09/25/2019  [facility] and [place] Number:  Herbalist and Address:  [facility] [place], [place], [place])      Provider Number: [id number]  Attending Physician Name and Address:  Allie Bossier, MD  Relative Name and Phone Number:       Current Level of Care: Hospital Recommended Level of Care: [place] Prior Approval Number:    Date Approved/Denied:   PASRR Number: [id number]  Discharge Plan: SNF    Current Diagnoses: Patient Active Problem List   Diagnosis Date Noted  . Hypotension 10/03/2019  . Diabetes mellitus type 2, uncontrolled, with complications ([place]) [id number]  . Acute on chronic respiratory failure with hypoxia ([place]) 09/25/2019  . COVID-19 virus infection 09/25/2019  . Diabetes mellitus type II, non insulin dependent ([place]) 09/25/2019  . Acute respiratory failure due to COVID-19 ([place]) 09/25/2019  . Hyperbilirubinemia 09/25/2019  . Pressure injury of skin 09/25/2019  . Long term (current) use of anticoagulants 02/23/2016  . Paroxysmal atrial fibrillation ([place]) 01/04/2015  . Dyspnea 05/03/2013  . Tachycardia 03/06/2013  . Hypoxia 03/06/2013  . Pacemaker- St Jude  03/06/2013  . Infection of pacemaker pocket ([place]) 02/22/2013  . Complete heart block ([place]) 09/19/2011    Orientation RESPIRATION BLADDER Height & Weight     Self, Time, Situation, Place  O2 Continent Weight: 60.8 kg Height:  5\' 8"  (172.7 cm)  BEHAVIORAL SYMPTOMS/MOOD NEUROLOGICAL BOWEL NUTRITION STATUS      Continent Diet(see dc summary)  AMBULATORY STATUS COMMUNICATION OF NEEDS Skin   Extensive Assist Verbally Normal                       Personal Care Assistance Level of Assistance  Bathing, Feeding, Dressing Bathing  Assistance: Limited assistance Feeding assistance: Independent Dressing Assistance: Limited assistance     Functional Limitations Info  Sight, Hearing, Speech Sight Info: Adequate Hearing Info: Impaired Speech Info: Adequate    SPECIAL CARE FACTORS FREQUENCY  PT (By licensed PT), OT (By licensed OT)     PT Frequency: 5 times weekly OT Frequency: 3 times weekly            Contractures Contractures Info: Not present    Additional Factors Info  Code Status, Allergies Code Status Info: DNR Allergies Info: NKA           Current Medications (10/04/2019):  This is the current hospital active medication list Current Facility-Administered Medications  Medication Dose Route Frequency Provider Last Rate Last Admin  . 0.9 %  sodium chloride infusion  250 mL Intravenous PRN Opyd, Ilene Qua, MD   Stopped at 09/28/19 1933  . acetaminophen (TYLENOL) tablet 650 mg  650 mg Oral Q6H PRN Vianne Bulls, MD   650 mg at 10/03/19 2239  . apixaban (ELIQUIS) tablet 5 mg  5 mg Oral BID Opyd, Ilene Qua, MD   5 mg at 10/04/19 0939  . atorvastatin (LIPITOR) tablet 20 mg  20 mg Oral q1800 Opyd, Ilene Qua, MD   20 mg at 10/03/19 1755  . Chlorhexidine Gluconate Cloth 2 % PADS 6 each  6 each Topical Daily Elgergawy, Silver Huguenin, MD   6 each at 10/03/19 1700  . famotidine (PEPCID) tablet 20 mg  20 mg Oral BID Elgergawy, Silver Huguenin, MD  20 mg at 10/04/19 0939  . feeding supplement (ENSURE ENLIVE) (ENSURE ENLIVE) liquid 237 mL  237 mL Oral TID BM Elgergawy, Silver Huguenin, MD   237 mL at 10/04/19 0951  . guaiFENesin-dextromethorphan (ROBITUSSIN DM) 100-10 MG/5ML syrup 10 mL  10 mL Oral Q4H PRN Opyd, Ilene Qua, MD   10 mL at 09/28/19 0806  . insulin aspart (novoLOG) injection 0-9 Units  0-9 Units Subcutaneous TID WC Opyd, Ilene Qua, MD   1 Units at 10/04/19 [phone]  . insulin aspart (novoLOG) injection 2 Units  2 Units Subcutaneous TID WC Elgergawy, Silver Huguenin, MD   2 Units at 10/04/19 [phone]  . insulin detemir (LEVEMIR)  injection 6 Units  6 Units Subcutaneous Daily Elgergawy, Silver Huguenin, MD   6 Units at 10/04/19 [phone]  . lip balm (CARMEX) ointment   Topical PRN Allie Bossier, MD      . MEDLINE mouth rinse  15 mL Mouth Rinse BID Opyd, Ilene Qua, MD   15 mL at 10/04/19 0952  . metoprolol tartrate (LOPRESSOR) tablet 12.5 mg  12.5 mg Oral BID Allie Bossier, MD   12.5 mg at 10/04/19 [id number]  . ondansetron (ZOFRAN) tablet 4 mg  4 mg Oral Q6H PRN Opyd, Ilene Qua, MD       Or  . ondansetron (ZOFRAN) injection 4 mg  4 mg Intravenous Q6H PRN Opyd, Ilene Qua, MD      . pneumococcal 23 valent vaccine (PNEUMOVAX-23) injection 0.5 mL  0.5 mL Intramuscular Tomorrow-1000 Allie Bossier, MD   Stopped at 09/29/19 0911  . ramipril (ALTACE) capsule 2.5 mg  2.5 mg Oral Daily Opyd, Ilene Qua, MD   2.5 mg at 10/04/19 0939  . sodium chloride (OCEAN) 0.65 % nasal spray 1 spray  1 spray Each Nare PRN Allie Bossier, MD   1 spray at 09/28/19 2126  . sodium chloride flush (NS) 0.9 % injection 3 mL  3 mL Intravenous Q12H Opyd, Ilene Qua, MD   3 mL at 10/04/19 0953  . sodium chloride flush (NS) 0.9 % injection 3 mL  3 mL Intravenous Q12H Opyd, Ilene Qua, MD   3 mL at 10/04/19 0953  . sodium chloride flush (NS) 0.9 % injection 3 mL  3 mL Intravenous PRN Opyd, Ilene Qua, MD      . white petrolatum (VASELINE) gel   Topical PRN Allie Bossier, MD   0.2 application at [phone]     Discharge Medications: Please see discharge summary for a list of discharge medications.  Relevant Imaging Results:  Relevant Lab Results:   Additional Information SSN: [place]  Alvira Monday Hassan [place], [place]

## 2019-10-04 NOTE — Progress Notes (Signed)
PROGRESS NOTE    Robert Cuevas  [id number] DOB: [birth year] DOA: 09/25/2019 PCP: [facility]   Brief Narrative:  85 y.o.malewith medical history significant forcomplete heart block with pacer, hypertension, paroxysmal atrial fibrillation on Eliquis, chronic hypoxic respiratory failure, and non-insulin-dependent diabetes mellitus, diagnosed with COVID-19 on 09/21/2019 and now presenting with progressive shortness of breath.Patient was seen in the emergency department on 09/21/2019 to request COVID-19 testing because his wife was positive for the virus though the patient denies any symptoms at that time. He since developed progressive shortness of breath and went to [facility] ED for evaluation, patient noted to be profoundly hypoxic, requiring BiPAP 100% at [facility] ED, he was transferred to [facility] for further management   Subjective: 12/15 last 24 hours afebrile A/O x4, negative abdominal pain.  Negative N/V.  Positive S OB but now on room air   Assessment & Plan:   Principal Problem:   Acute on chronic respiratory failure with hypoxia (HCC) Active Problems:   Complete heart block (HCC)   Paroxysmal atrial fibrillation ([place])   COVID-19 virus infection   Diabetes mellitus type II, non insulin dependent (HCC)   Acute respiratory failure due to COVID-19 ([place])   Hyperbilirubinemia   Pressure injury of skin   Diabetes mellitus type 2, uncontrolled, with complications (HCC)   Hypotension   COVID-19 pneumonia; acute on chronic hypoxic respiratory failure COVID-19 Labs  Recent Labs    10/02/19 0033 10/03/19 0430 10/04/19 0350  DDIMER 3.29* 2.12* 1.53*  FERRITIN 357* 402* 460*  CRP 0.6 0.5 0.6   12/2 POC SARS coronavirus positive -Decadron 6 mg daily -Remdesivir per pharmacy protocol -12/6 transfuse 1 unit Covid convalescent plasma -12/6 Actemra x1 dose -Complete course antibiotics for  CAP -12/10 increased WOB/O2 demand.  PCXR not consistent with worsening  Covid pneumonia. -12/10 procalcitonin pending -12/10 Albumin 25 g + Lasix IV 60 mg  Lactic acidosis -Most likely secondary to yesterday's diuresis. -Procalcitonin not indicative of infection, mild leukocytosis most likely secondary to steroids, negative bands or left shift. -Continue to monitor for secondary infection  Paroxysmal atrial fibrillation -Currently paced; NSR -CHADS-VASc is at least 4 (age x2, HTN, DM) -Continue Eliquis 5 mg  BID  -12/14 decrease Metoprolol 12.5 mg BID will DC if patient's BP continues to be soft  Essential HTN/Hypotension -See A. fib -12/14 Albumin 50 g + normal saline 224ml (Bolus normal saline after albumin administered) -12/15 Albumin 25 g    Diabetes type 2 uncontrolled with complication -[id number] hemoglobin A1c= 8.4 -On Metformin at home obviously not managing patient's diabetes.  Would need to increase dose to 1000 mg  BID prior to discharge if patient survives hospitalization  -12/15 increase Levemir 8 units daily  -12/15 increase NovoLog 6 units qac  -Sensitive SSI  Hyperbilirubinemia -Total bilirubin is 3.2 in ED with normal transaminases and benign abdominal exam -Improving continue to monitor    DVT prophylaxis: Eliquis Code Status: DNR Family Communication: 12/14 spoke with Bertram Millard (wife) counseled on plan of care answered all questions Disposition Plan: TBD   Consultants:    Procedures/Significant Events:  12/10 PCXR; Right worse than left airspace disease persists but has improved   I have personally reviewed and interpreted all radiology studies and my findings are as above.  VENTILATOR SETTINGS: Room air 12/15 SPO2 98%   Cultures 12/2 POC SARS coronavirus positive    Antimicrobials: Anti-infectives (From admission, onward)   Start     Dose/Rate Stop   09/26/19 1000  remdesivir 100 mg in  sodium chloride 0.9 % 100 mL IVPB  Status:  Discontinued     100 mg 200 mL/hr over 30 Minutes 09/25/19 0406   09/25/19  1100  azithromycin (ZITHROMAX) 500 mg in sodium chloride 0.9 % 250 mL IVPB     500 mg 250 mL/hr over 60 Minutes 09/28/19 1933   09/25/19 1000  remdesivir 100 mg in sodium chloride 0.9 % 100 mL IVPB     100 mg 200 mL/hr over 30 Minutes 09/28/19 1933   09/25/19 1000  cefTRIAXone (ROCEPHIN) 2 g in sodium chloride 0.9 % 100 mL IVPB     2 g 200 mL/hr over 30 Minutes 09/28/19 1933   09/25/19 0400  remdesivir 200 mg in sodium chloride 0.9% 250 mL IVPB  Status:  Discontinued     200 mg 580 mL/hr over 30 Minutes 09/25/19 0406       Devices    LINES / TUBES:      Continuous Infusions: . sodium chloride Stopped (09/28/19 1933)     Objective: Vitals:   10/04/19 1154 10/04/19 1200 10/04/19 1300 10/04/19 1400  BP:  (!) 107/40 (!) 98/44 (!) 100/52  Pulse:  70 70 70  Resp:  (!) 29 19 20   Temp: 97.6 F (36.4 C)     TempSrc: Oral     SpO2:  92% 99% 98%  Weight:      Height:        Intake/Output Summary (Last 24 hours) at 10/04/2019 1450 Last data filed at 10/04/2019 0600 Gross per 24 hour  Intake 822.5 ml  Output 775 ml  Net 47.5 ml   Filed Weights   09/25/19 0500  Weight: 60.8 kg   Physical Exam:  General: A/O x4, positive acute respiratory distress, cachectic Eyes: negative scleral hemorrhage, negative anisocoria, negative icterus ENT: Negative Runny nose, negative gingival bleeding, Neck:  Negative scars, masses, torticollis, lymphadenopathy, JVD Lungs: Clear to auscultation bilaterally without wheezes or crackles Cardiovascular: (Paced rhythm) without murmur gallop or rub normal S1 and S2 Abdomen: negative abdominal pain, nondistended, positive soft, bowel sounds, no rebound, no ascites, no appreciable mass Extremities: No significant cyanosis, clubbing, or edema bilateral lower extremities Skin: Negative rashes, lesions, ulcers Psychiatric:  Negative depression, negative anxiety, negative fatigue, negative mania  Central nervous system:  Cranial nerves II through  XII intact, tongue/uvula midline, all extremities muscle strength 5/5, sensation intact throughout, negative dysarthria, negative expressive aphasia, negative receptive aphasia.      Data Reviewed: Care during the described time interval was provided by me .  I have reviewed this patient's available data, including medical history, events of note, physical examination, and all test results as part of my evaluation.   CBC: Recent Labs  Lab 09/30/19 0524 10/01/19 0303 10/02/19 0033 10/03/19 0430 10/04/19 0350  WBC 11.5* 12.6* 13.4* 11.9* 10.7*  NEUTROABS 10.6* 11.6* 12.5* 10.8* 9.7*  HGB 17.8* 17.4* 17.1* 17.7* 17.2*  HCT 53.6* 53.2* 51.1 54.6* 52.0  MCV 95.5 95.3 96.1 96.0 95.4  PLT 152 137* 122* 118* [id number]*   Basic Metabolic Panel: Recent Labs  Lab 09/30/19 0524 10/01/19 0303 10/02/19 0033 10/03/19 0430 10/04/19 0350  NA 138 137 137 138 137  K 3.9 4.8 4.6 4.7 4.9  CL 94* 94* 98 98 100  CO2 28 30 28 28 27   GLUCOSE 102* 145* 89 96 137*  BUN 32* 35* 30* 33* 34*  CREATININE 0.62 0.58* 0.56* 0.63 0.67  CALCIUM 9.1 8.9 8.9 9.0 8.7*  MG 1.8 1.9 1.8 1.9 1.9  PHOS 3.4 3.5 2.9 3.3 3.4   GFR: Estimated Creatinine Clearance: 55.9 mL/min (by C-G formula based on SCr of 0.67 mg/dL). Liver Function Tests: Recent Labs  Lab 09/30/19 0524 10/01/19 0303 10/02/19 0033 10/03/19 0430 10/04/19 0350  AST 61* 61* 61* 68* 75*  ALT 36 42 45* 60* 72*  ALKPHOS 145* 140* 145* 156* 143*  BILITOT 3.1* 3.4* 2.9* 3.4* 3.5*  PROT 6.3* 6.1* 6.0* 6.0* 5.9*  ALBUMIN 3.2* 3.1* 3.0* 2.9* 3.2*   No results for input(s): LIPASE, AMYLASE in the last 168 hours. No results for input(s): AMMONIA in the last 168 hours. Coagulation Profile: No results for input(s): INR, PROTIME in the last 168 hours. Cardiac Enzymes: No results for input(s): CKTOTAL, CKMB, CKMBINDEX, TROPONINI in the last 168 hours. BNP (last 3 results) No results for input(s): PROBNP in the last 8760 hours. HbA1C: No results for  input(s): HGBA1C in the last 72 hours. CBG: Recent Labs  Lab 10/03/19 1132 10/03/19 1651 10/03/19 2157 10/04/19 0735 10/04/19 1142  GLUCAP 150* 319* 153* 144* 217*   Lipid Profile: No results for input(s): CHOL, HDL, LDLCALC, TRIG, CHOLHDL, LDLDIRECT in the last 72 hours. Thyroid Function Tests: No results for input(s): TSH, T4TOTAL, FREET4, T3FREE, THYROIDAB in the last 72 hours. Anemia Panel: Recent Labs    10/03/19 0430 10/04/19 0350  FERRITIN 402* 460*   Urine analysis: No results found for: COLORURINE, APPEARANCEUR, LABSPEC, PHURINE, GLUCOSEU, HGBUR, BILIRUBINUR, KETONESUR, PROTEINUR, UROBILINOGEN, NITRITE, LEUKOCYTESUR Sepsis Labs: @LABRCNTIP (procalcitonin:4,lacticidven:4)  ) Recent Results (from the past 240 hour(s))  MRSA PCR Screening     Status: None   Collection Time: 09/25/19  4:33 AM   Specimen: Nasal Mucosa; Nasopharyngeal  Result Value Ref Range Status   MRSA by PCR NEGATIVE NEGATIVE Final    Comment:        The GeneXpert MRSA Assay (FDA approved for NASAL specimens only), is one component of a comprehensive MRSA colonization surveillance program. It is not intended to diagnose MRSA infection nor to guide or monitor treatment for MRSA infections. Performed at [facility], [place] [address] [place], [place] [address]          Radiology Studies: No results found.      Scheduled Meds: . apixaban  5 mg Oral BID  . atorvastatin  20 mg Oral q1800  . Chlorhexidine Gluconate Cloth  6 each Topical Daily  . famotidine  20 mg Oral BID  . feeding supplement (ENSURE ENLIVE)  237 mL Oral TID BM  . insulin aspart  0-9 Units Subcutaneous TID WC  . insulin aspart  2 Units Subcutaneous TID WC  . insulin detemir  6 Units Subcutaneous Daily  . mouth rinse  15 mL Mouth Rinse BID  . metoprolol tartrate  12.5 mg Oral BID  . pneumococcal 23 valent vaccine  0.5 mL Intramuscular Tomorrow-1000  . ramipril  2.5 mg Oral Daily  . sodium  chloride flush  3 mL Intravenous Q12H  . sodium chloride flush  3 mL Intravenous Q12H   Continuous Infusions: . sodium chloride Stopped (09/28/19 1933)     LOS: 9 days   The patient is critically ill with multiple organ systems failure and requires high complexity decision making for assessment and support, frequent evaluation and titration of therapies, application of advanced monitoring technologies and extensive interpretation of multiple databases. Critical Care Time devoted to patient care services described in this note  Time spent: 40 minutes     Corabelle Spackman, Geraldo Docker, MD [facility]  Pager [phone]  If 7PM-7AM, please contact night-coverage [email or web address] Password [email or web address] 10/04/2019, 2:50 PM

## 2019-10-05 ENCOUNTER — Inpatient Hospital Stay (HOSPITAL_COMMUNITY): Payer: No Typology Code available for payment source

## 2019-10-05 DIAGNOSIS — E118 Type 2 diabetes mellitus with unspecified complications: Secondary | ICD-10-CM

## 2019-10-05 DIAGNOSIS — E1165 Type 2 diabetes mellitus with hyperglycemia: Secondary | ICD-10-CM

## 2019-10-05 DIAGNOSIS — I480 Paroxysmal atrial fibrillation: Secondary | ICD-10-CM

## 2019-10-05 LAB — GLUCOSE, CAPILLARY
Glucose-Capillary: 314 mg/dL — ABNORMAL HIGH (ref 70–99)
Glucose-Capillary: 113 mg/dL — ABNORMAL HIGH (ref 70–99)
Glucose-Capillary: 177 mg/dL — ABNORMAL HIGH (ref 70–99)
Glucose-Capillary: 207 mg/dL — ABNORMAL HIGH (ref 70–99)
Glucose-Capillary: 243 mg/dL — ABNORMAL HIGH (ref 70–99)

## 2019-10-05 LAB — HEPATITIS PANEL, ACUTE
HCV Ab: NONREACTIVE
Hep A IgM: NONREACTIVE
Hep B C IgM: NONREACTIVE
Hepatitis B Surface Ag: NONREACTIVE

## 2019-10-05 LAB — CK: Total CK: 20 U/L — ABNORMAL LOW (ref 49–397)

## 2019-10-05 MED ORDER — METOPROLOL TARTRATE 25 MG PO TABS
25.0000 mg | ORAL_TABLET | Freq: Two times a day (BID) | ORAL | Status: DC
  Filled 2019-10-05: qty 1

## 2019-10-05 NOTE — Progress Notes (Signed)
[facility] PROGRESS NOTE    Progress Note  Robert Cuevas  [id number] DOB: [birth year] DOA: 09/25/2019 PCP: [facility]     Brief Narrative:   Robert Cuevas is an 85 y.o. male past medical history of complete heart block status post pacemaker, essential hypertension, paroxysmal atrial fibrillation on Eliquis, chronic respiratory failure oxygen dependent, non-insulin-dependent diabetes mellitus tested positive for COVID-19 on 09/21/2019 now presenting with progressive shortness of breath to the emergency room on 09/25/2019 as he was developing shortness of breath and a nonproductive cough at [facility] ED was found to be profoundly hypoxic requiring BiPAP transferred to [facility].  Assessment/Plan:   Acute on chronic respiratory failure with hypoxia due to COVID-19 pneumonia: Tested positive for SARS-CoV-2 on 09/21/2019. Started on IV Decadron and steroids has completed his course of IV remdesivir and Decadron. He is currently requiring 2 L of  nasal cannula to keep saturations greater than 93%. Has not completed a 5-day course of IV empiric antibiotics. His procalcitonin on 09/30/2019 was low yield. His inflammatory markers are significantly improved. Chest X-ray on 09/29/2019 showed improved airspace disease compared to previous x-rays.  Metabolic lactic acidosis: Procalcitonin does not show any signs of infection.  Has remained afebrile leukocytosis is resolved likely due to steroids.  Paroxysmal atrial fibrillation: Currently paced with a chads vas score of at least 4. Continue Eliquis. Start titrating up his metoprolol to home dose  Essential hypertension: Early metoprolol blood pressure seems to be well controlled. We will start to titrate up his metoprolol.  Uncontrolled diabetes mellitus type 2 with hyperglycemia without complications: With an A1c of 8.4. Currently on long-acting insulin plus sliding scale blood glucose seems to be fairly controlled. We will  DC his steroids today.  Elevated LFTs: Bilirubin AST and ALT are elevated and trending up, his alkaline phosphatase is stabilized. He did received Actemra, check hepatitis panel and CK levels. Check an abdominal ultrasound to rule out gallbladder disease  Present on admission pressure injury of skin   RN Pressure Injury Documentation: Pressure Injury 09/25/19 Sacrum Mid Stage II -  Partial thickness loss of dermis presenting as a shallow open ulcer with a red, pink wound bed without slough. (Active)  09/25/19 0402  Location: Sacrum  Location Orientation: Mid  Staging: Stage II -  Partial thickness loss of dermis presenting as a shallow open ulcer with a red, pink wound bed without slough.  Wound Description (Comments):   Present on Admission: Yes    Estimated body mass index is 20.38 kg/m as calculated from the following:   Height as of this encounter: 5\' 8"  (1.727 m).   Weight as of this encounter: 60.8 kg.   DVT prophylaxis: lovenox Family Communication:none Disposition Plan/Barrier to D/C: unable to determine Code Status:     Code Status Orders  (From admission, onward)         Start     Ordered   09/25/19 0426  Do not attempt resuscitation (DNR)  Continuous    Question Answer Comment  In the event of cardiac or respiratory ARREST Do not call a "code blue"   In the event of cardiac or respiratory ARREST Do not perform Intubation, CPR, defibrillation or ACLS   In the event of cardiac or respiratory ARREST Use medication by any route, position, wound care, and other measures to relive pain and suffering. May use oxygen, suction and manual treatment of airway obstruction as needed for comfort.      09/25/19 0425  Code Status History    Date Active Date Inactive Code Status Order ID Comments User Context   09/25/2019 0353 09/25/2019 0425 Full Code [id number]  Vianne Bulls, MD Inpatient   Advance Care Planning Activity        IV Access:    Peripheral  IV   Procedures and diagnostic studies:   No results found.   Medical Consultants:    None.  Anti-Infectives:   None  Subjective:    Robert Cuevas he has no new complaints today.  Objective:    Vitals:   10/04/19 1941 10/04/19 2221 10/04/19 2355 10/05/19 0433  BP: (!) 124/57 133/67 116/67 119/68  Pulse: 70 70 69 72  Resp:   (!) 21 20  Temp: 97.8 F (36.6 C)  98.1 F (36.7 C) 98.2 F (36.8 C)  TempSrc: Oral  Oral Oral  SpO2:   100% 93%  Weight:      Height:       SpO2: 93 % O2 Flow Rate (L/min): 4 L/min FiO2 (%): 45 %   Intake/Output Summary (Last 24 hours) at 10/05/2019 0824 Last data filed at 10/05/2019 0650 Gross per 24 hour  Intake 50 ml  Output 1100 ml  Net -1050 ml   Filed Weights   09/25/19 0500  Weight: 60.8 kg    Exam: General exam: In no acute distress. Respiratory system: Good air movement and clear to auscultation. Cardiovascular system: S1 & S2 heard, RRR. No JVD. Gastrointestinal system: Abdomen is nondistended, soft and nontender.  Central nervous system: Alert and oriented. No focal neurological deficits. Extremities: No pedal edema. Skin: No rashes, lesions or ulcers Psychiatry: Judgement and insight appear normal. Mood & affect appropriate.   Data Reviewed:    Labs: Basic Metabolic Panel: Recent Labs  Lab 09/30/19 0524 10/01/19 0303 10/02/19 0033 10/03/19 0430 10/04/19 0350  NA 138 137 137 138 137  K 3.9 4.8 4.6 4.7 4.9  CL 94* 94* 98 98 100  CO2 28 30 28 28 27   GLUCOSE 102* 145* 89 96 137*  BUN 32* 35* 30* 33* 34*  CREATININE 0.62 0.58* 0.56* 0.63 0.67  CALCIUM 9.1 8.9 8.9 9.0 8.7*  MG 1.8 1.9 1.8 1.9 1.9  PHOS 3.4 3.5 2.9 3.3 3.4   GFR Estimated Creatinine Clearance: 55.9 mL/min (by C-G formula based on SCr of 0.67 mg/dL). Liver Function Tests: Recent Labs  Lab 09/30/19 0524 10/01/19 0303 10/02/19 0033 10/03/19 0430 10/04/19 0350  AST 61* 61* 61* 68* 75*  ALT 36 42 45* 60* 72*  ALKPHOS 145* 140*  145* 156* 143*  BILITOT 3.1* 3.4* 2.9* 3.4* 3.5*  PROT 6.3* 6.1* 6.0* 6.0* 5.9*  ALBUMIN 3.2* 3.1* 3.0* 2.9* 3.2*   No results for input(s): LIPASE, AMYLASE in the last 168 hours. No results for input(s): AMMONIA in the last 168 hours. Coagulation profile No results for input(s): INR, PROTIME in the last 168 hours. COVID-19 Labs  Recent Labs    10/03/19 0430 10/04/19 0350  DDIMER 2.12* 1.53*  FERRITIN 402* 460*  CRP 0.5 0.6    No results found for: SARSCOV2NAA  CBC: Recent Labs  Lab 09/30/19 0524 10/01/19 0303 10/02/19 0033 10/03/19 0430 10/04/19 0350  WBC 11.5* 12.6* 13.4* 11.9* 10.7*  NEUTROABS 10.6* 11.6* 12.5* 10.8* 9.7*  HGB 17.8* 17.4* 17.1* 17.7* 17.2*  HCT 53.6* 53.2* 51.1 54.6* 52.0  MCV 95.5 95.3 96.1 96.0 95.4  PLT 152 137* 122* 118* 103*   Cardiac Enzymes: No results for input(s): CKTOTAL, CKMB,  CKMBINDEX, TROPONINI in the last 168 hours. BNP (last 3 results) No results for input(s): PROBNP in the last 8760 hours. CBG: Recent Labs  Lab 10/04/19 1142 10/04/19 1609 10/04/19 1939 10/04/19 2219 10/05/19 0723  GLUCAP 217* 331* 314* 127* 113*   D-Dimer: Recent Labs    10/03/19 0430 10/04/19 0350  DDIMER 2.12* 1.53*   Hgb A1c: No results for input(s): HGBA1C in the last 72 hours. Lipid Profile: No results for input(s): CHOL, HDL, LDLCALC, TRIG, CHOLHDL, LDLDIRECT in the last 72 hours. Thyroid function studies: No results for input(s): TSH, T4TOTAL, T3FREE, THYROIDAB in the last 72 hours.  Invalid input(s): FREET3 Anemia work up: Recent Labs    10/03/19 0430 10/04/19 0350  FERRITIN 402* 460*   Sepsis Labs: Recent Labs  Lab 09/29/19 1550 09/30/19 0312 09/30/19 0945 10/01/19 0303 10/02/19 0033 10/03/19 0430 10/04/19 0350  PROCALCITON <0.10 <0.10  --  <0.10  --   --   --   WBC  --   --   --  12.6* 13.4* 11.9* 10.7*  LATICACIDVEN  --   --  2.8*  --   --   --   --    Microbiology No results found for this or any previous visit  (from the past 240 hour(s)).   Medications:   . apixaban  5 mg Oral BID  . atorvastatin  20 mg Oral q1800  . Chlorhexidine Gluconate Cloth  6 each Topical Daily  . famotidine  20 mg Oral BID  . feeding supplement (ENSURE ENLIVE)  237 mL Oral TID BM  . insulin aspart  0-9 Units Subcutaneous TID WC  . insulin aspart  4 Units Subcutaneous TID WC  . insulin detemir  8 Units Subcutaneous Daily  . mouth rinse  15 mL Mouth Rinse BID  . metoprolol tartrate  12.5 mg Oral BID  . pneumococcal 23 valent vaccine  0.5 mL Intramuscular Tomorrow-1000  . ramipril  2.5 mg Oral Daily  . sodium chloride flush  3 mL Intravenous Q12H  . sodium chloride flush  3 mL Intravenous Q12H   Continuous Infusions: . sodium chloride Stopped (09/28/19 1933)      LOS: 10 days   Charlynne Cousins  [facility]  10/05/2019, 8:24 AM

## 2019-10-05 NOTE — Plan of Care (Signed)
  Problem: Education: Goal: Knowledge of risk factors and measures for prevention of condition will improve Outcome: Progressing   Problem: Respiratory: Goal: Will maintain a patent airway Outcome: Progressing   Problem: Elimination: Goal: Will not experience complications related to bowel motility Outcome: Progressing

## 2019-10-06 ENCOUNTER — Inpatient Hospital Stay (HOSPITAL_COMMUNITY): Payer: No Typology Code available for payment source

## 2019-10-06 LAB — GLUCOSE, CAPILLARY
Glucose-Capillary: 145 mg/dL — ABNORMAL HIGH (ref 70–99)
Glucose-Capillary: 146 mg/dL — ABNORMAL HIGH (ref 70–99)
Glucose-Capillary: 231 mg/dL — ABNORMAL HIGH (ref 70–99)

## 2019-10-06 MED ORDER — METOPROLOL TARTRATE 50 MG PO TABS
50.0000 mg | ORAL_TABLET | Freq: Two times a day (BID) | ORAL | Status: DC
  Administered 2019-10-06 – 2019-10-08 (×2): 50 mg via ORAL
  Filled 2019-10-06 (×3): qty 1

## 2019-10-06 MED ORDER — SODIUM CHLORIDE 0.9 % IV SOLN: INTRAVENOUS | Status: AC

## 2019-10-06 NOTE — Progress Notes (Signed)
[facility] PROGRESS NOTE    Progress Note  Robert Cuevas  [id number] DOB: Jun 20, 1930 DOA: 09/25/2019 PCP: [facility]     Brief Narrative:   Robert Cuevas is an 85 y.o. male past medical history of complete heart block status post pacemaker, essential hypertension, paroxysmal atrial fibrillation on Eliquis, chronic respiratory failure oxygen dependent, non-insulin-dependent diabetes mellitus tested positive for COVID-19 on 09/21/2019 now presenting with progressive shortness of breath to the emergency room on 09/25/2019 as he was developing shortness of breath and a nonproductive cough at [facility] ED was found to be profoundly hypoxic requiring BiPAP transferred to [facility].  Assessment/Plan:   Acute on chronic respiratory failure with hypoxia due to COVID-19 pneumonia: SARS-CoV-2 PCR positive on 09/21/2019. He has completed his course of IV Decadron and steroids 1 L of oxygen to keep saturations greater 95%.  He has also completed 5-day course of empiric antibiotics.  Metabolic lactic acidosis: Lactic acidosis resolved, likely due to Sirs.  Paroxysmal atrial fibrillation: Paced rhythm with a chads vas score at least 4, continue Eliquis. Continue to titrate up his metoprolol to home dose.  Essential hypertension: Blood pressure seems to be well controlled continue metoprolol.  Uncontrolled diabetes mellitus type 2 with hyperglycemia without complications: Blood glucose is fairly controlled continue current dose of long-acting insulin plus sliding scale.  Elevated LFTs: Bilirubin AST and ALT are elevated and trending up, his alkaline phosphatase is stabilized. CK was 20, abdominal ultrasound pending. Acute hepatitis panel was nonreactive.  Present on admission pressure injury of skin   RN Pressure Injury Documentation: Pressure Injury 09/25/19 Sacrum Mid Stage II -  Partial thickness loss of dermis presenting as a shallow open ulcer with a red, pink wound  bed without slough. (Active)  09/25/19 0402  Location: Sacrum  Location Orientation: Mid  Staging: Stage II -  Partial thickness loss of dermis presenting as a shallow open ulcer with a red, pink wound bed without slough.  Wound Description (Comments):   Present on Admission: Yes    Estimated body mass index is 20.38 kg/m as calculated from the following:   Height as of this encounter: 5\' 8"  (1.727 m).   Weight as of this encounter: 60.8 kg.   DVT prophylaxis: lovenox Family Communication:none Disposition Plan/Barrier to D/C: Skilled nursing facility possibly in the morning. Code Status:     Code Status Orders  (From admission, onward)         Start     Ordered   09/25/19 0426  Do not attempt resuscitation (DNR)  Continuous    Question Answer Comment  In the event of cardiac or respiratory ARREST Do not call a "code blue"   In the event of cardiac or respiratory ARREST Do not perform Intubation, CPR, defibrillation or ACLS   In the event of cardiac or respiratory ARREST Use medication by any route, position, wound care, and other measures to relive pain and suffering. May use oxygen, suction and manual treatment of airway obstruction as needed for comfort.      09/25/19 0425        Code Status History    Date Active Date Inactive Code Status Order ID Comments User Context   09/25/2019 0353 09/25/2019 0425 Full Code [id number]  Vianne Bulls, MD Inpatient   Advance Care Planning Activity        IV Access:    Peripheral IV   Procedures and diagnostic studies:   No results found.   Medical Consultants:  None.  Anti-Infectives:   None  Subjective:    [name] he has no new complaints today.  Objective:    Vitals:   10/05/19 1653 10/05/19 1854 10/05/19 1910 10/06/19 0440  BP: (!) 101/51 (!) 89/42 (!) 92/46 (!) 97/55  Pulse: 70 70 73 73  Resp: 18 18 19    Temp: 98 F (36.7 C) 98 F (36.7 C) 98 F (36.7 C) 98.7 F (37.1 C)  TempSrc:  Oral Oral Oral Oral  SpO2: 94% 94% 94% 99%  Weight:      Height:       SpO2: 99 % O2 Flow Rate (L/min): 1 L/min FiO2 (%): 45 %   Intake/Output Summary (Last 24 hours) at 10/06/2019 0726 Last data filed at 10/05/2019 1658 Gross per 24 hour  Intake 120 ml  Output 400 ml  Net -280 ml   Filed Weights   09/25/19 0500  Weight: 60.8 kg    Exam: General exam: In no acute distress. Respiratory system: Good air movement and clear to auscultation. Cardiovascular system: S1 & S2 heard, RRR. No JVD. Gastrointestinal system: Abdomen is nondistended, soft and nontender.  Central nervous system: Alert and oriented. No focal neurological deficits. Extremities: No pedal edema. Skin: No rashes, lesions or ulcers Psychiatry: Judgement and insight appear normal. Mood & affect appropriate.    Data Reviewed:    Labs: Basic Metabolic Panel: Recent Labs  Lab 09/30/19 0524 10/01/19 0303 10/02/19 0033 10/03/19 0430 10/04/19 0350  NA 138 137 137 138 137  K 3.9 4.8 4.6 4.7 4.9  CL 94* 94* 98 98 100  CO2 28 30 28 28 27   GLUCOSE 102* 145* 89 96 137*  BUN 32* 35* 30* 33* 34*  CREATININE 0.62 0.58* 0.56* 0.63 0.67  CALCIUM 9.1 8.9 8.9 9.0 8.7*  MG 1.8 1.9 1.8 1.9 1.9  PHOS 3.4 3.5 2.9 3.3 3.4   GFR Estimated Creatinine Clearance: 55.9 mL/min (by C-G formula based on SCr of 0.67 mg/dL). Liver Function Tests: Recent Labs  Lab 09/30/19 0524 10/01/19 0303 10/02/19 0033 10/03/19 0430 10/04/19 0350  AST 61* 61* 61* 68* 75*  ALT 36 42 45* 60* 72*  ALKPHOS 145* 140* 145* 156* 143*  BILITOT 3.1* 3.4* 2.9* 3.4* 3.5*  PROT 6.3* 6.1* 6.0* 6.0* 5.9*  ALBUMIN 3.2* 3.1* 3.0* 2.9* 3.2*   No results for input(s): LIPASE, AMYLASE in the last 168 hours. No results for input(s): AMMONIA in the last 168 hours. Coagulation profile No results for input(s): INR, PROTIME in the last 168 hours. COVID-19 Labs  Recent Labs    10/04/19 0350  DDIMER 1.53*  FERRITIN 460*  CRP 0.6    No results  found for: SARSCOV2NAA  CBC: Recent Labs  Lab 09/30/19 0524 10/01/19 0303 10/02/19 0033 10/03/19 0430 10/04/19 0350  WBC 11.5* 12.6* 13.4* 11.9* 10.7*  NEUTROABS 10.6* 11.6* 12.5* 10.8* 9.7*  HGB 17.8* 17.4* 17.1* 17.7* 17.2*  HCT 53.6* 53.2* 51.1 54.6* 52.0  MCV 95.5 95.3 96.1 96.0 95.4  PLT 152 137* 122* 118* 103*   Cardiac Enzymes: Recent Labs  Lab 10/05/19 1350  CKTOTAL 20*   BNP (last 3 results) No results for input(s): PROBNP in the last 8760 hours. CBG: Recent Labs  Lab 10/05/19 0723 10/05/19 1141 10/05/19 1700 10/05/19 1920 10/06/19 0713  GLUCAP 113* 177* 207* 243* 145*   D-Dimer: Recent Labs    10/04/19 0350  DDIMER 1.53*   Hgb A1c: No results for input(s): HGBA1C in the last 72 hours. Lipid  Profile: No results for input(s): CHOL, HDL, LDLCALC, TRIG, CHOLHDL, LDLDIRECT in the last 72 hours. Thyroid function studies: No results for input(s): TSH, T4TOTAL, T3FREE, THYROIDAB in the last 72 hours.  Invalid input(s): FREET3 Anemia work up: Recent Labs    10/04/19 0350  FERRITIN 460*   Sepsis Labs: Recent Labs  Lab 09/29/19 1550 09/30/19 0312 09/30/19 0945 10/01/19 0303 10/02/19 0033 10/03/19 0430 10/04/19 0350  PROCALCITON <0.10 <0.10  --  <0.10  --   --   --   WBC  --   --   --  12.6* 13.4* 11.9* 10.7*  LATICACIDVEN  --   --  2.8*  --   --   --   --    Microbiology No results found for this or any previous visit (from the past 240 hour(s)).   Medications:   . apixaban  5 mg Oral BID  . atorvastatin  20 mg Oral q1800  . Chlorhexidine Gluconate Cloth  6 each Topical Daily  . famotidine  20 mg Oral BID  . feeding supplement (ENSURE ENLIVE)  237 mL Oral TID BM  . insulin aspart  0-9 Units Subcutaneous TID WC  . insulin aspart  4 Units Subcutaneous TID WC  . insulin detemir  8 Units Subcutaneous Daily  . mouth rinse  15 mL Mouth Rinse BID  . metoprolol tartrate  25 mg Oral BID  . pneumococcal 23 valent vaccine  0.5 mL Intramuscular  Tomorrow-1000  . ramipril  2.5 mg Oral Daily  . sodium chloride flush  3 mL Intravenous Q12H  . sodium chloride flush  3 mL Intravenous Q12H   Continuous Infusions: . sodium chloride Stopped (09/28/19 1933)      LOS: 11 days   Charlynne Cousins  [facility]  10/06/2019, 7:26 AM

## 2019-10-06 NOTE — Progress Notes (Signed)
Remote pacemaker transmission.   

## 2019-10-06 NOTE — Progress Notes (Signed)
Physical Therapy Treatment Patient Details Name: ROBYN RAMETTA MRN: [id number] DOB: 05/17/1930 Today's Date: 10/06/2019    History of Present Illness Pt is an 85 y.o. male admitted 09/25/19 with worsening SOB; initially tested (+) COVID-19 on 09/21/19. PMH includes afib, DM2, HTN.    PT Comments    Patient's voice stronger and overall appearance better today. BP remains low therefore began session with supine exercises with MAP increasing 62 to 66. Progressed to sitting EOB with BP 90/35 (MAP 52). Patient reporting dizziness and decr arousal noted. Returned to supine with HOB remained elevated at 45 degrees. BP returned to 112/41 and dizziness decreased. Progressed to semi-chair position in bed with HOB 40 and feet lowered with BP 106/44 (MAP 62). RN and MD made aware. Hypotension continues to limit his progress.    Follow Up Recommendations  SNF;Supervision for mobility/OOB     Equipment Recommendations  Other (comment)(TBD)    Recommendations for Other Services       Precautions / Restrictions Precautions Precautions: Fall Restrictions Weight Bearing Restrictions: No    Mobility  Bed Mobility Overal bed mobility: Needs Assistance Bed Mobility: Supine to Sit     Supine to sit: Mod assist;HOB elevated     General bed mobility comments: required assist mostly due to neck/shoulder pain  Transfers                    Ambulation/Gait                 Stairs             Wheelchair Mobility    Modified Rankin (Stroke Patients Only)       Balance Overall balance assessment: Needs assistance Sitting-balance support: Feet unsupported;Single extremity supported Sitting balance-Leahy Scale: Poor Sitting balance - Comments: close minguard for sitting balance once stabilized                                     Cognition Arousal/Alertness: Awake/alert Behavior During Therapy: WFL for tasks assessed/performed Overall Cognitive  Status: No family/caregiver present to determine baseline cognitive functioning                                 General Comments: much better intelligibility, although occasionally hard to understand (negative pressure fan and CAPR fan blowing)      Exercises General Exercises - Lower Extremity Ankle Circles/Pumps: AROM;Both;10 reps;Supine Heel Slides: AROM;Strengthening;Both;5 reps;Supine(resisted extension) Other Exercises Other Exercises: supine: hand pumps x 10, resisted elbow extension x 5, shoulder flexion x3 (limited due to left shoulder/neck pain)    General Comments General comments (skin integrity, edema, etc.): 102/48 (66) supine HR 73 ; leg ex's 105/43 (62) HR 73; arm exercises 100/65 (75); sit EOB 90/35 (51) HR 72 paced;supine with HOB45  112/41 (61) HR 70; chair position 106/44 (62) 70      Pertinent Vitals/Pain Pain Assessment: Faces Faces Pain Scale: Hurts whole lot Pain Location: left neck/shoulder  Pain Descriptors / Indicators: Grimacing(grabbing) Pain Intervention(s): Limited activity within patient's tolerance;Repositioned    Home Living                      Prior Function            PT Goals (current goals can now be found in the care plan section) Acute Rehab PT  Goals Patient Stated Goal: Get home PT Goal Formulation: With patient Time For Goal Achievement: 10/12/19 Potential to Achieve Goals: Fair Progress towards PT goals: Not progressing toward goals - comment(hypotension)    Frequency    Min 2X/week      PT Plan Current plan remains appropriate    Co-evaluation              AM-PAC PT "6 Clicks" Mobility   Outcome Measure  Help needed turning from your back to your side while in a flat bed without using bedrails?: A Lot Help needed moving from lying on your back to sitting on the side of a flat bed without using bedrails?: A Lot Help needed moving to and from a bed to a chair (including a wheelchair)?:  Total Help needed standing up from a chair using your arms (e.g., wheelchair or bedside chair)?: Total Help needed to walk in hospital room?: Total Help needed climbing 3-5 steps with a railing? : Total 6 Click Score: 8    End of Session Equipment Utilized During Treatment: Oxygen Activity Tolerance: Treatment limited secondary to medical complications (Comment)(hypotension) Patient left: with call bell/phone within reach;in bed;with bed alarm set Nurse Communication: Other (comment)(hypotension with EOB) PT Visit Diagnosis: Other abnormalities of gait and mobility (R26.89);Muscle weakness (generalized) (M62.81)     Time: [id number] PT Time Calculation (min) (ACUTE ONLY): 34 min  Charges:  $Therapeutic Exercise: 8-22 mins $Therapeutic Activity: 8-22 mins                      Arby Barrette, PT Pager [phone]    Rexanne Mano 10/06/2019, 2:15 PM

## 2019-10-06 NOTE — Plan of Care (Addendum)
Patient in bed resting. No s/s of pain or distress. All medication given well tolerated. Spoke to wife with daily update. Will continue to monitor for remainder of shift.   Problem: Education: Goal: Knowledge of risk factors and measures for prevention of condition will improve 10/06/2019 1126 by Orvan Falconer, RN Outcome: Progressing 10/06/2019 1126 by Orvan Falconer, RN Outcome: Progressing   Problem: Coping: Goal: Psychosocial and spiritual needs will be supported 10/06/2019 1126 by Orvan Falconer, RN Outcome: Progressing 10/06/2019 1126 by Orvan Falconer, RN Outcome: Progressing   Problem: Respiratory: Goal: Will maintain a patent airway 10/06/2019 1126 by Orvan Falconer, RN Outcome: Progressing 10/06/2019 1126 by Orvan Falconer, RN Outcome: Progressing Goal: Complications related to the disease process, condition or treatment will be avoided or minimized 10/06/2019 1126 by Orvan Falconer, RN Outcome: Progressing 10/06/2019 1126 by Orvan Falconer, RN Outcome: Progressing   Problem: Education: Goal: Knowledge of General Education information will improve Description: Including pain rating scale, medication(s)/side effects and non-pharmacologic comfort measures 10/06/2019 1126 by Orvan Falconer, RN Outcome: Progressing 10/06/2019 1126 by Orvan Falconer, RN Outcome: Progressing   Problem: Health Behavior/Discharge Planning: Goal: Ability to manage health-related needs will improve 10/06/2019 1126 by Orvan Falconer, RN Outcome: Progressing 10/06/2019 1126 by Orvan Falconer, RN Outcome: Progressing   Problem: Clinical Measurements: Goal: Ability to maintain clinical measurements within normal limits will improve 10/06/2019 1126 by Orvan Falconer, RN Outcome: Progressing 10/06/2019 1126 by Orvan Falconer, RN Outcome: Progressing Goal: Will remain free from infection 10/06/2019 1126 by Orvan Falconer,  RN Outcome: Progressing 10/06/2019 1126 by Orvan Falconer, RN Outcome: Progressing Goal: Diagnostic test results will improve 10/06/2019 1126 by Orvan Falconer, RN Outcome: Progressing 10/06/2019 1126 by Orvan Falconer, RN Outcome: Progressing Goal: Respiratory complications will improve 10/06/2019 1126 by Orvan Falconer, RN Outcome: Progressing 10/06/2019 1126 by Orvan Falconer, RN Outcome: Progressing Goal: Cardiovascular complication will be avoided 10/06/2019 1126 by Orvan Falconer, RN Outcome: Progressing 10/06/2019 1126 by Orvan Falconer, RN Outcome: Progressing   Problem: Activity: Goal: Risk for activity intolerance will decrease 10/06/2019 1126 by Orvan Falconer, RN Outcome: Progressing 10/06/2019 1126 by Orvan Falconer, RN Outcome: Progressing   Problem: Nutrition: Goal: Adequate nutrition will be maintained 10/06/2019 1126 by Orvan Falconer, RN Outcome: Progressing 10/06/2019 1126 by Orvan Falconer, RN Outcome: Progressing   Problem: Coping: Goal: Level of anxiety will decrease 10/06/2019 1126 by Orvan Falconer, RN Outcome: Progressing 10/06/2019 1126 by Orvan Falconer, RN Outcome: Progressing   Problem: Elimination: Goal: Will not experience complications related to bowel motility 10/06/2019 1126 by Orvan Falconer, RN Outcome: Progressing 10/06/2019 1126 by Orvan Falconer, RN Outcome: Progressing Goal: Will not experience complications related to urinary retention 10/06/2019 1126 by Orvan Falconer, RN Outcome: Progressing 10/06/2019 1126 by Orvan Falconer, RN Outcome: Progressing   Problem: Pain Managment: Goal: General experience of comfort will improve 10/06/2019 1126 by Orvan Falconer, RN Outcome: Progressing 10/06/2019 1126 by Orvan Falconer, RN Outcome: Progressing   Problem: Safety: Goal: Ability to remain free from injury will improve 10/06/2019 1126 by Orvan Falconer, RN Outcome: Progressing 10/06/2019 1126 by Orvan Falconer, RN Outcome: Progressing   Problem: Skin Integrity: Goal: Risk for impaired skin integrity will decrease 10/06/2019 1126 by Orvan Falconer, RN Outcome: Progressing 10/06/2019 1126 by Orvan Falconer, RN Outcome: Progressing

## 2019-10-07 DIAGNOSIS — J1289 Other viral pneumonia: Secondary | ICD-10-CM

## 2019-10-07 LAB — HEPATIC FUNCTION PANEL
ALT: 117 U/L — ABNORMAL HIGH (ref 0–44)
AST: 96 U/L — ABNORMAL HIGH (ref 15–41)
Albumin: 2.9 g/dL — ABNORMAL LOW (ref 3.5–5.0)
Alkaline Phosphatase: 138 U/L — ABNORMAL HIGH (ref 38–126)
Bilirubin, Direct: 0.3 mg/dL — ABNORMAL HIGH (ref 0.0–0.2)
Indirect Bilirubin: 1.8 mg/dL — ABNORMAL HIGH (ref 0.3–0.9)
Total Bilirubin: 2.1 mg/dL — ABNORMAL HIGH (ref 0.3–1.2)
Total Protein: 5.5 g/dL — ABNORMAL LOW (ref 6.5–8.1)

## 2019-10-07 LAB — BASIC METABOLIC PANEL
Anion gap: 9 (ref 5–15)
BUN: 35 mg/dL — ABNORMAL HIGH (ref 8–23)
CO2: 27 mmol/L (ref 22–32)
Calcium: 8.2 mg/dL — ABNORMAL LOW (ref 8.9–10.3)
Chloride: 99 mmol/L (ref 98–111)
Creatinine, Ser: 0.58 mg/dL — ABNORMAL LOW (ref 0.61–1.24)
GFR calc Af Amer: >60 mL/min (ref >60)
GFR calc non Af Amer: >60 mL/min (ref >60)
Glucose, Bld: 130 mg/dL — ABNORMAL HIGH (ref 70–99)
Potassium: 4.7 mmol/L (ref 3.5–5.1)
Sodium: 135 mmol/L (ref 135–145)

## 2019-10-07 LAB — GLUCOSE, CAPILLARY
Glucose-Capillary: 192 mg/dL — ABNORMAL HIGH (ref 70–99)
Glucose-Capillary: 113 mg/dL — ABNORMAL HIGH (ref 70–99)
Glucose-Capillary: 194 mg/dL — ABNORMAL HIGH (ref 70–99)
Glucose-Capillary: 139 mg/dL — ABNORMAL HIGH (ref 70–99)
Glucose-Capillary: 229 mg/dL — ABNORMAL HIGH (ref 70–99)

## 2019-10-07 NOTE — TOC Progression Note (Addendum)
Transition of Care [facility]) - Progression Note    Patient Details  Name: Robert Cuevas MRN: [id number] Date of Birth: May 29, 1930  [facility] of [facility] CM/SW Contact  Loletha Grayer Beverely Pace, RN Phone Number: 10/07/2019, 10:14 AM  Clinical Narrative:   Case manager has spoken with patient's wife this morning concerning Shortterm rehab facility for her husband. Mrs. Rybacki states she really wants him home but is agreeable for him to go to [facility] for a week. She also states she wants to be sure he has [place] for when he leaves [place]. Case manager contacted Adela Lank with [facility] as requested and asked that he follow patient at [facility].Mrs. Donabedian asked that Dr. Ezzie Dural notified of what her wishes are, CM will do so. [facility] available bed for patient.  Case Manager has initiated request for authorization from [place]. .    Expected Discharge Plan: Skilled Nursing Facility Barriers to Discharge: Continued Medical Work up  Expected Discharge Plan and Services Expected Discharge Plan: [place] Choice: [place] arrangements for the past 2 months: Single Family Home                           HH Arranged: PT, OT [facility] Agency: [place] Date [facility] Agency Contacted: 10/07/19 Time HH Agency Contacted: [phone]     Social Determinants of Health (SDOH) Interventions    Readmission Risk Interventions Readmission Risk Prevention Plan 10/02/2019  Medication Review (RN CM) Complete  Some recent data might be hidden

## 2019-10-07 NOTE — Plan of Care (Signed)
  Problem: Coping: Goal: Psychosocial and spiritual needs will be supported Outcome: Progressing   Problem: Respiratory: Goal: Will maintain a patent airway Outcome: Progressing Goal: Complications related to the disease process, condition or treatment will be avoided or minimized Outcome: Progressing   

## 2019-10-07 NOTE — Progress Notes (Signed)
[facility] PROGRESS NOTE    Progress Note  Robert Cuevas  [id number] DOB: 1930-06-24 DOA: 09/25/2019 PCP: [facility]     Brief Narrative:   Robert Cuevas is an 85 y.o. male past medical history of complete heart block status post pacemaker, essential hypertension, paroxysmal atrial fibrillation on Eliquis, chronic respiratory failure oxygen dependent, non-insulin-dependent diabetes mellitus tested positive for COVID-19 on 09/21/2019 now presenting with progressive shortness of breath to the emergency room on 09/25/2019 as he was developing shortness of breath and a nonproductive cough at [facility] ED was found to be profoundly hypoxic requiring BiPAP transferred to [facility].  Assessment/Plan:   Acute on chronic respiratory failure with hypoxia due to COVID-19 pneumonia: The patient is still requiring 1 L of oxygen to keep saturations greater than 94%, is complete his course of IV remdesivir and steroids.  He is also completed his course of IV empiric antibiotics. Ambulate and check saturations. Physical therapy has evaluated the patient recommended skilled nursing facility.  I did speak with Mrs. Riggan yesterday afternoon and she is undecided whether he is could go home or to a skilled nursing facility.  She relates she will see his skilled nursing facilities today and have an answer for me this afternoon.  Metabolic lactic acidosis: Lactic acidosis resolved, likely due to Sirs.  Paroxysmal atrial fibrillation: Paced rhythm with a chads vas score at least 4, continue Eliquis. Continue to titrate up his metoprolol to home dose.  Essential hypertension: Blood pressure seems to be well controlled continue metoprolol.  Uncontrolled diabetes mellitus type 2 with hyperglycemia without complications: Blood glucose is well controlled continue long-acting sample sliding scale.  Elevated LFTs: Bilirubin AST and ALT are elevated and trending up, his alkaline phosphatase is  stabilized. CK was 20, abdominal ultrasound showed fatty liver no acute findings. Acute hepatitis panel was nonreactive.  Present on admission pressure injury of skin   RN Pressure Injury Documentation: Pressure Injury 09/25/19 Sacrum Mid Stage II -  Partial thickness loss of dermis presenting as a shallow open ulcer with a red, pink wound bed without slough. (Active)  09/25/19 0402  Location: Sacrum  Location Orientation: Mid  Staging: Stage II -  Partial thickness loss of dermis presenting as a shallow open ulcer with a red, pink wound bed without slough.  Wound Description (Comments):   Present on Admission: Yes    Estimated body mass index is 20.38 kg/m as calculated from the following:   Height as of this encounter: 5\' 8"  (1.727 m).   Weight as of this encounter: 60.8 kg.   DVT prophylaxis: lovenox Family Communication:none Disposition Plan/Barrier to D/C: Skilled nursing facility 10/08/2019. Code Status:     Code Status Orders  (From admission, onward)         Start     Ordered   09/25/19 0426  Do not attempt resuscitation (DNR)  Continuous    Question Answer Comment  In the event of cardiac or respiratory ARREST Do not call a "code blue"   In the event of cardiac or respiratory ARREST Do not perform Intubation, CPR, defibrillation or ACLS   In the event of cardiac or respiratory ARREST Use medication by any route, position, wound care, and other measures to relive pain and suffering. May use oxygen, suction and manual treatment of airway obstruction as needed for comfort.      09/25/19 0425        Code Status History    Date Active Date Inactive Code Status Order  ID Comments User Context   09/25/2019 0353 09/25/2019 0425 Full Code [id number]  Vianne Bulls, MD Inpatient   Advance Care Planning Activity        IV Access:    Peripheral IV   Procedures and diagnostic studies:   US Abdomen Complete  Result Date: 10/06/2019 CLINICAL DATA:  Elevated  liver function tests.  Hyperbilirubinemia. EXAM: ABDOMEN ULTRASOUND COMPLETE COMPARISON:  CT chest 03/03/2013. FINDINGS: Gallbladder: No gallstones or wall thickening visualized. No sonographic Murphy sign noted by sonographer. Common bile duct: Diameter: 0.4 cm Liver: No focal lesion. The liver appears heterogeneous with increased echogenicity. Portal vein is patent on color Doppler imaging with normal direction of blood flow towards the liver. IVC: No abnormality visualized. Pancreas: Visualized portion unremarkable. Spleen: Size and appearance within normal limits. Right Kidney: Length: 9.0. Echogenicity within normal limits. No mass or hydronephrosis visualized. Left Kidney: Length: 9.3 cm. Echogenicity within normal limits. No mass or hydronephrosis visualized. Abdominal aorta: No aneurysm visualized. Other findings: Small right pleural effusion is seen. IMPRESSION: Fatty infiltration of the liver. Negative for biliary dilatation. Small right pleural effusion. Electronically Signed   By: Inge Rise M.D.   On: 10/06/2019 11:47     Medical Consultants:    None.  Anti-Infectives:   None  Subjective:    Robert Cuevas has no new complaints today.  Objective:    Vitals:   10/06/19 1940 10/06/19 2127 10/07/19 0015 10/07/19 0400  BP: (!) 105/46 (!) 111/48 (!) 91/42 (!) 99/52  Pulse: 70 72 69 70  Resp: 20  20 18   Temp: 97.8 F (36.6 C)  97.9 F (36.6 C) 97.8 F (36.6 C)  TempSrc: Oral  Oral Oral  SpO2: 99%  93% 95%  Weight:      Height:       SpO2: 95 % O2 Flow Rate (L/min): 1.5 L/min FiO2 (%): 45 %   Intake/Output Summary (Last 24 hours) at 10/07/2019 0717 Last data filed at 10/07/2019 0433 Gross per 24 hour  Intake 1163.94 ml  Output 500 ml  Net 663.94 ml   Filed Weights   09/25/19 0500  Weight: 60.8 kg    Exam: General exam: In no acute distress. Respiratory system: Good air movement and clear to auscultation. Cardiovascular system: S1 & S2 heard, RRR. No  JVD.  Gastrointestinal system: Abdomen is nondistended, soft and nontender.  Central nervous system: Alert and oriented. No focal neurological deficits. Extremities: No pedal edema. Skin: No rashes, lesions or ulcers Psychiatry: Judgement and insight appear normal. Mood & affect appropriate.    Data Reviewed:    Labs: Basic Metabolic Panel: Recent Labs  Lab 10/01/19 0303 10/02/19 0033 10/03/19 0430 10/04/19 0350 10/07/19 0344  NA 137 137 138 137 135  K 4.8 4.6 4.7 4.9 4.7  CL 94* 98 98 100 99  CO2 30 28 28 27 27   GLUCOSE 145* 89 96 137* 130*  BUN 35* 30* 33* 34* 35*  CREATININE 0.58* 0.56* 0.63 0.67 0.58*  CALCIUM 8.9 8.9 9.0 8.7* 8.2*  MG 1.9 1.8 1.9 1.9  --   PHOS 3.5 2.9 3.3 3.4  --    GFR Estimated Creatinine Clearance: 55.9 mL/min (A) (by C-G formula based on SCr of 0.58 mg/dL (L)). Liver Function Tests: Recent Labs  Lab 10/01/19 0303 10/02/19 0033 10/03/19 0430 10/04/19 0350  AST 61* 61* 68* 75*  ALT 42 45* 60* 72*  ALKPHOS 140* 145* 156* 143*  BILITOT 3.4* 2.9* 3.4* 3.5*  PROT 6.1* 6.0*  6.0* 5.9*  ALBUMIN 3.1* 3.0* 2.9* 3.2*   No results for input(s): LIPASE, AMYLASE in the last 168 hours. No results for input(s): AMMONIA in the last 168 hours. Coagulation profile No results for input(s): INR, PROTIME in the last 168 hours. COVID-19 Labs  No results for input(s): DDIMER, FERRITIN, LDH, CRP in the last 72 hours.  No results found for: SARSCOV2NAA  CBC: Recent Labs  Lab 10/01/19 0303 10/02/19 0033 10/03/19 0430 10/04/19 0350  WBC 12.6* 13.4* 11.9* 10.7*  NEUTROABS 11.6* 12.5* 10.8* 9.7*  HGB 17.4* 17.1* 17.7* 17.2*  HCT 53.2* 51.1 54.6* 52.0  MCV 95.3 96.1 96.0 95.4  PLT 137* 122* 118* 103*   Cardiac Enzymes: Recent Labs  Lab 10/05/19 1350  CKTOTAL 20*   BNP (last 3 results) No results for input(s): PROBNP in the last 8760 hours. CBG: Recent Labs  Lab 10/05/19 1920 10/06/19 0713 10/06/19 1137 10/06/19 1552 10/06/19 2320   GLUCAP 243* 145* 146* 231* 192*   D-Dimer: No results for input(s): DDIMER in the last 72 hours. Hgb A1c: No results for input(s): HGBA1C in the last 72 hours. Lipid Profile: No results for input(s): CHOL, HDL, LDLCALC, TRIG, CHOLHDL, LDLDIRECT in the last 72 hours. Thyroid function studies: No results for input(s): TSH, T4TOTAL, T3FREE, THYROIDAB in the last 72 hours.  Invalid input(s): FREET3 Anemia work up: No results for input(s): VITAMINB12, FOLATE, FERRITIN, TIBC, IRON, RETICCTPCT in the last 72 hours. Sepsis Labs: Recent Labs  Lab 09/30/19 0945 10/01/19 0303 10/02/19 0033 10/03/19 0430 10/04/19 0350  PROCALCITON  --  <0.10  --   --   --   WBC  --  12.6* 13.4* 11.9* 10.7*  LATICACIDVEN 2.8*  --   --   --   --    Microbiology No results found for this or any previous visit (from the past 240 hour(s)).   Medications:   . apixaban  5 mg Oral BID  . atorvastatin  20 mg Oral q1800  . Chlorhexidine Gluconate Cloth  6 each Topical Daily  . famotidine  20 mg Oral BID  . feeding supplement (ENSURE ENLIVE)  237 mL Oral TID BM  . insulin aspart  0-9 Units Subcutaneous TID WC  . insulin aspart  4 Units Subcutaneous TID WC  . insulin detemir  8 Units Subcutaneous Daily  . mouth rinse  15 mL Mouth Rinse BID  . metoprolol tartrate  50 mg Oral BID  . pneumococcal 23 valent vaccine  0.5 mL Intramuscular Tomorrow-1000  . ramipril  2.5 mg Oral Daily   Continuous Infusions:     LOS: 12 days   Charlynne Cousins  [facility]  10/07/2019, 7:17 AM

## 2019-10-07 NOTE — Progress Notes (Addendum)
Occupational Therapy Treatment Patient Details Name: Robert Cuevas MRN: [id number] DOB: 30-Jan-1930 Today's Date: 10/07/2019    History of present illness Pt is an 85 y.o. male admitted 09/25/19 with worsening SOB; initially tested (+) COVID-19 on 09/21/19. PMH includes afib, DM2, HTN.   OT comments  Pt progressing towards OT goals, presents supine in bed pleasant and willing to participate in therapy session. Pt able to progress OOB to [facility] and to recliner during this session, overall tolerating well (see vitals/BP below). Pt continues to present with significant weakness and fatigue with activity, initially requiring modA+2 (HHA) for functional transfers progressed to maxA+2 with increased fatigue. Pt requiring totalA for completion of toileting ADL, though happy to be OOB by end of session. Pt initially on 1.5L with O2 probe on 1st digit, SpO2 decreasing to 80% with activity, requiring seated rest and increased O2 (eventually up to 6L) to return O2 sats to 88%, overall pt with minimal SOB and denies dizziness throughout session, switched O2 probe to earlobe end of session with O2 sats reading into mid 90s on 2L at rest. Continue to recommend SNF level therapies at time of discharge. Will continue to follow acutely.  Pt Position BP  Supine 99/49 (61)  Post transfer to recliner 104/43 (65)  Post transfer to [facility] 114/43 (65)  Transfer back to recliner post standing pericare 112/47 (68)  End of session seated in recliner  105/43 (62)      Follow Up Recommendations  SNF;Supervision/Assistance - 24 hour    Equipment Recommendations  Other (comment);3 in 1 bedside commode(defer to next venue)          Precautions / Restrictions Precautions Precautions: Fall Precaution Comments: watch O2 sats Restrictions Weight Bearing Restrictions: No       Mobility Bed Mobility Overal bed mobility: Needs Assistance Bed Mobility: Supine to Sit     Supine to sit: Mod assist;HOB elevated      General bed mobility comments: assist for trunk elevation; cues to scoot towards EOB  Transfers Overall transfer level: Needs assistance Equipment used: 2 person hand held assist Transfers: Sit to/from [facility] Sit to Stand: Mod assist;+2 physical assistance;+2 safety/equipment Stand pivot transfers: Mod assist;Max assist;+2 physical assistance;+2 safety/equipment       General transfer comment: boosting and steadying assist throughout with cues for pt to push through LEs for full upright position, pt fatigues easily requiring up to maxA+2 for final transfers; transferred EOB>recliner>BSC>recliner during session     Balance Overall balance assessment: Needs assistance Sitting-balance support: Feet unsupported;Single extremity supported Sitting balance-Leahy Scale: Fair Sitting balance - Comments: close minguard for sitting balance once stabilized                                    ADL either performed or assessed with clinical judgement   ADL Overall ADL's : Needs assistance/impaired                         Toilet Transfer: Moderate assistance;Maximal assistance;+2 for physical assistance;+2 for safety/equipment;Stand-pivot;BSC Toilet Transfer Details (indicate cue type and reason): HHA Toileting- Clothing Manipulation and Hygiene: Total assistance;+2 for physical assistance;+2 for safety/equipment;Sit to/from stand Toileting - Clothing Manipulation Details (indicate cue type and reason): assist for standing balance with additional assist for pericare after BM     Functional mobility during ADLs: Moderate assistance;Maximal assistance;+2 for physical assistance;+2 for safety/equipment General ADL  Comments: pt with continued weakness and fatigue with activity, tolerated transfer to recliner and BSC during this session     Vision       Perception     Praxis      Cognition Arousal/Alertness: Awake/alert Behavior During Therapy:  WFL for tasks assessed/performed Overall Cognitive Status: No family/caregiver present to determine baseline cognitive functioning                                 General Comments: cues for safety required, pt also HOH requiring repetition throughout session        Exercises Other Exercises Other Exercises: cues for deep breathing exercises/techniques   Shoulder Instructions       General Comments      Pertinent Vitals/ Pain       Pain Assessment: No/denies pain  Home Living                                          Prior Functioning/Environment              Frequency  Min 2X/week        Progress Toward Goals  OT Goals(current goals can now be found in the care plan section)  Progress towards OT goals: Progressing toward goals  Acute Rehab OT Goals Patient Stated Goal: Get home, happy to be OOB today OT Goal Formulation: With patient Time For Goal Achievement: 10/14/19 Potential to Achieve Goals: Good ADL Goals Pt Will Perform Grooming: with supervision;sitting Pt Will Perform Lower Body Bathing: sitting/lateral leans;sit to/from stand;with min guard assist Pt Will Perform Lower Body Dressing: with min guard assist;sit to/from stand;sitting/lateral leans Pt Will Transfer to Toilet: with min guard assist;ambulating;stand pivot transfer Pt Will Perform Toileting - Clothing Manipulation and hygiene: with min guard assist;sit to/from stand;sitting/lateral leans Additional ADL Goal #1: Pt will tolerate seated/standing activity >5 min with VSS throughout.  Plan Discharge plan remains appropriate    Co-evaluation                 AM-PAC OT "6 Clicks" Daily Activity     Outcome Measure   Help from another person eating meals?: A Little Help from another person taking care of personal grooming?: A Little Help from another person toileting, which includes using toliet, bedpan, or urinal?: A Lot Help from another person bathing  (including washing, rinsing, drying)?: A Lot Help from another person to put on and taking off regular upper body clothing?: A Little Help from another person to put on and taking off regular lower body clothing?: A Lot 6 Click Score: 15    End of Session Equipment Utilized During Treatment: Oxygen;Gait belt  OT Visit Diagnosis: Muscle weakness (generalized) (M62.81);Unsteadiness on feet (R26.81)   Activity Tolerance Patient tolerated treatment well   Patient Left in chair;with call bell/phone within reach;with chair alarm set   Nurse Communication Mobility status        Time: 1126-1203 OT Time Calculation (min): 37 min  Charges: OT General Charges $OT Visit: 1 Visit OT Treatments $Self Care/Home Management : 23-37 mins  Lou Cal, OT Supplemental Rehabilitation Services Pager [phone] Office [phone]   Raymondo Band 10/07/2019, 2:31 PM

## 2019-10-08 DIAGNOSIS — I10 Essential (primary) hypertension: Secondary | ICD-10-CM | POA: Diagnosis not present

## 2019-10-08 DIAGNOSIS — Z950 Presence of cardiac pacemaker: Secondary | ICD-10-CM | POA: Diagnosis not present

## 2019-10-08 DIAGNOSIS — I442 Atrioventricular block, complete: Secondary | ICD-10-CM | POA: Diagnosis not present

## 2019-10-08 DIAGNOSIS — J1289 Other viral pneumonia: Secondary | ICD-10-CM | POA: Diagnosis not present

## 2019-10-08 DIAGNOSIS — R52 Pain, unspecified: Secondary | ICD-10-CM | POA: Diagnosis not present

## 2019-10-08 DIAGNOSIS — E1165 Type 2 diabetes mellitus with hyperglycemia: Secondary | ICD-10-CM | POA: Diagnosis not present

## 2019-10-08 DIAGNOSIS — I480 Paroxysmal atrial fibrillation: Secondary | ICD-10-CM | POA: Diagnosis not present

## 2019-10-08 DIAGNOSIS — Z9981 Dependence on supplemental oxygen: Secondary | ICD-10-CM | POA: Diagnosis not present

## 2019-10-08 DIAGNOSIS — R279 Unspecified lack of coordination: Secondary | ICD-10-CM | POA: Diagnosis not present

## 2019-10-08 DIAGNOSIS — U071 COVID-19: Secondary | ICD-10-CM | POA: Diagnosis not present

## 2019-10-08 DIAGNOSIS — R2689 Other abnormalities of gait and mobility: Secondary | ICD-10-CM | POA: Diagnosis not present

## 2019-10-08 DIAGNOSIS — A4181 Sepsis due to Enterococcus: Secondary | ICD-10-CM | POA: Diagnosis not present

## 2019-10-08 DIAGNOSIS — E118 Type 2 diabetes mellitus with unspecified complications: Secondary | ICD-10-CM | POA: Diagnosis not present

## 2019-10-08 DIAGNOSIS — I4891 Unspecified atrial fibrillation: Secondary | ICD-10-CM | POA: Diagnosis not present

## 2019-10-08 DIAGNOSIS — R7989 Other specified abnormal findings of blood chemistry: Secondary | ICD-10-CM

## 2019-10-08 DIAGNOSIS — I2510 Atherosclerotic heart disease of native coronary artery without angina pectoris: Secondary | ICD-10-CM | POA: Diagnosis not present

## 2019-10-08 DIAGNOSIS — G9340 Encephalopathy, unspecified: Secondary | ICD-10-CM | POA: Diagnosis not present

## 2019-10-08 DIAGNOSIS — E871 Hypo-osmolality and hyponatremia: Secondary | ICD-10-CM | POA: Diagnosis not present

## 2019-10-08 DIAGNOSIS — R6520 Severe sepsis without septic shock: Secondary | ICD-10-CM | POA: Diagnosis not present

## 2019-10-08 DIAGNOSIS — K760 Fatty (change of) liver, not elsewhere classified: Secondary | ICD-10-CM | POA: Diagnosis not present

## 2019-10-08 DIAGNOSIS — E119 Type 2 diabetes mellitus without complications: Secondary | ICD-10-CM | POA: Diagnosis not present

## 2019-10-08 DIAGNOSIS — E785 Hyperlipidemia, unspecified: Secondary | ICD-10-CM | POA: Diagnosis not present

## 2019-10-08 DIAGNOSIS — R0602 Shortness of breath: Secondary | ICD-10-CM | POA: Diagnosis not present

## 2019-10-08 DIAGNOSIS — R1312 Dysphagia, oropharyngeal phase: Secondary | ICD-10-CM | POA: Diagnosis not present

## 2019-10-08 DIAGNOSIS — Z79899 Other long term (current) drug therapy: Secondary | ICD-10-CM | POA: Diagnosis not present

## 2019-10-08 DIAGNOSIS — L89153 Pressure ulcer of sacral region, stage 3: Secondary | ICD-10-CM | POA: Diagnosis not present

## 2019-10-08 DIAGNOSIS — R509 Fever, unspecified: Secondary | ICD-10-CM | POA: Diagnosis not present

## 2019-10-08 DIAGNOSIS — E876 Hypokalemia: Secondary | ICD-10-CM | POA: Diagnosis not present

## 2019-10-08 DIAGNOSIS — R4182 Altered mental status, unspecified: Secondary | ICD-10-CM | POA: Diagnosis not present

## 2019-10-08 DIAGNOSIS — J9621 Acute and chronic respiratory failure with hypoxia: Secondary | ICD-10-CM | POA: Diagnosis not present

## 2019-10-08 DIAGNOSIS — R2681 Unsteadiness on feet: Secondary | ICD-10-CM | POA: Diagnosis not present

## 2019-10-08 DIAGNOSIS — R278 Other lack of coordination: Secondary | ICD-10-CM | POA: Diagnosis not present

## 2019-10-08 DIAGNOSIS — J9601 Acute respiratory failure with hypoxia: Secondary | ICD-10-CM | POA: Diagnosis not present

## 2019-10-08 DIAGNOSIS — E1151 Type 2 diabetes mellitus with diabetic peripheral angiopathy without gangrene: Secondary | ICD-10-CM | POA: Diagnosis not present

## 2019-10-08 DIAGNOSIS — Z23 Encounter for immunization: Secondary | ICD-10-CM | POA: Diagnosis not present

## 2019-10-08 DIAGNOSIS — R0689 Other abnormalities of breathing: Secondary | ICD-10-CM | POA: Diagnosis not present

## 2019-10-08 DIAGNOSIS — I959 Hypotension, unspecified: Secondary | ICD-10-CM | POA: Diagnosis not present

## 2019-10-08 DIAGNOSIS — Z7984 Long term (current) use of oral hypoglycemic drugs: Secondary | ICD-10-CM | POA: Diagnosis not present

## 2019-10-08 DIAGNOSIS — R41841 Cognitive communication deficit: Secondary | ICD-10-CM | POA: Diagnosis not present

## 2019-10-08 DIAGNOSIS — A419 Sepsis, unspecified organism: Secondary | ICD-10-CM | POA: Diagnosis not present

## 2019-10-08 DIAGNOSIS — Z85828 Personal history of other malignant neoplasm of skin: Secondary | ICD-10-CM | POA: Diagnosis not present

## 2019-10-08 DIAGNOSIS — M6281 Muscle weakness (generalized): Secondary | ICD-10-CM | POA: Diagnosis not present

## 2019-10-08 DIAGNOSIS — Z7901 Long term (current) use of anticoagulants: Secondary | ICD-10-CM | POA: Diagnosis not present

## 2019-10-08 DIAGNOSIS — J690 Pneumonitis due to inhalation of food and vomit: Secondary | ICD-10-CM | POA: Diagnosis not present

## 2019-10-08 DIAGNOSIS — I4892 Unspecified atrial flutter: Secondary | ICD-10-CM | POA: Diagnosis not present

## 2019-10-08 DIAGNOSIS — R1310 Dysphagia, unspecified: Secondary | ICD-10-CM | POA: Diagnosis not present

## 2019-10-08 DIAGNOSIS — Z209 Contact with and (suspected) exposure to unspecified communicable disease: Secondary | ICD-10-CM | POA: Diagnosis not present

## 2019-10-08 DIAGNOSIS — R0902 Hypoxemia: Secondary | ICD-10-CM | POA: Diagnosis not present

## 2019-10-08 DIAGNOSIS — Z8619 Personal history of other infectious and parasitic diseases: Secondary | ICD-10-CM | POA: Diagnosis not present

## 2019-10-08 DIAGNOSIS — Z66 Do not resuscitate: Secondary | ICD-10-CM | POA: Diagnosis not present

## 2019-10-08 DIAGNOSIS — E7800 Pure hypercholesterolemia, unspecified: Secondary | ICD-10-CM | POA: Diagnosis not present

## 2019-10-08 DIAGNOSIS — D6959 Other secondary thrombocytopenia: Secondary | ICD-10-CM | POA: Diagnosis not present

## 2019-10-08 DIAGNOSIS — E43 Unspecified severe protein-calorie malnutrition: Secondary | ICD-10-CM | POA: Diagnosis not present

## 2019-10-08 DIAGNOSIS — J8489 Other specified interstitial pulmonary diseases: Secondary | ICD-10-CM | POA: Diagnosis not present

## 2019-10-08 DIAGNOSIS — N390 Urinary tract infection, site not specified: Secondary | ICD-10-CM | POA: Diagnosis not present

## 2019-10-08 DIAGNOSIS — L89152 Pressure ulcer of sacral region, stage 2: Secondary | ICD-10-CM | POA: Diagnosis not present

## 2019-10-08 DIAGNOSIS — H9190 Unspecified hearing loss, unspecified ear: Secondary | ICD-10-CM | POA: Diagnosis not present

## 2019-10-08 DIAGNOSIS — Z743 Need for continuous supervision: Secondary | ICD-10-CM | POA: Diagnosis not present

## 2019-10-08 LAB — BASIC METABOLIC PANEL
Anion gap: 9 (ref 5–15)
BUN: 35 mg/dL — ABNORMAL HIGH (ref 8–23)
CO2: 25 mmol/L (ref 22–32)
Calcium: 8.8 mg/dL — ABNORMAL LOW (ref 8.9–10.3)
Chloride: 102 mmol/L (ref 98–111)
Creatinine, Ser: 0.5 mg/dL — ABNORMAL LOW (ref 0.61–1.24)
GFR calc Af Amer: >60 mL/min (ref >60)
GFR calc non Af Amer: >60 mL/min (ref >60)
Glucose, Bld: 140 mg/dL — ABNORMAL HIGH (ref 70–99)
Potassium: 4.6 mmol/L (ref 3.5–5.1)
Sodium: 136 mmol/L (ref 135–145)

## 2019-10-08 LAB — GLUCOSE, CAPILLARY
Glucose-Capillary: 155 mg/dL — ABNORMAL HIGH (ref 70–99)
Glucose-Capillary: 240 mg/dL — ABNORMAL HIGH (ref 70–99)
Glucose-Capillary: 252 mg/dL — ABNORMAL HIGH (ref 70–99)
Glucose-Capillary: 254 mg/dL — ABNORMAL HIGH (ref 70–99)

## 2019-10-08 NOTE — Progress Notes (Signed)
3 attempts made to reach RN at [facility]. [place] Environmental manager agree to deliver my contact info to the RN taking this patient.

## 2019-10-08 NOTE — Plan of Care (Signed)

## 2019-10-08 NOTE — Progress Notes (Signed)
Patient discharged to SNF([facility]), via stretcher, accompanied by transport staff. Discharge ppwk placed in packet and given to transport staff. PIV's removed. Condom catheter left in place for transport.   Report attempted multiple times to facility.The front desk staff took my number and stated they would deliver my contact information to the nurse receiving this patient. I never received a call for report.

## 2019-10-08 NOTE — TOC Transition Note (Addendum)
Transition of Care [facility]) - CM/SW Discharge Note   Patient Details  Name: Robert Cuevas MRN: [id number] Date of Birth: 1930-07-17  Transition of Care [facility]) CM/SW Contact:  Ninfa Meeker, RN Phone Number: [phone] (working remotely) 10/08/2019, 9:48 AM   Clinical Narrative:    Patient will DC to [place] date: 10/08/19 Family notified: Wife-Ruby Dondero Transport by: Corey Harold 11:00AM  Patient is medically ready for discharge to [facility] per MD. Charge nurse, bedside RN, family and facility are aware of planned discharge. Discharge Summary, FL2 sent to facility. Bedside RN to call report to [phone], patient going to room 805B, [facility]. Please include signed DNR. Ambulance transport requested.     Final next level of care: Skilled Nursing Facility Barriers to Discharge: No Barriers Identified   Patient Goals and CMS Choice Patient states their goals for this hospitalization and ongoing recovery are:: per wife: to get better and get home CMS Medicare.gov Compare Post Acute Care list provided to:: Patient Represenative (must comment) Choice offered to / list presented to : Spouse  Discharge Placement                       Discharge Plan and Services     Post Acute Care Choice: Skilled Nursing Facility                    HH Arranged: PT, OT [facility] Agency: [place] Date [facility] Agency Contacted: 10/07/19 Time HH Agency Contacted: [phone]    Social Determinants of Health (SDOH) Interventions     Readmission Risk Interventions Readmission Risk Prevention Plan 10/02/2019  Medication Review (RN CM) Complete  Some recent data might be hidden

## 2019-10-08 NOTE — Discharge Summary (Signed)
Physician Discharge Summary  Robert Cuevas [id number] DOB: 10-Feb-1930 DOA: 09/25/2019  [facility]  Admit date: 09/25/2019 Discharge date: 10/08/2019  Admitted From: Home Disposition:  SNF  Recommendations for Outpatient Follow-up:  1. Follow up with PCP in 1-2 weeks 2. Please obtain BMP/CBC in one week   Home Health:No Equipment/Devices:None  Discharge Condition:Stable CODE STATUS: DNR Diet recommendation: Heart Healthy   Brief/Interim Summary: 85 y.o. male past medical history of complete heart block status post pacemaker, essential hypertension, paroxysmal atrial fibrillation on Eliquis, chronic respiratory failure oxygen dependent, non-insulin-dependent diabetes mellitus tested positive for COVID-19 on 09/21/2019 now presenting with progressive shortness of breath to the emergency room on 09/25/2019 as he was developing shortness of breath and a nonproductive cough at [facility] ED was found to be profoundly hypoxic requiring BiPAP transferred to [facility].  Discharge Diagnoses:  Principal Problem:   Acute on chronic respiratory failure with hypoxia (HCC) Active Problems:   Complete heart block (HCC)   Paroxysmal atrial fibrillation ([place])   COVID-19 virus infection   Diabetes mellitus type II, non insulin dependent ([place])   Pneumonia due to COVID-19 virus   Hyperbilirubinemia   Pressure injury of skin   Diabetes mellitus type 2, uncontrolled, with complications (HCC)   Hypotension Acute on chronic respiratory failure with hypoxia secondary to COVID-19 pneumonia: Patient had to be placed on heated high flow on admission 100% oxygen to try to keep saturations greater than 92%. We will start empirically on IV remdesivir steroids vitamin C and zinc.  Inflammatory markers started to improve. He completed his course of IV remdesivir and steroids. He was weaned to his home dose of oxygen. He was discharged in stable condition.  Metabolic acidosis: Likely due to  Sirs now resolved.  Paroxysmal atrial fibrillation: Paced rhythm with a chads vas score of 4 he was continued on Eliquis no changes made.  Essential hypertension: Well-controlled no changes made.  Uncontrolled diabetes mellitus type 2 with hyperglycemia without complications: No changes made to his medication.  Elevated LFTs: Bilirubin AST and ALT were trending up, his alkaline phosphatase.  May nonremarkable. She CK was checked and it was 20 abdominal ultrasound showed fatty liver, no signs of stones. Acute hepatitis panel was nonreactive.  Pressure ulcer present on admission in the sacrum stage II   Discharge Instructions  Discharge Instructions    Diet - low sodium heart healthy   Complete by: As directed    Increase activity slowly   Complete by: As directed      Allergies as of 10/08/2019   No Known Allergies     Medication List    TAKE these medications   apixaban 5 MG Tabs tablet Commonly known as: ELIQUIS Take 1 tablet (5 mg total) by mouth 2 (two) times daily.   atorvastatin 20 MG tablet Commonly known as: LIPITOR Take 1 tablet (20 mg total) by mouth daily. What changed: how much to take   capsaicin-methyl sal-menthol 0.025-1-12 % Crea Generic drug: Capsaicin-Menthol-Methyl Sal Apply 1 application topically daily as needed (for knee pain).   carboxymethylcellulose 0.5 % Soln Commonly known as: REFRESH PLUS Place 1 drop into both eyes daily as needed (dry eyes).   cholecalciferol 25 MCG (1000 UT) tablet Commonly known as: VITAMIN D3 Take 1,000 Units by mouth daily.   Fish Oil 1200 MG Caps Take 2,400 mg by mouth daily.   metFORMIN 500 MG tablet Commonly known as: GLUCOPHAGE Take by mouth 2 (two) times daily with a meal.   metoprolol tartrate  50 MG tablet Commonly known as: LOPRESSOR Take 1 tablet (50 mg total) by mouth 2 (two) times daily.   ramipril 2.5 MG capsule Commonly known as: ALTACE Take 1 capsule by mouth daily.       No Known  Allergies  Consultations: None  Procedures/Studies: US Abdomen Complete  Result Date: 10/06/2019 CLINICAL DATA:  Elevated liver function tests.  Hyperbilirubinemia. EXAM: ABDOMEN ULTRASOUND COMPLETE COMPARISON:  CT chest 03/03/2013. FINDINGS: Gallbladder: No gallstones or wall thickening visualized. No sonographic Murphy sign noted by sonographer. Common bile duct: Diameter: 0.4 cm Liver: No focal lesion. The liver appears heterogeneous with increased echogenicity. Portal vein is patent on color Doppler imaging with normal direction of blood flow towards the liver. IVC: No abnormality visualized. Pancreas: Visualized portion unremarkable. Spleen: Size and appearance within normal limits. Right Kidney: Length: 9.0. Echogenicity within normal limits. No mass or hydronephrosis visualized. Left Kidney: Length: 9.3 cm. Echogenicity within normal limits. No mass or hydronephrosis visualized. Abdominal aorta: No aneurysm visualized. Other findings: Small right pleural effusion is seen. IMPRESSION: Fatty infiltration of the liver. Negative for biliary dilatation. Small right pleural effusion. Electronically Signed   By: Inge Rise M.D.   On: 10/06/2019 11:47   DG CHEST PORT 1 VIEW  Result Date: 09/29/2019 CLINICAL DATA:  COVID-19 pneumonia. EXAM: PORTABLE CHEST 1 VIEW COMPARISON:  Single view of the chest 09/25/2019. FINDINGS: Right worse than left airspace disease persists. Aeration in the lower lung zones has improved compared to the prior study. No pneumothorax or pleural effusion. Heart size is upper normal. Atherosclerosis and pacing device noted. IMPRESSION: Right worse than left airspace disease persists but has improved. Electronically Signed   By: Inge Rise M.D.   On: 09/29/2019 12:49   DG Chest Port 1 View  Result Date: 09/25/2019 CLINICAL DATA:  COVID-19 EXAM: PORTABLE CHEST 1 VIEW COMPARISON:  Portable exam [id number] hours compared to 09/24/2019 FINDINGS: RIGHT subclavian transvenous  pacemaker leads project over RIGHT atrium and RIGHT ventricle. Enlargement of cardiac silhouette. Atherosclerotic calcification aorta. Mediastinal contours and pulmonary vascularity normal. Patchy airspace infiltrates bilaterally consistent with multifocal pneumonia and history of COVID-19, greater on RIGHT. Skin fold projects over LEFT chest. No pleural effusion or pneumothorax. Diffuse osseous demineralization with BILATERAL glenohumeral degenerative changes and probable BILATERAL chronic rotator cuff tears. IMPRESSION: Patchy BILATERAL pulmonary infiltrates RIGHT greater than LEFT consistent with multifocal pneumonia and history of COVID-19, little changed. Aortic Atherosclerosis (ICD10-I70.0). Electronically Signed   By: Lavonia Dana M.D.   On: 09/25/2019 15:28    (Echo, Carotid, EGD, Colonoscopy, ERCP)    Subjective: No complaints feels great.  Discharge Exam: Vitals:   10/08/19 0120 10/08/19 0523  BP:  (!) 108/48  Pulse: 70 70  Resp: 18 (!) 21  Temp:  98 F (36.7 C)  SpO2: 98% 96%   Vitals:   10/07/19 2015 10/08/19 0015 10/08/19 0120 10/08/19 0523  BP: (!) 96/44 (!) 100/47  (!) 108/48  Pulse: 69 70 70 70  Resp: 20 20 18  (!) 21  Temp: 98.3 F (36.8 C) 97.8 F (36.6 C)  98 F (36.7 C)  TempSrc: Oral Oral  Oral  SpO2: 100% 99% 98% 96%  Weight:      Height:        General: Pt is alert, awake, not in acute distress Cardiovascular: RRR, S1/S2 +, no rubs, no gallops Respiratory: CTA bilaterally, no wheezing, no rhonchi Abdominal: Soft, NT, ND, bowel sounds + Extremities: no edema, no cyanosis    The results of  significant diagnostics from this hospitalization (including imaging, microbiology, ancillary and laboratory) are listed below for reference.     Microbiology: No results found for this or any previous visit (from the past 240 hour(s)).   Labs: BNP (last 3 results) No results for input(s): BNP in the last 8760 hours. Basic Metabolic Panel: Recent Labs  Lab  10/02/19 0033 10/03/19 0430 10/04/19 0350 10/07/19 0344 10/08/19 0254  NA 137 138 137 135 136  K 4.6 4.7 4.9 4.7 4.6  CL 98 98 100 99 102  CO2 28 28 27 27 25   GLUCOSE 89 96 137* 130* 140*  BUN 30* 33* 34* 35* 35*  CREATININE 0.56* 0.63 0.67 0.58* 0.50*  CALCIUM 8.9 9.0 8.7* 8.2* 8.8*  MG 1.8 1.9 1.9  --   --   PHOS 2.9 3.3 3.4  --   --    Liver Function Tests: Recent Labs  Lab 10/02/19 0033 10/03/19 0430 10/04/19 0350 10/07/19 0344  AST 61* 68* 75* 96*  ALT 45* 60* 72* 117*  ALKPHOS 145* 156* 143* 138*  BILITOT 2.9* 3.4* 3.5* 2.1*  PROT 6.0* 6.0* 5.9* 5.5*  ALBUMIN 3.0* 2.9* 3.2* 2.9*   No results for input(s): LIPASE, AMYLASE in the last 168 hours. No results for input(s): AMMONIA in the last 168 hours. CBC: Recent Labs  Lab 10/02/19 0033 10/03/19 0430 10/04/19 0350  WBC 13.4* 11.9* 10.7*  NEUTROABS 12.5* 10.8* 9.7*  HGB 17.1* 17.7* 17.2*  HCT 51.1 54.6* 52.0  MCV 96.1 96.0 95.4  PLT 122* 118* 103*   Cardiac Enzymes: Recent Labs  Lab 10/05/19 1350  CKTOTAL 20*   BNP: Invalid input(s): POCBNP CBG: Recent Labs  Lab 10/07/19 1708 10/07/19 1912 10/07/19 2332 10/08/19 0352 10/08/19 0707  GLUCAP 139* 229* 240* 155* 254*   D-Dimer No results for input(s): DDIMER in the last 72 hours. Hgb A1c No results for input(s): HGBA1C in the last 72 hours. Lipid Profile No results for input(s): CHOL, HDL, LDLCALC, TRIG, CHOLHDL, LDLDIRECT in the last 72 hours. Thyroid function studies No results for input(s): TSH, T4TOTAL, T3FREE, THYROIDAB in the last 72 hours.  Invalid input(s): FREET3 Anemia work up No results for input(s): VITAMINB12, FOLATE, FERRITIN, TIBC, IRON, RETICCTPCT in the last 72 hours. Urinalysis No results found for: COLORURINE, APPEARANCEUR, LABSPEC, [place], GLUCOSEU, HGBUR, BILIRUBINUR, KETONESUR, PROTEINUR, UROBILINOGEN, NITRITE, LEUKOCYTESUR Sepsis Labs Invalid input(s): PROCALCITONIN,  WBC,  LACTICIDVEN Microbiology No results found  for this or any previous visit (from the past 240 hour(s)).   Time coordinating discharge: Over 40 minutes  SIGNED:   Charlynne Cousins, MD  [facility] 10/08/2019, 7:26 AM Pager   If 7PM-7AM, please contact night-coverage [email or web address] Password [email or web address]

## 2019-10-11 ENCOUNTER — Inpatient Hospital Stay (HOSPITAL_COMMUNITY): Payer: No Typology Code available for payment source

## 2019-10-11 ENCOUNTER — Emergency Department (HOSPITAL_COMMUNITY): Payer: No Typology Code available for payment source

## 2019-10-11 ENCOUNTER — Encounter (HOSPITAL_COMMUNITY): Payer: Self-pay | Admitting: Emergency Medicine

## 2019-10-11 ENCOUNTER — Inpatient Hospital Stay (HOSPITAL_COMMUNITY)
Admission: EM | Admit: 2019-10-11 | Discharge: 2019-10-16 | DRG: 871 | Disposition: A | Discharge status: Skilled Nursing Facility | Payer: No Typology Code available for payment source | Source: Skilled Nursing Facility | Attending: Internal Medicine | Admitting: Internal Medicine

## 2019-10-11 ENCOUNTER — Other Ambulatory Visit: Payer: Self-pay

## 2019-10-11 DIAGNOSIS — M2550 Pain in unspecified joint: Secondary | ICD-10-CM | POA: Diagnosis not present

## 2019-10-11 DIAGNOSIS — I10 Essential (primary) hypertension: Secondary | ICD-10-CM | POA: Diagnosis present

## 2019-10-11 DIAGNOSIS — E871 Hypo-osmolality and hyponatremia: Secondary | ICD-10-CM | POA: Diagnosis present

## 2019-10-11 DIAGNOSIS — I480 Paroxysmal atrial fibrillation: Secondary | ICD-10-CM | POA: Diagnosis not present

## 2019-10-11 DIAGNOSIS — R6520 Severe sepsis without septic shock: Secondary | ICD-10-CM | POA: Diagnosis not present

## 2019-10-11 DIAGNOSIS — Z8619 Personal history of other infectious and parasitic diseases: Secondary | ICD-10-CM

## 2019-10-11 DIAGNOSIS — Z209 Contact with and (suspected) exposure to unspecified communicable disease: Secondary | ICD-10-CM | POA: Diagnosis not present

## 2019-10-11 DIAGNOSIS — J8489 Other specified interstitial pulmonary diseases: Secondary | ICD-10-CM | POA: Diagnosis not present

## 2019-10-11 DIAGNOSIS — A419 Sepsis, unspecified organism: Secondary | ICD-10-CM | POA: Diagnosis not present

## 2019-10-11 DIAGNOSIS — E1151 Type 2 diabetes mellitus with diabetic peripheral angiopathy without gangrene: Secondary | ICD-10-CM | POA: Diagnosis not present

## 2019-10-11 DIAGNOSIS — L89153 Pressure ulcer of sacral region, stage 3: Secondary | ICD-10-CM | POA: Diagnosis not present

## 2019-10-11 DIAGNOSIS — Z7984 Long term (current) use of oral hypoglycemic drugs: Secondary | ICD-10-CM | POA: Diagnosis not present

## 2019-10-11 DIAGNOSIS — R1310 Dysphagia, unspecified: Secondary | ICD-10-CM | POA: Diagnosis not present

## 2019-10-11 DIAGNOSIS — J9621 Acute and chronic respiratory failure with hypoxia: Secondary | ICD-10-CM | POA: Diagnosis not present

## 2019-10-11 DIAGNOSIS — R0602 Shortness of breath: Secondary | ICD-10-CM | POA: Diagnosis not present

## 2019-10-11 DIAGNOSIS — J9601 Acute respiratory failure with hypoxia: Secondary | ICD-10-CM | POA: Diagnosis not present

## 2019-10-11 DIAGNOSIS — J690 Pneumonitis due to inhalation of food and vomit: Secondary | ICD-10-CM | POA: Diagnosis not present

## 2019-10-11 DIAGNOSIS — N390 Urinary tract infection, site not specified: Secondary | ICD-10-CM | POA: Diagnosis not present

## 2019-10-11 DIAGNOSIS — G9340 Encephalopathy, unspecified: Secondary | ICD-10-CM | POA: Diagnosis present

## 2019-10-11 DIAGNOSIS — E785 Hyperlipidemia, unspecified: Secondary | ICD-10-CM | POA: Diagnosis not present

## 2019-10-11 DIAGNOSIS — E43 Unspecified severe protein-calorie malnutrition: Secondary | ICD-10-CM | POA: Diagnosis not present

## 2019-10-11 DIAGNOSIS — E876 Hypokalemia: Secondary | ICD-10-CM | POA: Diagnosis not present

## 2019-10-11 DIAGNOSIS — Z85828 Personal history of other malignant neoplasm of skin: Secondary | ICD-10-CM

## 2019-10-11 DIAGNOSIS — Z7901 Long term (current) use of anticoagulants: Secondary | ICD-10-CM

## 2019-10-11 DIAGNOSIS — E7800 Pure hypercholesterolemia, unspecified: Secondary | ICD-10-CM | POA: Diagnosis present

## 2019-10-11 DIAGNOSIS — R0902 Hypoxemia: Secondary | ICD-10-CM | POA: Diagnosis not present

## 2019-10-11 DIAGNOSIS — Z950 Presence of cardiac pacemaker: Secondary | ICD-10-CM | POA: Diagnosis not present

## 2019-10-11 DIAGNOSIS — E1169 Type 2 diabetes mellitus with other specified complication: Secondary | ICD-10-CM | POA: Diagnosis present

## 2019-10-11 DIAGNOSIS — Z7401 Bed confinement status: Secondary | ICD-10-CM | POA: Diagnosis not present

## 2019-10-11 DIAGNOSIS — Z66 Do not resuscitate: Secondary | ICD-10-CM | POA: Diagnosis not present

## 2019-10-11 DIAGNOSIS — I4891 Unspecified atrial fibrillation: Secondary | ICD-10-CM

## 2019-10-11 DIAGNOSIS — I442 Atrioventricular block, complete: Secondary | ICD-10-CM | POA: Diagnosis not present

## 2019-10-11 DIAGNOSIS — R52 Pain, unspecified: Secondary | ICD-10-CM | POA: Diagnosis not present

## 2019-10-11 DIAGNOSIS — I2510 Atherosclerotic heart disease of native coronary artery without angina pectoris: Secondary | ICD-10-CM | POA: Diagnosis present

## 2019-10-11 DIAGNOSIS — J189 Pneumonia, unspecified organism: Secondary | ICD-10-CM

## 2019-10-11 DIAGNOSIS — A4181 Sepsis due to Enterococcus: Secondary | ICD-10-CM | POA: Diagnosis not present

## 2019-10-11 DIAGNOSIS — U071 COVID-19: Secondary | ICD-10-CM | POA: Diagnosis not present

## 2019-10-11 DIAGNOSIS — H9190 Unspecified hearing loss, unspecified ear: Secondary | ICD-10-CM | POA: Diagnosis present

## 2019-10-11 DIAGNOSIS — I951 Orthostatic hypotension: Secondary | ICD-10-CM | POA: Diagnosis present

## 2019-10-11 DIAGNOSIS — R509 Fever, unspecified: Secondary | ICD-10-CM | POA: Diagnosis not present

## 2019-10-11 DIAGNOSIS — D6959 Other secondary thrombocytopenia: Secondary | ICD-10-CM | POA: Diagnosis present

## 2019-10-11 DIAGNOSIS — R0689 Other abnormalities of breathing: Secondary | ICD-10-CM | POA: Diagnosis not present

## 2019-10-11 DIAGNOSIS — R410 Disorientation, unspecified: Secondary | ICD-10-CM | POA: Diagnosis not present

## 2019-10-11 DIAGNOSIS — E119 Type 2 diabetes mellitus without complications: Secondary | ICD-10-CM | POA: Diagnosis not present

## 2019-10-11 DIAGNOSIS — R4182 Altered mental status, unspecified: Secondary | ICD-10-CM | POA: Diagnosis not present

## 2019-10-11 DIAGNOSIS — E1165 Type 2 diabetes mellitus with hyperglycemia: Secondary | ICD-10-CM | POA: Diagnosis present

## 2019-10-11 LAB — COMPREHENSIVE METABOLIC PANEL
ALT: 64 U/L — ABNORMAL HIGH (ref 0–44)
AST: 41 U/L (ref 15–41)
Albumin: 3.1 g/dL — ABNORMAL LOW (ref 3.5–5.0)
Alkaline Phosphatase: 126 U/L (ref 38–126)
Anion gap: 9 (ref 5–15)
BUN: 32 mg/dL — ABNORMAL HIGH (ref 8–23)
CO2: 23 mmol/L (ref 22–32)
Calcium: 8.6 mg/dL — ABNORMAL LOW (ref 8.9–10.3)
Chloride: 100 mmol/L (ref 98–111)
Creatinine, Ser: 0.77 mg/dL (ref 0.61–1.24)
GFR calc Af Amer: >60 mL/min (ref >60)
GFR calc non Af Amer: >60 mL/min (ref >60)
Glucose, Bld: 173 mg/dL — ABNORMAL HIGH (ref 70–99)
Potassium: 4.6 mmol/L (ref 3.5–5.1)
Sodium: 132 mmol/L — ABNORMAL LOW (ref 135–145)
Total Bilirubin: 3.2 mg/dL — ABNORMAL HIGH (ref 0.3–1.2)
Total Protein: 5.6 g/dL — ABNORMAL LOW (ref 6.5–8.1)

## 2019-10-11 LAB — CBC WITH DIFFERENTIAL/PLATELET
Abs Immature Granulocytes: 0.4 10*3/uL — ABNORMAL HIGH (ref 0.00–0.07)
Basophils Absolute: 0.1 10*3/uL (ref 0.0–0.1)
Basophils Relative: 1 %
Eosinophils Absolute: 0.1 10*3/uL (ref 0.0–0.5)
Eosinophils Relative: 0 %
HCT: 50.8 % (ref 39.0–52.0)
Hemoglobin: 17 g/dL (ref 13.0–17.0)
Immature Granulocytes: 2 %
Lymphocytes Relative: 2 %
Lymphs Abs: 0.5 10*3/uL — ABNORMAL LOW (ref 0.7–4.0)
MCH: 31.7 pg (ref 26.0–34.0)
MCHC: 33.5 g/dL (ref 30.0–36.0)
MCV: 94.8 fL (ref 80.0–100.0)
Monocytes Absolute: 1.6 10*3/uL — ABNORMAL HIGH (ref 0.1–1.0)
Monocytes Relative: 7 %
Neutro Abs: 21.3 10*3/uL — ABNORMAL HIGH (ref 1.7–7.7)
Neutrophils Relative %: 88 %
Platelets: 114 10*3/uL — ABNORMAL LOW (ref 150–400)
RBC: 5.36 MIL/uL (ref 4.22–5.81)
RDW: 14.6 % (ref 11.5–15.5)
WBC: 23.9 10*3/uL — ABNORMAL HIGH (ref 4.0–10.5)
nRBC: 0 % (ref 0.0–0.2)

## 2019-10-11 LAB — TROPONIN I (HIGH SENSITIVITY): Troponin I (High Sensitivity): 8 ng/L (ref <18)

## 2019-10-11 LAB — URINALYSIS, ROUTINE W REFLEX MICROSCOPIC
Glucose, UA: 100 mg/dL — AB
Ketones, ur: 15 mg/dL — AB
Nitrite: POSITIVE — AB
Protein, ur: >300 mg/dL — AB
Specific Gravity, Urine: 1.025 (ref 1.005–1.030)
pH: 6.5 (ref 5.0–8.0)

## 2019-10-11 LAB — HEPARIN LEVEL (UNFRACTIONATED): Heparin Unfractionated: 1.04 IU/mL — ABNORMAL HIGH (ref 0.30–0.70)

## 2019-10-11 LAB — BLOOD GAS, ARTERIAL
Acid-base deficit: 0.5 mmol/L (ref 0.0–2.0)
Bicarbonate: 22 mmol/L (ref 20.0–28.0)
Drawn by: 308601
FIO2: 100
O2 Saturation: 99.2 %
Patient temperature: 100
pCO2 arterial: 33.3 mmHg (ref 32.0–48.0)
pH, Arterial: 7.438 (ref 7.350–7.450)
pO2, Arterial: 168 mmHg — ABNORMAL HIGH (ref 83.0–108.0)

## 2019-10-11 LAB — LACTIC ACID, PLASMA: Lactic Acid, Venous: 1.5 mmol/L (ref 0.5–1.9)

## 2019-10-11 LAB — PROTIME-INR
INR: 1.4 — ABNORMAL HIGH (ref 0.8–1.2)
Prothrombin Time: 17.1 seconds — ABNORMAL HIGH (ref 11.4–15.2)

## 2019-10-11 LAB — URINALYSIS, MICROSCOPIC (REFLEX): RBC / HPF: >50 RBC/hpf (ref 0–5)

## 2019-10-11 LAB — GLUCOSE, CAPILLARY: Glucose-Capillary: 129 mg/dL — ABNORMAL HIGH (ref 70–99)

## 2019-10-11 LAB — FIBRINOGEN: Fibrinogen: 369 mg/dL (ref 210–475)

## 2019-10-11 LAB — CBG MONITORING, ED: Glucose-Capillary: 172 mg/dL — ABNORMAL HIGH (ref 70–99)

## 2019-10-11 LAB — APTT
aPTT: 34 seconds (ref 24–36)
aPTT: 94 seconds — ABNORMAL HIGH (ref 24–36)

## 2019-10-11 LAB — TSH: TSH: 1.136 u[IU]/mL (ref 0.350–4.500)

## 2019-10-11 LAB — PROCALCITONIN: Procalcitonin: 0.28 ng/mL

## 2019-10-11 MED ORDER — HEPARIN (PORCINE) 25000 UT/250ML-% IV SOLN
950.0000 [IU]/h | INTRAVENOUS | Status: DC
  Administered 2019-10-11: 12:00:00 950 [IU]/h via INTRAVENOUS
  Filled 2019-10-11: qty 250

## 2019-10-11 MED ORDER — ACETAMINOPHEN 325 MG PO TABS
650.0000 mg | ORAL_TABLET | Freq: Four times a day (QID) | ORAL | Status: DC | PRN
  Administered 2019-10-13 – 2019-10-14 (×2): 650 mg via ORAL
  Filled 2019-10-11 (×2): qty 2

## 2019-10-11 MED ORDER — ONDANSETRON HCL 4 MG/2ML IJ SOLN: 4.0000 mg | Freq: Four times a day (QID) | INTRAMUSCULAR | Status: DC | PRN

## 2019-10-11 MED ORDER — ACETAMINOPHEN 650 MG RE SUPP
650.0000 mg | Freq: Once | RECTAL | Status: AC
  Administered 2019-10-11: 07:00:00 650 mg via RECTAL
  Filled 2019-10-11: qty 1

## 2019-10-11 MED ORDER — SODIUM CHLORIDE 0.9 % IV BOLUS (SEPSIS)
1000.0000 mL | Freq: Once | INTRAVENOUS | Status: AC
  Administered 2019-10-11: 1000 mL via INTRAVENOUS

## 2019-10-11 MED ORDER — CARBOXYMETHYLCELLULOSE SODIUM 0.5 % OP SOLN: 1.0000 [drp] | Freq: Every day | OPHTHALMIC | Status: DC | PRN

## 2019-10-11 MED ORDER — SODIUM CHLORIDE 0.9 % IV SOLN: 2.0000 g | Freq: Once | INTRAVENOUS | Status: DC

## 2019-10-11 MED ORDER — SODIUM CHLORIDE 0.9 % IV BOLUS
1000.0000 mL | Freq: Once | INTRAVENOUS | Status: AC
  Administered 2019-10-11: 1000 mL via INTRAVENOUS

## 2019-10-11 MED ORDER — BISACODYL 10 MG RE SUPP
10.0000 mg | Freq: Every day | RECTAL | Status: DC | PRN
  Filled 2019-10-11: qty 1

## 2019-10-11 MED ORDER — VANCOMYCIN HCL IN DEXTROSE 1-5 GM/200ML-% IV SOLN: 1000.0000 mg | Freq: Once | INTRAVENOUS | Status: DC

## 2019-10-11 MED ORDER — SODIUM CHLORIDE 0.9 % IV SOLN: INTRAVENOUS | Status: DC

## 2019-10-11 MED ORDER — SODIUM CHLORIDE 0.9 % IV SOLN
2.0000 g | Freq: Once | INTRAVENOUS | Status: AC
  Administered 2019-10-11: 2 g via INTRAVENOUS
  Filled 2019-10-11: qty 2

## 2019-10-11 MED ORDER — POLYVINYL ALCOHOL 1.4 % OP SOLN
1.0000 [drp] | OPHTHALMIC | Status: DC | PRN
  Filled 2019-10-11: qty 15

## 2019-10-11 MED ORDER — SODIUM CHLORIDE 0.9 % IV BOLUS (SEPSIS)
1000.0000 mL | Freq: Once | INTRAVENOUS | Status: AC
  Administered 2019-10-11: 07:00:00 1000 mL via INTRAVENOUS

## 2019-10-11 MED ORDER — COLLAGENASE 250 UNIT/GM EX OINT
1.0000 "application " | TOPICAL_OINTMENT | Freq: Every day | CUTANEOUS | Status: DC
  Administered 2019-10-11 – 2019-10-16 (×6): 1 via TOPICAL
  Filled 2019-10-11 (×2): qty 30

## 2019-10-11 MED ORDER — ACETAMINOPHEN 650 MG RE SUPP: 650.0000 mg | Freq: Four times a day (QID) | RECTAL | Status: DC | PRN

## 2019-10-11 MED ORDER — VANCOMYCIN HCL IN DEXTROSE 1-5 GM/200ML-% IV SOLN
1000.0000 mg | INTRAVENOUS | Status: DC
  Administered 2019-10-12 – 2019-10-14 (×3): 1000 mg via INTRAVENOUS
  Filled 2019-10-11 (×3): qty 200

## 2019-10-11 MED ORDER — IPRATROPIUM-ALBUTEROL 20-100 MCG/ACT IN AERS
1.0000 | INHALATION_SPRAY | Freq: Four times a day (QID) | RESPIRATORY_TRACT | Status: DC
  Administered 2019-10-11 – 2019-10-16 (×20): 1 via RESPIRATORY_TRACT
  Filled 2019-10-11: qty 4

## 2019-10-11 MED ORDER — METRONIDAZOLE IN NACL 5-0.79 MG/ML-% IV SOLN
500.0000 mg | Freq: Once | INTRAVENOUS | Status: AC
  Administered 2019-10-11: 07:00:00 500 mg via INTRAVENOUS
  Filled 2019-10-11: qty 100

## 2019-10-11 MED ORDER — SODIUM CHLORIDE 0.9 % IV SOLN
2.0000 g | Freq: Two times a day (BID) | INTRAVENOUS | Status: DC
  Administered 2019-10-11 – 2019-10-14 (×5): 2 g via INTRAVENOUS
  Filled 2019-10-11 (×6): qty 2

## 2019-10-11 MED ORDER — VANCOMYCIN HCL IN DEXTROSE 1-5 GM/200ML-% IV SOLN
1000.0000 mg | Freq: Once | INTRAVENOUS | Status: AC
  Administered 2019-10-11: 1000 mg via INTRAVENOUS
  Filled 2019-10-11: qty 200

## 2019-10-11 MED ORDER — ONDANSETRON HCL 4 MG PO TABS
4.0000 mg | ORAL_TABLET | Freq: Four times a day (QID) | ORAL | Status: DC | PRN
  Administered 2019-10-14 – 2019-10-15 (×2): 4 mg via ORAL
  Filled 2019-10-11 (×2): qty 1

## 2019-10-11 MED ORDER — METRONIDAZOLE IN NACL 5-0.79 MG/ML-% IV SOLN
500.0000 mg | Freq: Three times a day (TID) | INTRAVENOUS | Status: DC
  Administered 2019-10-11 – 2019-10-16 (×14): 500 mg via INTRAVENOUS
  Filled 2019-10-11 (×17): qty 100

## 2019-10-11 NOTE — Plan of Care (Signed)
  Problem: Education: Goal: Knowledge of risk factors and measures for prevention of condition will improve Outcome: Progressing   Problem: Coping: Goal: Psychosocial and spiritual needs will be supported Outcome: Progressing   Problem: Respiratory: Goal: Will maintain a patent airway Outcome: Progressing Goal: Complications related to the disease process, condition or treatment will be avoided or minimized Outcome: Progressing   Problem: Activity: Goal: Ability to tolerate increased activity will improve Outcome: Progressing   Problem: Clinical Measurements: Goal: Ability to maintain a body temperature in the normal range will improve Outcome: Progressing   Problem: Respiratory: Goal: Ability to maintain adequate ventilation will improve Outcome: Progressing Goal: Ability to maintain a clear airway will improve Outcome: Progressing   Problem: Urinary Elimination: Goal: Signs and symptoms of infection will decrease Outcome: Progressing

## 2019-10-11 NOTE — Progress Notes (Signed)
Pharmacy Antibiotic Note  Robert Cuevas is a 85 y.o. male admitted on 10/11/2019 with sepsis.  Pharmacy has been consulted for vancomycin and cefepime dosing.  Pt recently admitted to [facility] from 12/6 to 12/19 for COVID-19 treatment.   Plan:  Cefepime 2 gr IV q12h    Vancomycin 1000 mg IV q24h.  Metronidazole 500 mg IV q8h   Monitor clinical course, renal function, cultures as available   Height: 5\' 8"  (172.7 cm) Weight: 140 lb (63.5 kg) IBW/kg (Calculated) : 68.4  Temp (24hrs), Avg:98.8 F (37.1 C), Min:97.5 F (36.4 C), Max:100 F (37.8 C)  Recent Labs  Lab 10/07/19 0344 10/08/19 0254 10/11/19 0623  WBC  --   --  23.9*  CREATININE 0.58* 0.50* 0.77  LATICACIDVEN  --   --  1.5    Estimated Creatinine Clearance: 58.4 mL/min (by C-G formula based on SCr of 0.77 mg/dL).    No Known Allergies  Antimicrobials this admission: 12/22 vancomycin >>  12/22 cefepime >>  12/22 metronidazole >>   Dose adjustments this admission:   Microbiology results: 12/22 BCx:   Thank you for allowing pharmacy to be a part of this patient's care.   Royetta Asal, PharmD, BCPS 10/11/2019 8:50 AM

## 2019-10-11 NOTE — Progress Notes (Signed)
A consult was received from an ED physician for cefepime and vancomycin per pharmacy dosing.  The patient's profile has been reviewed for ht/wt/allergies/indication/available labs.   A one time order has been placed for Cefepime 2 Gm and Vancomycin 1 Gm.  Further antibiotics/pharmacy consults should be ordered by admitting physician if indicated.                       Thank you, Dorrene German 10/11/2019  6:41 AM

## 2019-10-11 NOTE — ED Provider Notes (Signed)
TIME SEEN: 6:27 AM  CHIEF COMPLAINT: Shortness of breath  HPI: Patient is an 85 year old male with history of complete heart block status post pacemaker placement, hypertension, hyperlipidemia, A. fib on Eliquis who presents to the emergency department with complaints of shortness of breath.  Patient recently diagnosed with COVID-19 on 09/21/2019.  Was admitted to [facility] 09/25/19 - 10/08/19 and received IV remdesivir, Decadron, vitamin C and zinc.  Was discharged to [facility] on 5 L nasal cannula.  Did require BiPAP at [facility] and then heated high flow nasal cannula during his hospitalization.  Discussed with patient's nurse at [facility]  She states patient was c/o SOB tonight.  On 5L [place] and was satting 88%.  Has been at [facility] since [place] from [facility] 10/08/19.  Was normotensive tonight at [facility] but hypotensive with EMS.  Discussed with patient's wife Ruby.  She states patient is DNR/DNI but agrees with IV fluids, IV antibiotics and admission.  Patient states the reason he is here is because he has a sore place on his bottom.  He denies chest pain, shortness of breath.  Denies vomiting or diarrhea.  He is unaware of any recent fever.  ROS: Level 5 caveat secondary to some confusion  PAST MEDICAL HISTORY/PAST SURGICAL HISTORY:  Past Medical History:  Diagnosis Date  . Cancer ([place])    skin cancer removed from head  . Complete heart block ([place])    a. s/p gen change 07/2011. b. Pacer pocket infx 02/2013 - s/p extraction, temp perm placement, then eventual implantation of new St. Jude pacemaker 03/04/13.  . Coronary artery calcification    Seen on CT 02/2013.  . High cholesterol   . HOH (hard of hearing)   . Hyperglycemia   . Hypertension   . Pacemaker- St Jude  03/06/2013   Extracted 5/14 Reimplant 5/14   . Paroxysmal atrial fibrillation ([place]) 01/04/2015    MEDICATIONS:  Prior to Admission medications   Medication Sig Start Date End Date Taking?  Authorizing Provider  apixaban (ELIQUIS) 5 MG TABS tablet Take 1 tablet (5 mg total) by mouth 2 (two) times daily. 12/05/16   Croitoru, Mihai, MD  atorvastatin (LIPITOR) 20 MG tablet Take 1 tablet (20 mg total) by mouth daily. Patient taking differently: Take 10 mg by mouth daily.  03/03/18   Croitoru, Mihai, MD  Capsaicin-Menthol-Methyl Sal (CAPSAICIN-METHYL SAL-MENTHOL) 0.025-1-12 % CREA Apply 1 application topically daily as needed (for knee pain).    [provider]  carboxymethylcellulose (REFRESH PLUS) 0.5 % SOLN Place 1 drop into both eyes daily as needed (dry eyes).    [provider]  cholecalciferol (VITAMIN D3) 25 MCG (1000 UT) tablet Take 1,000 Units by mouth daily.    [provider]  metFORMIN (GLUCOPHAGE) 500 MG tablet Take by mouth 2 (two) times daily with a meal.    [provider]  metoprolol (LOPRESSOR) 50 MG tablet Take 1 tablet (50 mg total) by mouth 2 (two) times daily. 03/07/13   Theora Gianotti, NP  Omega-3 Fatty Acids (FISH OIL) 1200 MG CAPS Take 2,400 mg by mouth daily.    [provider]  ramipril (ALTACE) 2.5 MG capsule Take 1 capsule by mouth daily. 02/27/18   [provider]    ALLERGIES:  No Known Allergies  SOCIAL HISTORY:  Social History   Tobacco Use  . Smoking status: Never Smoker  . Smokeless tobacco: Never Used  Substance Use Topics  . Alcohol use: No  FAMILY HISTORY: No family history on file.  EXAM: BP (!) 82/42   Pulse 70   Temp (!) 97.5 F (36.4 C) (Axillary)   Resp 15   Ht 5\' 8"  (1.727 m)   Wt 63.5 kg   SpO2 (!) 86%   BMI 21.29 kg/m  CONSTITUTIONAL: Alert and oriented to person and year but not place and responds appropriately to questions.  Elderly, thin, rectal temperature of 100.0 HEAD: Normocephalic EYES: Conjunctivae clear, pupils appear equal, EOM appear intact ENT: normal nose; moist mucous membranes NECK: Supple, normal ROM CARD: RRR; S1 and S2 appreciated; no  murmurs, no clicks, no rubs, no gallops RESP: Patient is tachypneic.  Hypoxic on 6 L nasal cannula.  Coarse breath sounds bilaterally.  No wheezing. ABD/GI: Normal bowel sounds; non-distended; soft, non-tender, no rebound, no guarding, no peritoneal signs, no hepatosplenomegaly BUTTOCK: Patient has a 3 x 2 cm stage III ulcer with surrounding redness without warmth to the sacral region without drainage or bleeding BACK:  The back appears normal EXT: Normal ROM in all joints; no deformity noted, no edema; no cyanosis SKIN: Normal color for age and race; warm; no rash on exposed skin NEURO: Moves all extremities equally, normal speech, no facial asymmetry PSYCH: The patient's mood and manner are appropriate.   MEDICAL DECISION MAKING: Patient here with septic shock.  Rectal temp of 100.  Currently blood pressures in the 70s/50s.  Sats of 83% on 6 L nasal cannula.  Sats of 88-89% on nonrebreather.  Respiratory therapy called to bedside for ABG and to place patient on high flow nasal cannula.  Recently admitted for Covid pneumonia requiring oxygen supplementation.  Concern for recurrent pneumonia today, UTI, bacteremia.  Will obtain labs, cultures.  Will give 78mL/kg IV fluid bolus and broad-spectrum antibiotics.  Updated patient's wife.  She reports he is a DNR/DNI.  ED PROGRESS: Blood pressure improving with IV hydration.  Currently in the 100s/40s.  He is doing quite well on high flow nasal cannula.  Chest x-ray shows multifocal pneumonia.  Right lung disease has progressed compared to prior x-ray on 09/29/2019.  ABG is reassuring.  Lactate normal.  He also appears to have a urinary tract infection.  Has leukocytosis of 23,000 with left shift.  We will continue IV hydration.  Will discuss with medicine for admission to stepdown.  7:42 AM Discussed patient's case with hospitalist, Dr. Alfredia Ferguson.  I have recommended admission and patient (and family if present) agree with this plan. Admitting physician will  place admission orders.   Blood pressure continues to improve as this patient's mentation.  Patient's wife has been updated.  Oncoming ED physician aware and will follow up on pending head CT.   I reviewed all nursing notes, vitals, pertinent previous records and interpreted all EKGs, lab and urine results, imaging (as available).    EKG Interpretation  Date/Time:  Tuesday October 11 2019 06:31:10 EST Ventricular Rate:  70 PR Interval:    QRS Duration: 170 QT Interval:  468 QTC Calculation: 505 R Axis:   -75 Text Interpretation: Ventricular-paced rhythm No significant change since last tracing Confirmed by Pryor Curia [phone]) on 10/11/2019 6:45:12 AM        CRITICAL CARE Performed by: Cyril Mourning Genevie Elman   Total critical care time: 65 minutes  Critical care time was exclusive of separately billable procedures and treating other patients.  Critical care was necessary to treat or prevent imminent or life-threatening deterioration.  Critical care was time spent personally by me on the  following activities: development of treatment plan with patient and/or surrogate as well as nursing, discussions with consultants, evaluation of patient's response to treatment, examination of patient, obtaining history from patient or surrogate, ordering and performing treatments and interventions, ordering and review of laboratory studies, ordering and review of radiographic studies, pulse oximetry and re-evaluation of patient's condition.   GINGER NOYOLA was evaluated in Emergency Department on 10/11/2019 for the symptoms described in the history of present illness. He was evaluated in the context of the global COVID-19 pandemic, which necessitated consideration that the patient might be at risk for infection with the SARS-CoV-2 virus that causes COVID-19. Institutional protocols and algorithms that pertain to the evaluation of patients at risk for COVID-19 are in a state of rapid change based on  information released by regulatory bodies including the CDC and federal and state organizations. These policies and algorithms were followed during the patient's care in the ED.  Patient was seen wearing N95, face shield, gloves, gown.    Sarissa Dern, Delice Bison, DO 10/11/19 [phone]

## 2019-10-11 NOTE — ED Notes (Signed)
Unable to obtain oral temp 

## 2019-10-11 NOTE — ED Triage Notes (Signed)
Pt arrived via [facility] from [facility] with complaints of SOB. Pt SpO2 sat at 86% on RA. Pt stated he just wanted to get off his bedsore. Pt alert & oriented x3 and was recently discharged from [facility].   BP 80/44 RR 32 HR 78 SpO2 95% Non-rebreather  CBG 227

## 2019-10-11 NOTE — ED Provider Notes (Signed)
Ct head reviewed.  No acute change.   Robert Cuevas, [place] 10/11/19 1048

## 2019-10-11 NOTE — H&P (Addendum)
History and Physical    MAURO BOUCH [id number] DOB: [birth year] DOA: 10/11/2019  PCP: [facility]   Patient coming from: SNF, [facility]  Chief Complaint: SOB, Buttock Pain  HPI: Robert Cuevas is a 85 y.o. male with a PMH significant for but not limited too skin cancer, complete heart block status post pacemaker placement, hyperlipidemia, hypertension, diabetes mellitus type 2, proximal atrial fibrillation on anticoagulation with Eliquis and other comorbidities who was just recently discharged from [facility] for COVID-19 disease and pneumonia on 10/08/2019.  He was diagnosed with COVID-19 on 09/21/2019 and is admitted to [facility] on 09/25/2019 until 10/08/2019 and had received IV remdesivir, Decadron remained seizing at that time.  He was discharged reportedly on 1 L per my discussion with the discharging physician and was placed on 5 L nasal cannula at [facility] place.  Initially he required heated high flow nasal cannula during that hospitalization.  Yesterday night the patient became short of breath and on 5 L nasal cannula he is saturating in the mid 80s and low 80s.  He was sent over for further evaluation recommendation had to be placed on a nonrebreather when he arrived.  Initially he was normotensive and cannabis but was hypotensive in the emergency room.  Further work-up revealed that he had likely had aspiration pneumonia and a urinary tract infection so he started on sepsis protocol.  Of note patient was confused when he came in and he is improving but still slightly confused so subjective history is not very reliable from the patient.  He keeps telling me that the reason he came in is because of a sore on his bottom that his main complaint.  He denied any chest pain, lightheadedness or dizziness.  TRH was asked to admit this patient for acute on chronic hypoxic respiratory failure in the setting of likely aspiration pneumonia as well as sepsis secondary  to pneumonia and urinary tract infection.  ED Course: In the ED patient had basic blood work done as well as an ABG on a nonrebreather and he also had an EKG done.  He was given sepsis protocol with 30 cc/kg initial boluses and was also started on broad-spectrum antibiotics with IV vancomycin, IV cefepime, IV Flagyl.  Case was discussed with the discharging physician from [facility] who recommended transferring back to [facility] for further evaluation recommendation given the need for isolation bed needed at [facility].  Review of Systems: As per HPI otherwise all other systems reviewed and negative.   Past Medical History:  Diagnosis Date  . Cancer ([place])    skin cancer removed from head  . Complete heart block ([place])    a. s/p gen change 07/2011. b. Pacer pocket infx 02/2013 - s/p extraction, temp perm placement, then eventual implantation of new St. Jude pacemaker 03/04/13.  . Coronary artery calcification    Seen on CT 02/2013.  . High cholesterol   . HOH (hard of hearing)   . Hyperglycemia   . Hypertension   . Pacemaker- St Jude  03/06/2013   Extracted 5/14 Reimplant 5/14   . Paroxysmal atrial fibrillation ([place]) 01/04/2015   Past Surgical History:  Procedure Laterality Date  . GENERATOR REMOVAL N/A 03/02/2013   Procedure: GENERATOR REMOVAL;  Surgeon: Evans Lance, MD;  Location: [place];  Service: Cardiovascular;  Laterality: N/A;  . ICD LEAD REMOVAL N/A 03/02/2013   Procedure: ICD LEAD REMOVAL;  Surgeon: Evans Lance, MD;  Location: [place];  Service:  Cardiovascular;  Laterality: N/A;  . INGUINAL HERNIA REPAIR Bilateral   . PACEMAKER GENERATOR CHANGE N/A 09/19/2011   Procedure: PACEMAKER GENERATOR CHANGE;  Surgeon: Sanda Klein, MD;  Location: [place] CATH LAB;  Service: Cardiovascular;  Laterality: N/A;  . PACEMAKER INSERTION    . PACEMAKER LEAD REMOVAL N/A 03/02/2013   Procedure: PACEMAKER LEAD REMOVAL;  Surgeon: Evans Lance, MD;  Location: [place];  Service: Cardiovascular;   Laterality: N/A;  . PERMANENT PACEMAKER INSERTION N/A 03/04/2013   Procedure: PERMANENT PACEMAKER INSERTION;  Surgeon: Evans Lance, MD;  Location: [facility] CATH LAB;  Service: Cardiovascular;  Laterality: N/A;   SOCIAL HISTORY  reports that he has never smoked. He has never used smokeless tobacco. He reports that he does not drink alcohol or use drugs.  ALLERGIES No Known Allergies  FAMILY HISTORY Patient's Mother had Colorectal Cancer   Prior to Admission medications   Medication Sig Start Date End Date Taking? Authorizing Provider  apixaban (ELIQUIS) 5 MG TABS tablet Take 1 tablet (5 mg total) by mouth 2 (two) times daily. 12/05/16  Yes Croitoru, Mihai, MD  atorvastatin (LIPITOR) 20 MG tablet Take 1 tablet (20 mg total) by mouth daily. 03/03/18  Yes Croitoru, Mihai, MD  carboxymethylcellulose (REFRESH PLUS) 0.5 % SOLN Place 1 drop into both eyes daily as needed (dry eyes).   Yes [provider]  cholecalciferol (VITAMIN D3) 25 MCG (1000 UT) tablet Take 1,000 Units by mouth daily.   Yes [provider]  collagenase (SANTYL) ointment Apply 1 application topically daily. Apply nickel thickness amount to sacrum wound daily   Yes [provider]  diclofenac Sodium (VOLTAREN) 1 % GEL Apply 1 application topically daily as needed (knee pain).   Yes [provider]  metFORMIN (GLUCOPHAGE) 500 MG tablet Take by mouth 2 (two) times daily with a meal.   Yes [provider]  metoprolol (LOPRESSOR) 50 MG tablet Take 1 tablet (50 mg total) by mouth 2 (two) times daily. 03/07/13  Yes Theora Gianotti, NP  Omega-3 Fatty Acids (FISH OIL) 1200 MG CAPS Take 2,400 mg by mouth daily.   Yes [provider]  ramipril (ALTACE) 2.5 MG capsule Take 2.5 mg by mouth daily.  02/27/18  Yes [provider]   Physical Exam: Vitals:   10/11/19 0732 10/11/19 0745 10/11/19 0815 10/11/19 1037  BP:  (!) 103/47 (!) 94/54 (!) 95/45  Pulse:  70 70 70  Resp:   14 19 (!) 29  Temp: 100 F (37.8 C)     TempSrc: Rectal     SpO2:  100% 98% 100%  Weight:      Height:       Constitutional: Thin Frail Elderly Chronically ill appearing Caucasian male who appears slightly uncomfortable  Eyes: Lids and conjunctivae normal, sclerae anicteric  ENMT: External Ears, Nose appear normal. Slightly hard of hearing.  Neck: Appears normal, supple, no cervical masses, normal ROM, no appreciable thyromegaly; no JVD Respiratory: Diminihsed to auscultation bilaterally at the bases with coarse breath sounds, no wheezing, rales, rhonchi or crackles. Has slight tachypnea but wearing 15 Liters of HFNC Cardiovascular: RRR , no murmurs / rubs / gallops. S1 and S2 auscultated. No extremity edema.  Abdomen: Soft, non-tender, non-distended. N Bowel sounds positive.  GU: Deferred. Musculoskeletal: No clubbing / cyanosis of digits/nails. No joint deformity upper and lower extremities.  Skin: Has a Sacral Decubitus Ulcer and Skin breakdown. No induration; Warm and dry.  Neurologic: CN 2-12 grossly intact with no focal  deficits. Romberg sign and cerebellar reflexes not assessed.  Psychiatric: Impaired judgment and insight. Alert and oriented x 3. Normal mood and appropriate affect.   Labs on Admission: I have personally reviewed following labs and imaging studies  CBC: Recent Labs  Lab 10/11/19 0623  WBC 23.9*  NEUTROABS 21.3*  HGB 17.0  HCT 50.8  MCV 94.8  PLT [id number]*   Basic Metabolic Panel: Recent Labs  Lab 10/07/19 0344 10/08/19 0254 10/11/19 0623  NA 135 136 132*  K 4.7 4.6 4.6  CL 99 102 100  CO2 27 25 23   GLUCOSE 130* 140* 173*  BUN 35* 35* 32*  CREATININE 0.58* 0.50* 0.77  CALCIUM 8.2* 8.8* 8.6*   GFR: Estimated Creatinine Clearance: 58.4 mL/min (by C-G formula based on SCr of 0.77 mg/dL). Liver Function Tests: Recent Labs  Lab 10/07/19 0344 10/11/19 0623  AST 96* 41  ALT 117* 64*  ALKPHOS 138* 126  BILITOT 2.1* 3.2*  PROT 5.5* 5.6*  ALBUMIN  2.9* 3.1*   No results for input(s): LIPASE, AMYLASE in the last 168 hours. No results for input(s): AMMONIA in the last 168 hours. Coagulation Profile: Recent Labs  Lab 10/11/19 0623  INR 1.4*   Cardiac Enzymes: Recent Labs  Lab 10/05/19 1350  CKTOTAL 20*   BNP (last 3 results) No results for input(s): PROBNP in the last 8760 hours. HbA1C: No results for input(s): HGBA1C in the last 72 hours. CBG: Recent Labs  Lab 10/07/19 2332 10/08/19 0352 10/08/19 0707 10/08/19 1105 10/11/19 0634  GLUCAP 240* 155* 254* 252* 172*   Lipid Profile: No results for input(s): CHOL, HDL, LDLCALC, TRIG, CHOLHDL, LDLDIRECT in the last 72 hours. Thyroid Function Tests: No results for input(s): TSH, T4TOTAL, FREET4, T3FREE, THYROIDAB in the last 72 hours. Anemia Panel: No results for input(s): VITAMINB12, FOLATE, FERRITIN, TIBC, IRON, RETICCTPCT in the last 72 hours. Urine analysis:    Component Value Date/Time   COLORURINE BROWN (A) 10/11/2019 0642   APPEARANCEUR TURBID (A) 10/11/2019 0642   LABSPEC 1.025 10/11/2019 0642   PHURINE 6.5 10/11/2019 0642   GLUCOSEU 100 (A) 10/11/2019 0642   HGBUR LARGE (A) 10/11/2019 0642   BILIRUBINUR LARGE (A) 10/11/2019 0642   KETONESUR 15 (A) 10/11/2019 0642   PROTEINUR >300 (A) 10/11/2019 0642   NITRITE POSITIVE (A) 10/11/2019 0642   LEUKOCYTESUR MODERATE (A) 10/11/2019 0642   Sepsis Labs: !!!!!!!!!!!!!!!!!!!!!!!!!!!!!!!!!!!!!!!!!!!! @LABRCNTIP (procalcitonin:4,lacticidven:4) )No results found for this or any previous visit (from the past 240 hour(s)).   Radiological Exams on Admission: CT Head Wo Contrast  Result Date: 10/11/2019 CLINICAL DATA:  Altered mental status. COVID-19 positive with hypoxia EXAM: CT HEAD WITHOUT CONTRAST TECHNIQUE: Contiguous axial images were obtained from the base of the skull through the vertex without intravenous contrast. COMPARISON:  None. FINDINGS: Brain: There is mild diffuse atrophy. There is a cavum septum  pellucidum, an anatomic variant. There is no intracranial mass, hemorrhage, extra-axial fluid collection, or midline shift. There is patchy small vessel disease throughout the centra semiovale bilaterally. There is evidence of a prior infarct involving a portion of the anterior limb of the left external capsule. Scattered lacunar type infarcts are noted in the centra semiovale bilaterally. There is decreased attenuation at the gray-white junction the left mid frontal lobe, likely chronic. No acute appearing infarct is evident on this study. Vascular: There is no hyperdense vessel. There is calcification in each carotid siphon region. Skull: The bony calvarium appears intact. Sinuses/Orbits: There is mucosal thickening in several ethmoid air cells. There are  air-fluid levels in the left and right sphenoid sinus regions. Other paranasal sinuses are clear. Orbits appear symmetric bilaterally. Other: The mastoid air cells are clear. IMPRESSION: 1. Atrophy with extensive supratentorial small vessel disease. 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 Probable prior infarct at the gray-white junction of the mid left frontal lobe. No acute appearing infarct is evident. No mass or hemorrhage. 2.  There are foci of arterial vascular calcification. 3. Areas of paranasal sinus disease, most notably in the sphenoid sinus regions with air-fluid levels in the sphenoid sinus regions bilaterally. Electronically Signed   By: Lowella Grip III M.D.   On: 10/11/2019 10:27   DG Chest Port 1 View  Result Date: 10/11/2019 CLINICAL DATA:  Fever.  Hypoxia.  History of COVID-19. EXAM: PORTABLE CHEST 1 VIEW COMPARISON:  09/29/2019. FINDINGS: Cardiac pacer with lead tips over the right atrium right ventricle. Stable cardiomegaly. Diffuse bilateral pulmonary infiltrates, right side greater than left. Diffuse right lung infiltrates progressed from prior exam. Low lung volumes particularly  on the right. No pleural effusion or pneumothorax. IMPRESSION: 1.  Cardiac pacer noted stable position.  Stable cardiomegaly. 2. Diffuse bilateral pulmonary infiltrates, right side greater than left. Diffuse right lung has progressed from prior exam. Low lung volumes, particularly on the right. Electronically Signed   By: Marcello Moores  Register   On: 10/11/2019 07:10    EKG: Independently reviewed.  Showed a sinus rhythm at a rate of 70 and a left bundle branch block and a QTC of 505  Assessment/Plan Active Problems:   Complete heart block (HCC)   Hypoxia   Pacemaker- St Jude    Dyspnea   Paroxysmal atrial fibrillation ([place])   Long term (current) use of anticoagulants   Acute on chronic respiratory failure with hypoxia (HCC)   Pneumonia due to COVID-19 virus   Pressure injury of skin   Diabetes mellitus type 2, uncontrolled, with complications ([place])   Hypotension   Sepsis ([place])  Sepsis 2/2 to ? Aspiration PNA with concomitant UTI, poA in the setting of Recent COVID Infection -Admit to Inpatient SDU and will send to [facility] given Recent Admission there and Discharge on 12/19 -Will still need an Isolation bed and have Airborne and Contact Precautions -On presentation patient was septic appearing and he was hypotensive, tachypneic, and elevated leukocytosis and required supplemental oxygen and was placed on a nonrebreather and transitioned to 15 Liters HFNC but will be transitioned back to NRB for Transport to [facility] -Question if the patient has aspirated or not and repeat chest x-ray today showed "Cardiac pacer noted stable position.  Stable cardiomegaly. Diffuse bilateral pulmonary infiltrates, right side greater than left. Diffuse right lung has progressed from prior exam. Low lung volumes, particularly on the right." -Initially patient was on a nonrebreather but has been weaned -SpO2: 100 % O2 Flow Rate (L/min): 15 L/min FiO2 (%): 100 % -Urinalysis showed consistency with a urinary tract infection  as urine had turbid appearance, large bilirubin, brown color, large hemoglobin, 15 ketones, moderate leukocytes, positive nitrites, many bacteria, greater than 50 RBCs per high-power field, and 21-50 WBCs -Blood cultures x2 and urine culture still pending -Initiated broad-spectrum antibiotics with IV vancomycin, IV cefepime, and IV Flagyl and will continue this for now until further cultures have been delineated -Obtain sputum culture as well -Patient was fluid resuscitated in the emergency room was given 30 cc/kg and I give him an additional 500 mL bolus and have placed him  on 125 mL's per hour but will be reducing the rate to 75 mL/h once he is actually admitted -Will consider Stress dose Steroids but completed Steroids at [facility] at last admission -Continue to monitor patient's clinical response to intervention and follow-up cultures and studies -Continue to monitor temperature curve -We will make the patient n.p.o. 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 Charlynne Cousins -Had to be placed on a nonrebreather when he first came to the hospital and was transitioned to 15 L high flow nasal cannula but will be transitioned back to NRB for transport to [place] -Given Combivent scheduled -Continuous pulse oximetry and maintain O2 saturation greater than 90% -Continue supplemental oxygen via nonrebreather and high flow nasal cannula and wean O2 as tolerated -Repeat Chest X-Ray  in a.m.  Acute Encephalopathy likely in setting of sepsis and infection -Continue to treat infection as above we will continue broad-spectrum antibiotics a -Continue with IV hydration -CT Scan Head showed "Atrophy with extensive supratentorial small vessel disease. Prior focal infarct  involving a portion of the anterior limb of the left external capsule. Probable prior infarct at the gray-white junction of the mid left frontal lobe. No acute appearing infarct is evident. No mass or hemorrhage. There are foci of arterial vascular calcification. Areas of paranasal sinus disease, most notably in the sphenoid sinus regions with air-fluid levels in the sphenoid sinus regions bilaterally." -Check TSH -Was improved slightly since coming into the hospital  Paroxysmal Atrial Fibrillation Hx of Complete Heart Block s/p PPM insertion  -We will hold his metoprolol currently there is hypotension and hold his apixaban because we will make him n.p.o. and change him to a heparin drip in the interim -Continue stepdown monitoring  Hyponatremia -Patient sodium was 132 -We will give IV fluid resuscitation as above -Continue to Monitor and Trend -Repeat CMP in AM   Hyperglycemia in the Setting of Diabetes Mellitus Type 2  -Status post steroids as a treatment for Covid infection which likely exacerbated his Hyperglycemia -Hold Metformin 500 mg po BID -Could be hyperglycemic in the setting of Steroid Demargination -Continue to monitor and trend and if necessary will need to place on sensitive NovoLog/scale insulin  Elevated BUN -IN setting of steroid demargination from recent Covid infection and is trending down from prior -The patient's BUN at discharge was 35 and today is 32 -Continue monitor and trend and evaluate for upper GI bleeding -Repeat CMP in a.m.  Sacral wound, present on admission -At least a stage II-III sacral decubitus ulcer  -Was a little erythematous and warm none-he is on antibiotic coverage as above -Continue wound care and will consult [place] -C/w Santyl   Elevated AST  -Patient's AST admission was 64 and is improving from last admission -Continue to monitor and trend and repeat CMP in a.m.  Leukocytosis -WBC has markedly worsened since 14 December when it  is 10.7 is now 20.9 -Could be related history is ray received for COVID-19 disease but likely is in the setting of sepsis from night aspiration pneumonia and UTI likely  -Continue to monitor for signs and symptoms of infection; continue with empiric antibiotics as above despite his lactic acid level being 1.5 and his procalcitonin level being 0.28 -Repeat CBC in AM  Thrombocytopenia -Patient's Platelet Count was 114,000 today and likely in the setting of Sepsis -Continue to monitor for signs and symptoms of bleeding as patient is anticoagulated; currently no overt bleeding noted -Repeat CBC in the a.m.  HLD -Hold Atorvastatin until able to safely swallow   GOC, DNR, poA  DVT prophylaxis: Anticoagulated with Heparin gtt Code Status: DO NOT RESUSCITATE  Family Communication: No family present at bedside  Disposition Plan: Pending further clinical Improvement  Consults called: None Admission status: Transfer to [facility]   Severity of Illness: The appropriate patient status for this patient is INPATIENT. Inpatient status is judged to be reasonable and necessary in order to provide the required intensity of service to ensure the patient's safety. The patient's presenting symptoms, physical exam findings, and initial radiographic and laboratory data in the context of their chronic comorbidities is felt to place them at high risk for further clinical deterioration. Furthermore, it is not anticipated that the patient will be medically stable for discharge from the hospital within 2 midnights of admission. The following factors support the patient status of inpatient.   " The patient's presenting symptoms include shortness of breath, confusion, buttock pain. " The worrisome physical exam findings include sacral ulcer. " The initial radiographic and laboratory data are worrisome because of worsening pneumonia and UTI. " The chronic co-morbidities are listed as above  * I  certify that at the point of admission it is my clinical judgment that the patient will require inpatient hospital care spanning beyond 2 midnights from the point of admission due to high intensity of service, high risk for further deterioration and high frequency of surveillance required.Kerney Elbe, D.O. [facility] PAGER is on [place]  If 7PM-7AM, please contact night-coverage [email or web address]  10/11/2019, 11:43 AM

## 2019-10-11 NOTE — ED Notes (Signed)
[facility] has been called for patient transport. [facility] request patient be switched to non-rebreather for transport to [facility]. MD Alfredia Ferguson was made aware and agreed to patient being changed to non-rebreather for transport to other facility. No ETA given for arrival by [facility].

## 2019-10-11 NOTE — Evaluation (Signed)
Clinical/Bedside Swallow Evaluation Patient Details  Name: Robert Cuevas MRN: [id number] Date of Birth: 11-12-1929  Today's Date: 10/11/2019 Time: SLP Start Time (ACUTE ONLY): [id number] SLP Stop Time (ACUTE ONLY): 1531 SLP Time Calculation (min) (ACUTE ONLY): 17 min  Past Medical History:  Past Medical History:  Diagnosis Date  . Cancer ([place])    skin cancer removed from head  . Complete heart block ([place])    a. s/p gen change 07/2011. b. Pacer pocket infx 02/2013 - s/p extraction, temp perm placement, then eventual implantation of new St. Jude pacemaker 03/04/13.  . Coronary artery calcification    Seen on CT 02/2013.  . High cholesterol   . HOH (hard of hearing)   . Hyperglycemia   . Hypertension   . Pacemaker- St Jude  03/06/2013   Extracted 5/14 Reimplant 5/14   . Paroxysmal atrial fibrillation ([place]) 01/04/2015   Past Surgical History:  Past Surgical History:  Procedure Laterality Date  . GENERATOR REMOVAL N/A 03/02/2013   Procedure: GENERATOR REMOVAL;  Surgeon: Evans Lance, MD;  Location: [facility];  Service: Cardiovascular;  Laterality: N/A;  . ICD LEAD REMOVAL N/A 03/02/2013   Procedure: ICD LEAD REMOVAL;  Surgeon: Evans Lance, MD;  Location: [facility] [facility];  Service: Cardiovascular;  Laterality: N/A;  . INGUINAL HERNIA REPAIR Bilateral   . PACEMAKER GENERATOR CHANGE N/A 09/19/2011   Procedure: PACEMAKER GENERATOR CHANGE;  Surgeon: Sanda Klein, MD;  Location: [facility] CATH LAB;  Service: Cardiovascular;  Laterality: N/A;  . PACEMAKER INSERTION    . PACEMAKER LEAD REMOVAL N/A 03/02/2013   Procedure: PACEMAKER LEAD REMOVAL;  Surgeon: Evans Lance, MD;  Location: [facility];  Service: Cardiovascular;  Laterality: N/A;  . PERMANENT PACEMAKER INSERTION N/A 03/04/2013   Procedure: PERMANENT PACEMAKER INSERTION;  Surgeon: Evans Lance, MD;  Location: [facility] CATH LAB;  Service: Cardiovascular;  Laterality: N/A;   HPI:  Pt is an 85 yo male who was recently admitted to [facility] for COVID-19 (12/6-12/19), who  presents with increasing SOB and AMS. There is concern for aspiration PNA and UTI; CXR showed bilateral infiltrates R > L. CT Head without acute concern for acute infarct. PMH includes: PAF, HTN, HOH, complete heart block, skin ca   Assessment / Plan / Recommendation Clinical Impression  Pt did not have his dentures available for this evaluation and does not masticate even soft solids without them, spitting out the still solid graham cracker. His oropharyngeal swallow appears to be functional with purees and thin liquids without his dentition. No overt s/s of aspiration are noted across consecutive straw sips. He denies any swallowing difficulty or esophageal symptoms. If there is concern for aspiration, it is possible that he had episodic difficulty in the setting of SOB or AMS. SLP provided education about the importance of sitting upright, taking small bites/sips, and using rest breaks for his respiratory status. He prefers to start Dys 1 diet and thin liquids with these precautions pending accessibility of his dentures.   SLP Visit Diagnosis: Dysphagia, oral phase (R13.11)    Aspiration Risk  Mild aspiration risk    Diet Recommendation Dysphagia 1 (Puree);Thin liquid   Liquid Administration via: Cup;Straw Medication Administration: Whole meds with puree Supervision: Patient able to self feed;Intermittent supervision to cue for compensatory strategies Compensations: Slow rate;Small sips/bites;Other (Comment)(frequent breaks for respiratory status) Postural Changes: Seated upright at 90 degrees    Other  Recommendations Oral Care Recommendations: Oral care BID   Follow up Recommendations (tba)  Frequency and Duration min 2x/week  2 weeks       Prognosis Prognosis for Safe Diet Advancement: Good      Swallow Study   General HPI: Pt is an 85 yo male who was recently admitted to [facility] for COVID-19 (12/6-12/19), who presents with increasing SOB and AMS. There is concern for  aspiration PNA and UTI; CXR showed bilateral infiltrates R > L. CT Head without acute concern for acute infarct. PMH includes: PAF, HTN, HOH, complete heart block, skin ca Type of Study: Bedside Swallow Evaluation Previous Swallow Assessment: none in chart Diet Prior to this Study: NPO Temperature Spikes Noted: No Respiratory Status: Nasal cannula History of Recent Intubation: No Behavior/Cognition: Alert;Cooperative;Other (Comment)(HOH) Oral Cavity Assessment: Within Functional Limits Oral Care Completed by SLP: No Oral Cavity - Dentition: Edentulous;Dentures, not available Vision: Functional for self-feeding Self-Feeding Abilities: Able to feed self Patient Positioning: Upright in bed Baseline Vocal Quality: Normal Volitional Swallow: Able to elicit    Oral/Motor/Sensory Function Overall Oral Motor/Sensory Function: Within functional limits   Ice Chips Ice chips: Not tested   Thin Liquid Thin Liquid: Within functional limits Presentation: Cup;Self Fed;Straw    Nectar Thick Nectar Thick Liquid: Not tested   Honey Thick Honey Thick Liquid: Not tested   Puree Puree: Within functional limits Presentation: Self Fed;Spoon   Solid     Solid: Impaired Presentation: Self Fed Oral Phase Impairments: Impaired mastication      Venita Sheffield Nalayah Hitt 10/11/2019,3:33 PM  Pollyann Glen, M.A. [place] Acute Environmental education officer [phone] Office [phone]

## 2019-10-11 NOTE — ED Notes (Signed)
Used towel roll to turn pt on left side due to pressure injury and for comfort.

## 2019-10-11 NOTE — Progress Notes (Signed)
ANTICOAGULATION CONSULT NOTE - Consult  Pharmacy Consult for IV heparin Indication: atrial fibrillation  No Known Allergies  Patient Measurements: Height: 5\' 8"  (172.7 cm) Weight: 140 lb (63.5 kg) IBW/kg (Calculated) : 68.4 Heparin Dosing Weight:   Vital Signs: Temp: 100 F (37.8 C) (12/22 0732) Temp Source: Rectal (12/22 0732) BP: 94/54 (12/22 0815) Pulse Rate: 70 (12/22 0815)  Labs: Recent Labs    10/11/19 0623  HGB 17.0  HCT 50.8  PLT 114*  APTT 34  LABPROT 17.1*  INR 1.4*  CREATININE 0.77  TROPONINIHS 8    Estimated Creatinine Clearance: 58.4 mL/min (by C-G formula based on SCr of 0.77 mg/dL).   Medical History: Past Medical History:  Diagnosis Date  . Cancer ([place])    skin cancer removed from head  . Complete heart block ([place])    a. s/p gen change 07/2011. b. Pacer pocket infx 02/2013 - s/p extraction, temp perm placement, then eventual implantation of new St. Jude pacemaker 03/04/13.  . Coronary artery calcification    Seen on CT 02/2013.  . High cholesterol   . HOH (hard of hearing)   . Hyperglycemia   . Hypertension   . Pacemaker- St Jude  03/06/2013   Extracted 5/14 Reimplant 5/14   . Paroxysmal atrial fibrillation ([place]) 01/04/2015    Medications:  Scheduled:    Assessment: Pharmacy is consulted to dose heparin in 85 yo male with PMH of atrilal fibrillation. Pt in on apixaban 5 mg PO BID PTA, with last dose being on 12/21 at 2100 per med rec. Pt was recently COVID-66 positive with a [facility] admission from 12/6 to 10/08/2019.  Today, 10/11/19  CBC WNL  INR 1.4, PT 17.1, aPTT 34  SCr 0.77 mg/dl  Baseline HL ordered, not yet drawn    Goal of Therapy:  Heparin level 0.3-0.7 units/ml Monitor platelets by anticoagulation protocol: Yes   Plan:   No bolus due recent apixaban aministration  Start heparin 950 units/hr  Obtain HL and aPTT 8 hours after start of drip  Daily CBC while on heparin  Monitor for signs and symptoms of  bleeding    Royetta Asal, PharmD, BCPS Pager [phone] 10/11/2019 10:25 AM

## 2019-10-11 NOTE — Progress Notes (Signed)
[facility] for IV heparin Indication: atrial fibrillation  No Known Allergies  Patient Measurements: Height: 5\' 8"  (172.7 cm) Weight: 137 lb (62.1 kg) IBW/kg (Calculated) : 68.4 Heparin Dosing Weight:   Vital Signs: Temp: 97.8 F (36.6 C) (12/22 1900) Temp Source: Oral (12/22 1900) BP: 85/42 (12/22 1900) Pulse Rate: 75 (12/22 1900)  Labs: Recent Labs    10/11/19 0623 10/11/19 1955  HGB 17.0  --   HCT 50.8  --   PLT 114*  --   APTT 34 94*  LABPROT 17.1*  --   INR 1.4*  --   HEPARINUNFRC  --  1.04*  CREATININE 0.77  --   TROPONINIHS 8  --     Estimated Creatinine Clearance: 57.1 mL/min (by C-G formula based on SCr of 0.77 mg/dL).   Assessment: Pharmacy is consulted to dose heparin in 85 yo male with PMH of atrilal fibrillation. Pt in on apixaban 5 mg PO BID PTA, with last dose being on 12/21 at 2100 per med rec.  Heparin level 1.04 (still being affected by apixaban) so utilizing PTT to monitor heparin until labs correlating. PTT 94 sec (therapeutic) on gtt at 950 units/hr. H/H ok on admission. Plt 114 (noted baseline past month ~110).  Goal of Therapy:  Heparin level 0.3-0.7 units/ml Monitor platelets by anticoagulation protocol: Yes   Plan:   Continue heparin 950 units/hr  Daily CBC, heparin level, and PTT while on heparin  Monitor for signs and symptoms of bleeding    Sherlon Handing, PharmD, BCPS Please see amion for complete clinical pharmacist phone list 10/11/2019 10:21 PM

## 2019-10-12 ENCOUNTER — Inpatient Hospital Stay (HOSPITAL_COMMUNITY): Payer: No Typology Code available for payment source

## 2019-10-12 DIAGNOSIS — J9621 Acute and chronic respiratory failure with hypoxia: Secondary | ICD-10-CM

## 2019-10-12 DIAGNOSIS — N390 Urinary tract infection, site not specified: Secondary | ICD-10-CM | POA: Diagnosis present

## 2019-10-12 LAB — COMPREHENSIVE METABOLIC PANEL
ALT: 46 U/L — ABNORMAL HIGH (ref 0–44)
AST: 29 U/L (ref 15–41)
Albumin: 2.7 g/dL — ABNORMAL LOW (ref 3.5–5.0)
Alkaline Phosphatase: 124 U/L (ref 38–126)
Anion gap: 7 (ref 5–15)
BUN: 20 mg/dL (ref 8–23)
CO2: 23 mmol/L (ref 22–32)
Calcium: 8.2 mg/dL — ABNORMAL LOW (ref 8.9–10.3)
Chloride: 106 mmol/L (ref 98–111)
Creatinine, Ser: 0.57 mg/dL — ABNORMAL LOW (ref 0.61–1.24)
GFR calc Af Amer: >60 mL/min (ref >60)
GFR calc non Af Amer: >60 mL/min (ref >60)
Glucose, Bld: 123 mg/dL — ABNORMAL HIGH (ref 70–99)
Potassium: 3.6 mmol/L (ref 3.5–5.1)
Sodium: 136 mmol/L (ref 135–145)
Total Bilirubin: 1.8 mg/dL — ABNORMAL HIGH (ref 0.3–1.2)
Total Protein: 5.2 g/dL — ABNORMAL LOW (ref 6.5–8.1)

## 2019-10-12 LAB — CBC WITH DIFFERENTIAL/PLATELET
Abs Immature Granulocytes: 0.12 10*3/uL — ABNORMAL HIGH (ref 0.00–0.07)
Basophils Absolute: 0.1 10*3/uL (ref 0.0–0.1)
Basophils Relative: 0 %
Eosinophils Absolute: 0.2 10*3/uL (ref 0.0–0.5)
Eosinophils Relative: 1 %
HCT: 46.3 % (ref 39.0–52.0)
Hemoglobin: 15.3 g/dL (ref 13.0–17.0)
Immature Granulocytes: 1 %
Lymphocytes Relative: 4 %
Lymphs Abs: 0.7 10*3/uL (ref 0.7–4.0)
MCH: 31.7 pg (ref 26.0–34.0)
MCHC: 33 g/dL (ref 30.0–36.0)
MCV: 95.9 fL (ref 80.0–100.0)
Monocytes Absolute: 0.9 10*3/uL (ref 0.1–1.0)
Monocytes Relative: 6 %
Neutro Abs: 13.5 10*3/uL — ABNORMAL HIGH (ref 1.7–7.7)
Neutrophils Relative %: 88 %
Platelets: 87 10*3/uL — ABNORMAL LOW (ref 150–400)
RBC: 4.83 MIL/uL (ref 4.22–5.81)
RDW: 14.7 % (ref 11.5–15.5)
WBC: 15.4 10*3/uL — ABNORMAL HIGH (ref 4.0–10.5)
nRBC: 0 % (ref 0.0–0.2)

## 2019-10-12 LAB — PROCALCITONIN: Procalcitonin: 0.17 ng/mL

## 2019-10-12 LAB — PHOSPHORUS: Phosphorus: 2.9 mg/dL (ref 2.5–4.6)

## 2019-10-12 LAB — MAGNESIUM: Magnesium: 1.6 mg/dL — ABNORMAL LOW (ref 1.7–2.4)

## 2019-10-12 LAB — APTT: aPTT: 94 seconds — ABNORMAL HIGH (ref 24–36)

## 2019-10-12 LAB — HEPARIN LEVEL (UNFRACTIONATED): Heparin Unfractionated: 0.86 IU/mL — ABNORMAL HIGH (ref 0.30–0.70)

## 2019-10-12 MED ORDER — ATORVASTATIN CALCIUM 10 MG PO TABS
20.0000 mg | ORAL_TABLET | Freq: Every day | ORAL | Status: DC
  Administered 2019-10-12 – 2019-10-16 (×5): 20 mg via ORAL
  Filled 2019-10-12 (×5): qty 2

## 2019-10-12 MED ORDER — APIXABAN 5 MG PO TABS
5.0000 mg | ORAL_TABLET | Freq: Two times a day (BID) | ORAL | Status: DC
  Administered 2019-10-12 – 2019-10-16 (×9): 5 mg via ORAL
  Filled 2019-10-12 (×9): qty 1

## 2019-10-12 MED ORDER — APIXABAN 5 MG PO TABS: 5.0000 mg | ORAL_TABLET | Freq: Two times a day (BID) | ORAL | Status: DC

## 2019-10-12 MED ORDER — MAGNESIUM SULFATE 4 GM/100ML IV SOLN
4.0000 g | Freq: Once | INTRAVENOUS | Status: AC
  Administered 2019-10-12: 4 g via INTRAVENOUS
  Filled 2019-10-12: qty 100

## 2019-10-12 NOTE — Progress Notes (Signed)
  Speech Language Pathology Treatment: Dysphagia  Patient Details Name: Robert Cuevas MRN: [id number] DOB: 10-26-1929 Today's Date: 10/12/2019 Time: [id number] SLP Time Calculation (min) (ACUTE ONLY): 13 min  Assessment / Plan / Recommendation Clinical Impression  SLP provided skilled observation and set-up assist during breakfast meal. He requested assistance for feeding, but after set-up was able to feed himself without assist from SLP. Education was reinforced about precautions, but no cueing was needed during eating and drinking for safety. Would continue Dys 1 diet and thin liquids. Pt still prefers to have purees until he can get back to his dentures.    HPI HPI: Pt is an 85 yo male who was recently admitted to [facility] for COVID-19 (12/6-12/19), who presents with increasing SOB and AMS. There is concern for aspiration PNA and UTI; CXR showed bilateral infiltrates R > L. CT Head without acute concern for acute infarct. PMH includes: PAF, HTN, HOH, complete heart block, skin ca      SLP Plan  Continue with current plan of care       Recommendations  Diet recommendations: Dysphagia 1 (puree);Thin liquid Liquids provided via: Cup;Straw Medication Administration: Whole meds with puree Supervision: Patient able to self feed;Intermittent supervision to cue for compensatory strategies Compensations: Slow rate;Small sips/bites;Other (Comment)(frequent breaks for respiratory status) Postural Changes and/or Swallow Maneuvers: Seated upright 90 degrees;Upright 30-60 min after meal                Oral Care Recommendations: Oral care BID Follow up Recommendations: (tba) SLP Visit Diagnosis: Dysphagia, oral phase (R13.11) Plan: Continue with current plan of care       GO                10/12/2019, 10:38 AM  Osie Bond., M.A. [place] Acute Environmental education officer [phone] Office [phone]

## 2019-10-12 NOTE — Progress Notes (Signed)
PROGRESS NOTE  Robert Cuevas BCW:[id number] DOB: 1930/10/08 DOA: 10/11/2019 [facility]CP[facility]  HPI/Recap of past 70 hours: 85 year old male with past medical history of complete heart block status post pacemaker placement, paroxysmal atrial fibrillation on chronic Eliquis, diabetes mellitus type 2 and recent discharge from [facility] for COVID-19 pneumonia on 12/19 after a 2-week hospital stay, treated with IV Remdisivir and Decadron.  Patient was discharged on 1 L nasal cannula to skilled nursing.  He was brought back to the emergency room on 12/22 with shortness of breath and found to have severe sepsis and hypoxia from aspiration pneumonia.  Patient also noted to have a large urinary tract infection.  This morning, patient is more awake and alert.  Feels like breathing is better.  Seen by speech therapy and placed on dysphagia 3 diet with thin liquids.  Assessment/Plan: Principal Problem:   Severe sepsis ([place]) secondary to aspiration pneumonia (from dysphagia) causing acute respiratory failure with hypoxia: No evidence of recurrence of Covid.  Keeping in [facility] given he was just recently discharged.  Continue oxygen working on weaning down.  Patient is more awake and alert following improvement in sepsis.  Seen by speech therapy and placed on dysphagia 3 diet with thin liquids.  We will restart his home medications.  Continue IV antibiotics.  Patient met criteria for severe sepsis on admission given tachypnea, leukocytosis with white blood cell count of 23.9 (was 10.7 a week prior) and organ dysfunction including hypotension with systolic blood pressure in the 80s and hypoxia initially requiring nonrebreather mask and 15 L high flow nasal cannula.  Hypoxia would not be attributed to aspiration pneumonia alone given significant improvement overnight with aggressive treatment of sepsis (currently down to 4 L nasal cannula).  White blood cell count down to 15 today. Active  Problems:   Complete heart block (HCC) status post pacemaker: Stable.    Paroxysmal atrial fibrillation (HCC) on Eliquis: We will resume Eliquis and stop heparin drip.    COVID-19 virus infection: Because patient was recently discharged, will keep in [facility].    Pressure injury of skin   Diabetes mellitus type 2, uncontrolled, with complications [facility]): Sliding scale only.    Hypotension: Secondary to sepsis, blood pressure now in the 100s.  Hold off on resuming home antihypertensives.  Urinary tract infection: Awaiting urine cultures.  Currently on broad-spectrum antibiotics which should cover this.  Code Status: DNR  Family Communication: Updated wife by phone  Disposition Plan: Back to skilled nursing once oxygenation further stabilized   Consultants:  None  Procedures:  None  Antimicrobials:  IV vancomycin and cefepime 12/22-present  DVT prophylaxis: Heparin being changed back to Eliquis   Objective: Vitals:   10/12/19 0400 10/12/19 0800  BP: (!) 103/52 (!) 109/45  Pulse: 70 70  Resp: (!) 22 18  Temp: 97.9 F (36.6 C) 98.8 F (37.1 C)  SpO2: 94% 94%    Intake/Output Summary (Last 24 hours) at 10/12/2019 1136 Last data filed at 10/12/2019 0606 Gross per 24 hour  Intake 2190.04 ml  Output 900 ml  Net 1290.04 ml   Filed Weights   10/11/19 0556 10/11/19 1555  Weight: 63.5 kg 62.1 kg   Body mass index is 20.83 kg/m.  Exam:   General: Alert and oriented x2, no acute distress  HEENT: Normocephalic and atraumatic, mucous membranes slightly dry  Neck: Supple, no JVD  Cardiovascular: Regular rate and rhythm, occasional ectopic beat  Respiratory: Coarse breath sounds bibasilar  Abdomen: Soft, nontender, nondistended, positive bowel sounds  Musculoskeletal: No clubbing or cyanosis, trace pitting edema  Neuro: No focal deficits  Psychiatry: Appropriate, no evidence of psychoses   Data Reviewed: CBC: Recent Labs  Lab  10/11/19 0623 10/12/19 0330  WBC 23.9* 15.4*  NEUTROABS 21.3* 13.5*  HGB 17.0 15.3  HCT 50.8 46.3  MCV 94.8 95.9  PLT 114* 87*   Basic Metabolic Panel: Recent Labs  Lab 10/07/19 0344 10/08/19 0254 10/11/19 0623 10/12/19 0330  NA 135 136 132* 136  K 4.7 4.6 4.6 3.6  CL 99 102 100 106  CO2 _0 GLUCOSE 130* 140* 173* 123*  BUN 35* 35* 32* 20  CREATININE 0.58* 0.50* 0.77 0.57*  CALCIUM 8.2* 8.8* 8.6* 8.2*  MG  --   --   --  1.6*  PHOS  --   --   --  2.9   GFR: Estimated Creatinine Clearance: 57.1 mL/min (A) (by C-G formula based on SCr of 0.57 mg/dL (L)). Liver Function Tests: Recent Labs  Lab 10/07/19 0344 10/11/19 0623 10/12/19 0330  AST 96* 41 29  ALT 117* 64* 46*  ALKPHOS 138* 126 124  BILITOT 2.1* 3.2* 1.8*  PROT 5.5* 5.6* 5.2*  ALBUMIN 2.9* 3.1* 2.7*   No results for input(s): LIPASE, AMYLASE in the last 168 hours. No results for input(s): AMMONIA in the last 168 hours. Coagulation Profile: Recent Labs  Lab 10/11/19 0623  INR 1.4*   Cardiac Enzymes: Recent Labs  Lab 10/05/19 1350  CKTOTAL 20*   BNP (last 3 results) No results for input(s): PROBNP in the last 8760 hours. HbA1C: No results for input(s): HGBA1C in the last 72 hours. CBG: Recent Labs  Lab 10/08/19 0352 10/08/19 0707 10/08/19 1105 10/11/19 0634 10/11/19 1537  GLUCAP 155* 254* 252* 172* 129*   Lipid Profile: No results for input(s): CHOL, HDL, LDLCALC, TRIG, CHOLHDL, LDLDIRECT in the last 72 hours. Thyroid Function Tests: Recent Labs    10/11/19 1955  TSH 1.136   Anemia Panel: No results for input(s): VITAMINB12, FOLATE, FERRITIN, TIBC, IRON, RETICCTPCT in the last 72 hours. Urine analysis:    Component Value Date/Time   COLORURINE BROWN (A) 10/11/2019 0642   APPEARANCEUR TURBID (A) 10/11/2019 0642   LABSPEC 1.025 10/11/2019 0642   PHURINE 6.5 10/11/2019 0642   GLUCOSEU 100 (A) 10/11/2019 0642   HGBUR LARGE (A) 10/11/2019 0642   BILIRUBINUR LARGE (A)  10/11/2019 0642   KETONESUR 15 (A) 10/11/2019 0642   PROTEINUR >300 (A) 10/11/2019 0642   NITRITE POSITIVE (A) 10/11/2019 0642   LEUKOCYTESUR MODERATE (A) 10/11/2019 0642   Sepsis Labs: _1 (procalcitonin:4,lacticidven:4)  ) Recent Results (from the past 240 hour(s))  Blood Culture (routine x 2)     Status: None (Preliminary result)   Collection Time: 10/11/19  6:42 AM   Specimen: BLOOD  Result Value Ref Range Status   Specimen Description   Final    BLOOD LEFT ARM Performed at [facility], [place] [address], [place], [place] [address]    Special Requests   Final    BOTTLES DRAWN AEROBIC AND ANAEROBIC Blood Culture results may not be optimal due to an excessive volume of blood received in culture bottles Performed at [facility] [address], [place], [place] [address]    Culture   Final    NO GROWTH 1 DAY Performed at [facility], [place] [address] [place], [place] [address]    Report Status PENDING  Incomplete  Blood Culture (routine x 2)     Status: None (Preliminary result)   Collection Time: 10/11/19  6:42 AM   Specimen: BLOOD  Result Value Ref Range Status   Specimen Description   Final    BLOOD RIGHT ARM Performed at [facility] [address], [place], [place] [address]    Special Requests   Final    BOTTLES DRAWN AEROBIC AND ANAEROBIC Blood Culture adequate volume Performed at [facility] [address] [place], [place] [address]    Culture   Final    NO GROWTH 1 DAY Performed at [facility], [place] [address], [place], [place] [address]    Report Status PENDING  Incomplete  Urine culture     Status: Abnormal (Preliminary result)   Collection Time: 10/11/19  6:42 AM   Specimen: In/Out Cath Urine  Result Value Ref Range Status   Specimen Description   Final    IN/OUT CATH URINE Performed at [facility] [address] [place], [place]  [address]    Special Requests   Final    NONE Performed at [facility], [place] [address], [place], [place] [address]    Culture (A)  Final    >=100,000 COLONIES/mL PSEUDOMONAS AERUGINOSA >=100,000 COLONIES/mL ENTEROCOCCUS FAECALIS    Report Status PENDING  Incomplete      Studies: DG CHEST PORT 1 VIEW  Result Date: 10/12/2019 CLINICAL DATA:  Shortness of breath EXAM: PORTABLE CHEST 1 VIEW COMPARISON:  10/11/2019 FINDINGS: Right pacer remains in place, unchanged. Cardiomegaly. Diffuse bilateral airspace disease, right greater than left. No visible significant effusions. No acute bony abnormality. IMPRESSION: Stable diffuse bilateral airspace disease, right greater than left. Electronically Signed   By: Rolm Baptise M.D.   On: 10/12/2019 08:43    Scheduled Meds: . apixaban  5 mg Oral BID  . atorvastatin  20 mg Oral Daily  . collagenase  1 application Topical Daily  . Ipratropium-Albuterol  1 puff Inhalation Q6H    Continuous Infusions: . sodium chloride Stopped (10/11/19 1734)  . ceFEPime (MAXIPIME) IV 2 g (10/12/19 0420)  . magnesium sulfate bolus IVPB    . metronidazole 500 mg (10/12/19 0819)  . vancomycin 1,000 mg (10/12/19 0606)     LOS: 1 day     Annita Brod, MD [facility]  To reach me or the doctor on call, go to: [email or web address] Password [facility]  10/12/2019, 11:36 AM

## 2019-10-12 NOTE — Progress Notes (Addendum)
[place] [facility] for Apixaban Indication: atrial fibrillation  No Known Allergies  Patient Measurements: Height: 5\' 8"  (172.7 cm) Weight: 137 lb (62.1 kg) IBW/kg (Calculated) : 68.4   Vital Signs: Temp: 98.8 F (37.1 C) (12/23 0800) Temp Source: Oral (12/23 0800) BP: 109/45 (12/23 0800) Pulse Rate: 70 (12/23 0800)  Labs: Recent Labs    10/11/19 0623 10/11/19 1955 10/12/19 0330  HGB 17.0  --  15.3  HCT 50.8  --  46.3  PLT 114*  --  87*  APTT 34 94* 94*  LABPROT 17.1*  --   --   INR 1.4*  --   --   HEPARINUNFRC  --  1.04* 0.86*  CREATININE 0.77  --  0.57*  TROPONINIHS 8  --   --     Estimated Creatinine Clearance: 57.1 mL/min (A) (by C-G formula based on SCr of 0.57 mg/dL (L)).   Assessment: Pharmacy is consulted to dose heparin in 85 yo male with PMH of atrilal fibrillation. Pt in on apixaban 5 mg PO BID PTA, with last dose being on 12/21 at 2100 per med rec. Patient was started on IV heparin12/22 but will now be transitioned back to apixaban. Pharmacy has been consulted to dose apixaban.  H/H ok on admission. Plt are low at 87 (noted baseline past month ~110).   Goal of Therapy:  Monitor platelets by anticoagulation protocol: Yes   Plan:   Resume home apixaban dose of 5 mg PO BID   Monitor for signs and symptoms of bleeding  Monitor CBC periodically  Sherren Kerns, PharmD PGY1 [place] Resident Please see amion for complete clinical pharmacist phone list 10/12/2019 9:17 AM

## 2019-10-12 NOTE — TOC Initial Note (Signed)
Transition of Care [facility]) - Initial/Assessment Note    Patient Details  Name: Robert Cuevas MRN: [id number] Date of Birth: Oct 10, 1930  Transition of Care [facility]) CM/SW Contact:    Shade Flood, LCSW Phone Number: 10/12/2019, 9:46 AM  Clinical Narrative:                  Pt was admitted from [facility]. He was discharged to [facility] from [place] on 12/19. Pt sent to ED by [facility] for sats of 88% on 5L. Prior to his previous [place] admission, pt was at home with his wife. Anticipating pt will continue to need SNF at dc. He will need new insurance authorization prior to dc.  TOC will follow and assist with dc planning needs.  Expected Discharge Plan: Skilled Nursing Facility Barriers to Discharge: Continued Medical Work up   Patient Goals and CMS Choice        Expected Discharge Plan and Services Expected Discharge Plan: [place] In-house Referral: Clinical Social Work     Living arrangements for the past 2 months: [place], [place]                                      Prior Living Arrangements/Services Living arrangements for the past 2 months: [place], [place] Lives with:: Spouse Patient language and need for interpreter reviewed:: Yes        Need for Family Participation in Patient Care: Yes (Comment) Care giver support system in place?: Yes (comment)   Criminal Activity/Legal Involvement Pertinent to Current Situation/Hospitalization: No - Comment as needed  Activities of Daily Living Home Assistive Devices/Equipment: Built-in shower seat, Cane (specify quad or straight), Eyeglasses, Dentures (specify type), CBG Meter(upper/lower dentures, single point cane) ADL Screening (condition at time of admission) Patient's cognitive ability adequate to safely complete daily activities?: Yes Is the patient deaf or have difficulty hearing?: No Does the patient have difficulty seeing, even when  wearing glasses/contacts?: No Does the patient have difficulty concentrating, remembering, or making decisions?: No Patient able to express need for assistance with ADLs?: Yes Does the patient have difficulty dressing or bathing?: Yes Independently performs ADLs?: No Communication: Independent Dressing (OT): Needs assistance Is this a change from baseline?: Pre-admission baseline Grooming: Needs assistance Is this a change from baseline?: Pre-admission baseline Feeding: Needs assistance Is this a change from baseline?: Pre-admission baseline Bathing: Needs assistance Is this a change from baseline?: Pre-admission baseline Toileting: Needs assistance Is this a change from baseline?: Pre-admission baseline In/Out Bed: Needs assistance Is this a change from baseline?: Pre-admission baseline Walks in Home: Needs assistance Is this a change from baseline?: Pre-admission baseline Does the patient have difficulty walking or climbing stairs?: Yes(secondary to weakness) Weakness of Legs: Both Weakness of Arms/Hands: None  Permission Sought/Granted                  Emotional Assessment       Orientation: : Oriented to Self, Oriented to Place, Oriented to  Time Alcohol / Substance Use: Not Applicable Psych Involvement: No (comment)  Admission diagnosis:  SOB (shortness of breath) [R06.02] Acute urinary tract infection [N39.0] Acute respiratory failure with hypoxia ([place]) [J96.01] Septic shock (HCC) [A41.9, R65.21] Sepsis ([place]) [A41.9] Multifocal pneumonia [J18.9] Acute sepsis [facility]) [A41.9] Patient Active Problem List   Diagnosis Date Noted  . Sepsis ([place]) 10/11/2019  . Hypotension 10/03/2019  .  Diabetes mellitus type 2, uncontrolled, with complications ([place]) [id number]  . Acute on chronic respiratory failure with hypoxia ([place]) 09/25/2019  . COVID-19 virus infection 09/25/2019  . Diabetes mellitus type II, non insulin dependent ([place]) 09/25/2019  . Pneumonia due to COVID-19  virus 09/25/2019  . Hyperbilirubinemia 09/25/2019  . Pressure injury of skin 09/25/2019  . Long term (current) use of anticoagulants 02/23/2016  . Paroxysmal atrial fibrillation ([place]) 01/04/2015  . Dyspnea 05/03/2013  . Tachycardia 03/06/2013  . Hypoxia 03/06/2013  . Pacemaker- St Jude  03/06/2013  . Infection of pacemaker pocket ([place]) 02/22/2013  . Complete heart block ([place]) 09/19/2011   PCP:  [facility]:   [place], [place] [id number] [place] [id number] Phone: [phone] Fax: [phone]     Social Determinants of Health (SDOH) Interventions    Readmission Risk Interventions Readmission Risk Prevention Plan 10/02/2019  Medication Review (RN CM) Complete  Some recent data might be hidden

## 2019-10-12 NOTE — Plan of Care (Signed)
  Problem: Education: Goal: Knowledge of risk factors and measures for prevention of condition will improve Outcome: Progressing   Problem: Coping: Goal: Psychosocial and spiritual needs will be supported Outcome: Progressing   Problem: Respiratory: Goal: Will maintain a patent airway Outcome: Progressing Goal: Complications related to the disease process, condition or treatment will be avoided or minimized Outcome: Progressing   Problem: Activity: Goal: Ability to tolerate increased activity will improve Outcome: Progressing   Problem: Clinical Measurements: Goal: Ability to maintain a body temperature in the normal range will improve Outcome: Progressing   Problem: Respiratory: Goal: Ability to maintain adequate ventilation will improve Outcome: Progressing Goal: Ability to maintain a clear airway will improve Outcome: Progressing   Problem: Urinary Elimination: Goal: Signs and symptoms of infection will decrease Outcome: Progressing

## 2019-10-12 NOTE — Progress Notes (Signed)
Occupational Therapy Evaluation Patient Details Name: Robert Cuevas MRN: [id number] DOB: November 20, 1929 Today's Date: 10/12/2019    History of Present Illness 85 year old male with past medical history of complete heart block status post pacemaker placement, paroxysmal atrial fibrillation on chronic Eliquis, diabetes mellitus type 2 and recent discharge from [place] for COVID-19 pneumonia on 12/19 after a 2-week hospital stay, treated with IV Remdisivir and Decadron.  Patient was discharged on 1 L nasal cannula to skilled nursing.  He was brought back to the emergency room on 12/22 with shortness of breath and found to have severe sepsis and hypoxia from aspiration pneumonia.  Patient also noted to have a large urinary tract infection.   Clinical Impression   Unsure of accuracy of PLOF as pt appears confused, providing conflicting statements as compared to previous admission. Pt reports that he lives with his wife, independent in ADLs, mobility, and driving. Pt reports that he does not ambulate with an assistive device. Pt states that wife completes all IADLs and can assist him at home. Pt currently requires supervision to max assist for self-care and functional transfer tasks. Pt tolerated sitting edge of bed ~10 min with supervision, noting 0 instances of loss of balance. Pt's SpO2 decreased to 83% on room air while seated edge of bed. Unable to increase oxygen saturation on room air with 2.5L [place] donned and SpO2 increasing into low 90s. Pt stood x 1 with RW and min to mod assist x 1 for ~2 min to get cleaned up after episode of urinary incontinence. Pt able to transfer to bedside chair with min to mod two person hand held assist. Noted 0 instances of loss of balance, however pt unsteady on his feet. SpO2 decreased to low 80s with transfer, with pt requiring ~2-3 min seated rest break for oxygen to increase back to 94% on 2.5L [place]. 2/4 DOE. Pt demonstrates decreased cognition, ROM, strength,  endurance, balance, standing tolerance, activity tolerance, and safety awareness impacting ability to complete self-care and functional transfer tasks. Recommend skilled OT services to address above deficits in order to promote function and prevent further decline. Recommend SNF placement for additional rehab prior to discharge home.    Follow Up Recommendations  SNF;Supervision/Assistance - 24 hour    Equipment Recommendations  Other (comment)(TBD at next venue of care)    Recommendations for Other Services       Precautions / Restrictions Precautions Precautions: Fall Restrictions Weight Bearing Restrictions: No      Mobility Bed Mobility Overal bed mobility: Needs Assistance Bed Mobility: Supine to Sit     Supine to sit: Mod assist;HOB elevated     General bed mobility comments: Pt able to bring BLEs off bed requiring mod assist for trunk  Transfers Overall transfer level: Needs assistance Equipment used: 2 person hand held assist;Rolling walker (2 wheeled) Transfers: Sit to/from [facility] Sit to Stand: Mod assist;Min assist Stand pivot transfers: Min assist;Mod assist;+2 physical assistance(hand held assist)       General transfer comment: Pt stood x 1 with RW. Hand held assist for stand pivot to chair    Balance Overall balance assessment: Needs assistance   Sitting balance-Leahy Scale: Fair       Standing balance-Leahy Scale: Poor                             ADL either performed or assessed with clinical judgement   ADL Overall ADL's : Needs  assistance/impaired Eating/Feeding: Supervision/ safety;Set up;Sitting   Grooming: Supervision/safety;Set up;Sitting   Upper Body Bathing: Minimal assistance;Sitting;Moderate assistance   Lower Body Bathing: Maximal assistance;Sitting/lateral leans;Sit to/from stand   Upper Body Dressing : Minimal assistance;Sitting   Lower Body Dressing: Maximal assistance;Sit to/from  stand;Sitting/lateral leans   Toilet Transfer: Moderate assistance;Stand-pivot;+2 for physical assistance   Toileting- Clothing Manipulation and Hygiene: Maximal assistance;Sit to/from stand;Sitting/lateral lean       Functional mobility during ADLs: Moderate assistance;Rolling walker;+2 for physical assistance;Minimal assistance(stood x 1 with RW. HHA for stand pivot transfer to chair)       Vision Baseline Vision/History: Wears glasses Wears Glasses: Reading only       Perception     Praxis      Pertinent Vitals/Pain Pain Assessment: 0-10 Pain Score: 3  Pain Location: stomach Pain Descriptors / Indicators: Aching Pain Intervention(s): Monitored during session     Hand Dominance Right   Extremity/Trunk Assessment Upper Extremity Assessment Upper Extremity Assessment: LUE deficits/detail;Generalized weakness(RUE ROM WFL) LUE Deficits / Details: LUE shld flex ~20 degrees.   Lower Extremity Assessment Lower Extremity Assessment: Defer to PT evaluation       Communication Communication Communication: HOH(extremely HOH)   Cognition Arousal/Alertness: Awake/alert Behavior During Therapy: WFL for tasks assessed/performed Overall Cognitive Status: No family/caregiver present to determine baseline cognitive functioning                                 General Comments: Pt appears confused. Oriented to name and place.   General Comments  Pt on RA upon arrival with SpO2 at 90%. Once seated EOB, SpO2 decreased to 83% on RA with pt unable to bring up. 2.5L [place] donned with SpO2 increasing to low 90s. Pt transferred to chair with SpO2 decreasing to low 80s. SpO2 at 94% on 2.5L  at end of session.    Exercises     Shoulder Instructions      Home Living Family/patient expects to be discharged to:: Private residence Living Arrangements: Spouse/significant other Available Help at Discharge: Family Type of Home: House Home Access: Level entry     Home  Layout: One level     Bathroom Shower/Tub: Occupational psychologist: [place] - single point;Shower seat - built in          Prior Functioning/Environment Level of Independence: Independent        Comments: Unsure of accuracy of PLOF as pt appears confused. Varying reports compared to previous admission. Pt reports being independent in ADLs and mobility tasks. Pt reports ambulating without an assistive device. Pt states that wife completes all IADLs. Pt still drives. Pt reports 0 falls in the last 6 months.         OT Problem List: Decreased strength;Decreased range of motion;Decreased activity tolerance;Impaired balance (sitting and/or standing);Decreased safety awareness;Cardiopulmonary status limiting activity      OT Treatment/Interventions: Self-care/ADL training;Therapeutic exercise;Neuromuscular education;Energy conservation;DME and/or AE instruction;Therapeutic activities;Patient/family education;Balance training    OT Goals(Current goals can be found in the care plan section) Acute Rehab OT Goals Patient Stated Goal: Pt agreeable to sitting up in chair. Time For Goal Achievement: 10/26/19 Potential to Achieve Goals: Good ADL Goals Pt Will Perform Grooming: with min guard assist;standing Pt Will Perform Lower Body Bathing: with min assist;sit to/from stand;sitting/lateral leans Pt Will Perform Lower Body Dressing: with min assist;sitting/lateral leans;sit to/from stand Pt Will Transfer to Toilet:  with min assist;bedside commode Pt Will Perform Toileting - Clothing Manipulation and hygiene: with min assist;sitting/lateral leans;sit to/from stand Additional ADL Goal #1: Pt to tolerate standing 5 min with min guard, in preparation for ADLs.  OT Frequency: Min 2X/week   Barriers to D/C:            Co-evaluation              AM-PAC OT "6 Clicks" Daily Activity     Outcome Measure Help from another person eating meals?: A  Little Help from another person taking care of personal grooming?: A Little Help from another person toileting, which includes using toliet, bedpan, or urinal?: A Lot Help from another person bathing (including washing, rinsing, drying)?: A Lot Help from another person to put on and taking off regular upper body clothing?: A Little Help from another person to put on and taking off regular lower body clothing?: A Lot 6 Click Score: 15   End of Session Equipment Utilized During Treatment: Oxygen;Rolling walker Nurse Communication: Mobility status  Activity Tolerance: Patient limited by fatigue(Limited by SOB) Patient left: in chair;with call bell/phone within reach;with nursing/sitter in room  OT Visit Diagnosis: Unsteadiness on feet (R26.81);Muscle weakness (generalized) (M62.81)                Time: [id number] OT Time Calculation (min): 39 min Charges:  OT General Charges $OT Visit: 1 Visit OT Evaluation $OT Eval Moderate Complexity: 1 Mod OT Treatments $Self Care/Home Management : 8-22 mins $Therapeutic Activity: 8-22 mins  Mauri [place] OTR/L [phone]   Mauri [place] 10/12/2019, 3:20 PM

## 2019-10-13 LAB — MAGNESIUM: Magnesium: 1.8 mg/dL (ref 1.7–2.4)

## 2019-10-13 LAB — BASIC METABOLIC PANEL
Anion gap: 8 (ref 5–15)
BUN: 13 mg/dL (ref 8–23)
CO2: 24 mmol/L (ref 22–32)
Calcium: 8 mg/dL — ABNORMAL LOW (ref 8.9–10.3)
Chloride: 103 mmol/L (ref 98–111)
Creatinine, Ser: 0.48 mg/dL — ABNORMAL LOW (ref 0.61–1.24)
GFR calc Af Amer: >60 mL/min (ref >60)
GFR calc non Af Amer: >60 mL/min (ref >60)
Glucose, Bld: 144 mg/dL — ABNORMAL HIGH (ref 70–99)
Potassium: 3.2 mmol/L — ABNORMAL LOW (ref 3.5–5.1)
Sodium: 135 mmol/L (ref 135–145)

## 2019-10-13 LAB — CBC
HCT: 43 % (ref 39.0–52.0)
Hemoglobin: 14.4 g/dL (ref 13.0–17.0)
MCH: 31.6 pg (ref 26.0–34.0)
MCHC: 33.5 g/dL (ref 30.0–36.0)
MCV: 94.3 fL (ref 80.0–100.0)
Platelets: 102 10*3/uL — ABNORMAL LOW (ref 150–400)
RBC: 4.56 MIL/uL (ref 4.22–5.81)
RDW: 14.6 % (ref 11.5–15.5)
WBC: 10.1 10*3/uL (ref 4.0–10.5)
nRBC: 0 % (ref 0.0–0.2)

## 2019-10-13 LAB — GLUCOSE, CAPILLARY
Glucose-Capillary: 141 mg/dL — ABNORMAL HIGH (ref 70–99)
Glucose-Capillary: 143 mg/dL — ABNORMAL HIGH (ref 70–99)

## 2019-10-13 LAB — PROCALCITONIN: Procalcitonin: 0.14 ng/mL

## 2019-10-13 MED ORDER — POTASSIUM CHLORIDE CRYS ER 20 MEQ PO TBCR
40.0000 meq | EXTENDED_RELEASE_TABLET | Freq: Once | ORAL | Status: AC
  Administered 2019-10-13: 40 meq via ORAL
  Filled 2019-10-13: qty 2

## 2019-10-13 MED ORDER — RAMIPRIL 2.5 MG PO CAPS
2.5000 mg | ORAL_CAPSULE | Freq: Every day | ORAL | Status: DC
  Administered 2019-10-13 – 2019-10-16 (×4): 2.5 mg via ORAL
  Filled 2019-10-13 (×4): qty 1

## 2019-10-13 MED ORDER — LIP MEDEX EX OINT
TOPICAL_OINTMENT | CUTANEOUS | Status: DC | PRN
  Filled 2019-10-13: qty 7

## 2019-10-13 MED ORDER — METOPROLOL TARTRATE 25 MG PO TABS
50.0000 mg | ORAL_TABLET | Freq: Two times a day (BID) | ORAL | Status: DC
  Administered 2019-10-13 – 2019-10-16 (×6): 50 mg via ORAL
  Filled 2019-10-13 (×7): qty 2

## 2019-10-13 NOTE — Progress Notes (Signed)
PROGRESS NOTE  Robert Cuevas BUL:[id number] DOB: 01-07-1930 DOA: 10/11/2019 PCP: [facility]  HPI/Recap of past 61 hours: 85 year old male with past medical history of complete heart block status post pacemaker placement, paroxysmal atrial fibrillation on chronic Eliquis, diabetes mellitus type 2 and recent discharge from [place] for COVID-19 pneumonia on 12/19 after a 2-week hospital stay, treated with IV Remdisivir and Decadron.  Patient was discharged on 1 L nasal cannula to skilled nursing.  He was brought back to the emergency room on 12/22 with shortness of breath and found to have severe sepsis and hypoxia from aspiration pneumonia.  Patient also noted to have a large urinary tract infection which grew out Enterococcus and Pseudomonas. Seen by speech therapy and placed on dysphagia 3 diet with thin liquids.  Today, patient is doing okay, no complaints.  He is down to 3 L nasal cannula  Assessment/Plan: Principal Problem:   Severe sepsis ([place]) secondary to aspiration pneumonia (from dysphagia) causing acute respiratory failure with hypoxia: No evidence of recurrence of Covid.  Keeping in [facility] given he was just recently discharged.  Continue oxygen working on weaning down.  Patient is more awake and alert following improvement in sepsis.  Seen by speech therapy and placed on dysphagia 3 diet with thin liquids.  We will restart his home medications.  Continue IV antibiotics.  Patient met criteria for severe sepsis on admission given tachypnea, leukocytosis with white blood cell count of 23.9 (was 10.7 a week prior) and organ dysfunction including hypotension with systolic blood pressure in the 80s and hypoxia initially requiring nonrebreather mask and 15 L high flow nasal cannula.  Hypoxia would not be attributed to aspiration pneumonia alone given significant improvement overnight with aggressive treatment of sepsis (currently down to 3 L nasal cannula).  White  blood cell count down to 15 today. Active Problems:   Complete heart block (HCC) status post pacemaker: Stable.    Paroxysmal atrial fibrillation (HCC) on Eliquis: We will resume Eliquis and stop heparin drip.    COVID-19 virus infection: Because patient was recently discharged, will keep in [facility].    Pressure injury of skin   Diabetes mellitus type 2, uncontrolled, with complications [facility]): Sliding scale only.  CBG stable    Hypotension: Secondary to sepsis, blood pressure have since stabilized and now are trending upward.  Will decrease IV fluids and resume home medications.  Urinary tract infection: Urine growing out Pseudomonas and Enterococcus, sensitivities pending.  Currently on broad-spectrum antibiotics which should cover this.  Code Status: DNR  Family Communication: Updated wife by phone  Disposition Plan: Back to skilled nursing once off of oxygen   Consultants:  None  Procedures:  None  Antimicrobials:  IV vancomycin and cefepime 12/22-present  DVT prophylaxis: Heparin being changed back to Eliquis   Objective: Vitals:   10/13/19 1200 10/13/19 1252  BP:  (!) 167/69  Pulse: 75 77  Resp: (!) 26 (!) 21  Temp:  97.6 F (36.4 C)  SpO2: 92% 92%    Intake/Output Summary (Last 24 hours) at 10/13/2019 1347 Last data filed at 10/13/2019 1200 Gross per 24 hour  Intake 1160 ml  Output 1000 ml  Net 160 ml   Filed Weights   10/11/19 0556 10/11/19 1555  Weight: 63.5 kg 62.1 kg   Body mass index is 20.83 kg/m.  Exam:   General: Alert and oriented x2, no acute distress  HEENT: Normocephalic and atraumatic, mucous membranes slightly dry  Neck: Supple,  no JVD  Cardiovascular: Regular rate and rhythm, occasional ectopic beat  Respiratory: Decreased breath sounds bibasilar  Abdomen: Soft, nontender, nondistended, positive bowel sounds  Musculoskeletal: No clubbing or cyanosis, trace pitting edema  Neuro: No focal  deficits  Psychiatry: Appropriate, no evidence of psychoses   Data Reviewed: CBC: Recent Labs  Lab 10/11/19 0623 10/12/19 0330 10/13/19 0330  WBC 23.9* 15.4* 10.1  NEUTROABS 21.3* 13.5*  --   HGB 17.0 15.3 14.4  HCT 50.8 46.3 43.0  MCV 94.8 95.9 94.3  PLT 114* 87* 301*   Basic Metabolic Panel: Recent Labs  Lab 10/07/19 0344 10/08/19 0254 10/11/19 0623 10/12/19 0330 10/13/19 0330  NA 135 136 132* 136 135  K 4.7 4.6 4.6 3.6 3.2*  CL 99 102 100 106 103  CO2 '27 25 23 23 24  '$ GLUCOSE 130* 140* 173* 123* 144*  BUN 35* 35* 32* 20 13  CREATININE 0.58* 0.50* 0.77 0.57* 0.48*  CALCIUM 8.2* 8.8* 8.6* 8.2* 8.0*  MG  --   --   --  1.6* 1.8  PHOS  --   --   --  2.9  --    GFR: Estimated Creatinine Clearance: 57.1 mL/min (A) (by C-G formula based on SCr of 0.48 mg/dL (L)). Liver Function Tests: Recent Labs  Lab 10/07/19 0344 10/11/19 0623 10/12/19 0330  AST 96* 41 29  ALT 117* 64* 46*  ALKPHOS 138* 126 124  BILITOT 2.1* 3.2* 1.8*  PROT 5.5* 5.6* 5.2*  ALBUMIN 2.9* 3.1* 2.7*   No results for input(s): LIPASE, AMYLASE in the last 168 hours. No results for input(s): AMMONIA in the last 168 hours. Coagulation Profile: Recent Labs  Lab 10/11/19 0623  INR 1.4*   Cardiac Enzymes: No results for input(s): CKTOTAL, CKMB, CKMBINDEX, TROPONINI in the last 168 hours. BNP (last 3 results) No results for input(s): PROBNP in the last 8760 hours. HbA1C: No results for input(s): HGBA1C in the last 72 hours. CBG: Recent Labs  Lab 10/08/19 1105 10/11/19 0634 10/11/19 1537 10/13/19 0854 10/13/19 1152  GLUCAP 252* 172* 129* 141* 143*   Lipid Profile: No results for input(s): CHOL, HDL, LDLCALC, TRIG, CHOLHDL, LDLDIRECT in the last 72 hours. Thyroid Function Tests: Recent Labs    10/11/19 1955  TSH 1.136   Anemia Panel: No results for input(s): VITAMINB12, FOLATE, FERRITIN, TIBC, IRON, RETICCTPCT in the last 72 hours. Urine analysis:    Component Value Date/Time    COLORURINE BROWN (A) 10/11/2019 0642   APPEARANCEUR TURBID (A) 10/11/2019 0642   LABSPEC 1.025 10/11/2019 0642   PHURINE 6.5 10/11/2019 0642   GLUCOSEU 100 (A) 10/11/2019 0642   HGBUR LARGE (A) 10/11/2019 0642   BILIRUBINUR LARGE (A) 10/11/2019 0642   KETONESUR 15 (A) 10/11/2019 0642   PROTEINUR >300 (A) 10/11/2019 0642   NITRITE POSITIVE (A) 10/11/2019 0642   LEUKOCYTESUR MODERATE (A) 10/11/2019 0642   Sepsis Labs: '@LABRCNTIP'$ (procalcitonin:4,lacticidven:4)  ) Recent Results (from the past 240 hour(s))  Blood Culture (routine x 2)     Status: None (Preliminary result)   Collection Time: 10/11/19  6:42 AM   Specimen: BLOOD  Result Value Ref Range Status   Specimen Description   Final    BLOOD LEFT ARM Performed at [facility], [place] [address] [place], [place] [address]    Special Requests   Final    BOTTLES DRAWN AEROBIC AND ANAEROBIC Blood Culture results may not be optimal due to an excessive volume of blood received in culture bottles Performed at  [facility], [place] [address], [place], [place] [address]    Culture   Final    NO GROWTH 2 DAYS Performed at [place] [address] [place], [place] [address]    Report Status PENDING  Incomplete  Blood Culture (routine x 2)     Status: None (Preliminary result)   Collection Time: 10/11/19  6:42 AM   Specimen: BLOOD  Result Value Ref Range Status   Specimen Description   Final    BLOOD RIGHT ARM Performed at [place] [address] [place], [place] [address]    Special Requests   Final    BOTTLES DRAWN AEROBIC AND ANAEROBIC Blood Culture adequate volume Performed at [place] [address], [place], [place] [address]    Culture   Final    NO GROWTH 2 DAYS Performed at [place] [address] [place], [place] [address]    Report Status PENDING  Incomplete  Urine culture     Status: Abnormal (Preliminary result)    Collection Time: 10/11/19  6:42 AM   Specimen: In/Out Cath Urine  Result Value Ref Range Status   Specimen Description   Final    IN/OUT CATH URINE Performed at [place] [address] [place], [place] [address]    Special Requests   Final    NONE Performed at [facility], [place] [address] [place], [place] [address]    Culture (A)  Final    >=100,000 COLONIES/mL PSEUDOMONAS AERUGINOSA >=100,000 COLONIES/mL ENTEROCOCCUS FAECALIS SUSCEPTIBILITIES TO FOLLOW Performed at [facility], [place] [address], [place],  [address]    Report Status PENDING  Incomplete      Studies: No results found.  Scheduled Meds: . apixaban  5 mg Oral BID  . atorvastatin  20 mg Oral Daily  . collagenase  1 application Topical Daily  . Ipratropium-Albuterol  1 puff Inhalation Q6H    Continuous Infusions: . sodium chloride 100 mL/hr at 10/12/19 1725  . ceFEPime (MAXIPIME) IV 2 g (10/13/19 0618)  . metronidazole 500 mg (10/13/19 0858)  . vancomycin 1,000 mg (10/13/19 0521)     LOS: 2 days     Annita Brod, MD [facility]  To reach me or the doctor on call, go to: [email or web address] Password [email or web address]  10/13/2019, 1:47 PM

## 2019-10-13 NOTE — Progress Notes (Signed)
Called patient's wife with an update, we discussed his plan. She was also able to talk to him.

## 2019-10-13 NOTE — Plan of Care (Signed)
  Problem: Education: Goal: Knowledge of risk factors and measures for prevention of condition will improve Outcome: Progressing   Problem: Coping: Goal: Psychosocial and spiritual needs will be supported Outcome: Progressing   Problem: Respiratory: Goal: Will maintain a patent airway Outcome: Progressing Goal: Complications related to the disease process, condition or treatment will be avoided or minimized Outcome: Progressing   Problem: Activity: Goal: Ability to tolerate increased activity will improve Outcome: Progressing   Problem: Clinical Measurements: Goal: Ability to maintain a body temperature in the normal range will improve Outcome: Progressing   Problem: Respiratory: Goal: Ability to maintain adequate ventilation will improve Outcome: Progressing Goal: Ability to maintain a clear airway will improve Outcome: Progressing   Problem: Urinary Elimination: Goal: Signs and symptoms of infection will decrease Outcome: Progressing

## 2019-10-13 NOTE — Evaluation (Signed)
Physical Therapy Evaluation Patient Details Name: HANFORD TSUNG MRN: [id number] DOB: 1929/12/07 Today's Date: 10/13/2019   History of Present Illness  85 year old male with past medical history of complete heart block status post pacemaker placement, paroxysmal atrial fibrillation on chronic Eliquis, diabetes mellitus type 2 and recent discharge from [facility] for COVID-19 pneumonia on 12/19 after a 2-week hospital stay, treated with IV Remdisivir and Decadron.  Patient was discharged on 1 L nasal cannula to skilled nursing.  He was brought back to the emergency room on 12/22 with shortness of breath and found to have severe sepsis and hypoxia from aspiration pneumonia.  Patient also noted to have a large urinary tract infection.  Clinical Impression   Pt re-admitted with above diagnosis. Prior to initial hospitalization pt states was living independently at home with spouse and their one dog. He was since admitted to this hospital and d/c to SNF and returned to hospital approx 3 days after dc. Pt currently with functional limitations due to the deficits listed below (see PT Problem List). Pt is actually doing much better with mobility than at initial admission, he is needing minimal assistance with bed mobility, moderate assist with sit<>stand with RW, pt is able to stand approx 1 min at a time prior to requesting to sit down, was able to stand pivot to and from commode with min a and cues. Pt was on 3L/min via [place] and was noted to desat into low 80s with standing and transfer activities. Pt did well with recovery and when cued for pursed lip breathing.  Pt will benefit from skilled PT to increase their independence and safety with mobility to allow discharge to the venue listed below. Pt will require skilled post acute care level rehab prior to going home with spouse.      Follow Up Recommendations SNF    Equipment Recommendations  None recommended by PT    Recommendations for Other  Services OT consult     Precautions / Restrictions Precautions Precautions: Fall;ICD/Pacemaker Restrictions Weight Bearing Restrictions: No      Mobility  Bed Mobility Overal bed mobility: Needs Assistance Bed Mobility: Supine to Sit;Sit to Supine     Supine to sit: Min assist Sit to supine: Min assist      Transfers Overall transfer level: Needs assistance Equipment used: Rolling walker (2 wheeled) Transfers: Sit to/from [facility] Sit to Stand: Mod assist Stand pivot transfers: Min assist          Ambulation/Gait Ambulation/Gait assistance: Mod assist Gait Distance (Feet): 5 Feet Assistive device: Rolling walker (2 wheeled) Gait Pattern/deviations: Shuffle;Wide base of support        Stairs            Wheelchair Mobility    Modified Rankin (Stroke Patients Only)       Balance Overall balance assessment: Needs assistance Sitting-balance support: Feet supported Sitting balance-Leahy Scale: Fair     Standing balance support: Bilateral upper extremity supported Standing balance-Leahy Scale: Fair                               Pertinent Vitals/Pain Pain Assessment: No/denies pain    Home Living Family/patient expects to be discharged to:: Skilled nursing facility Living Arrangements: Spouse/significant other Available Help at Discharge: Family Type of Home: House Home Access: Level entry     Home Layout: One level Home Equipment: Cane - single point;Shower seat - built in Additional  Comments: reports spouse also had COVID but that she awas only sick for 2 days and has been fine since    Prior Function Level of Independence: Independent         Comments: prior  to initial hospitalization pt states was independent at home with spouse and their black poodle     Hand Dominance   Dominant Hand: Right    Extremity/Trunk Assessment   Upper Extremity Assessment Upper Extremity Assessment: Defer to OT  evaluation    Lower Extremity Assessment Lower Extremity Assessment: Generalized weakness    Cervical / Trunk Assessment Cervical / Trunk Assessment: Kyphotic  Communication   Communication: HOH  Cognition Arousal/Alertness: Awake/alert Behavior During Therapy: WFL for tasks assessed/performed Overall Cognitive Status: No family/caregiver present to determine baseline cognitive functioning                                        General Comments      Exercises     Assessment/Plan    PT Assessment Patient needs continued PT services  PT Problem List Decreased strength;Decreased activity tolerance;Decreased balance;Decreased mobility;Cardiopulmonary status limiting activity;Decreased knowledge of use of DME       PT Treatment Interventions DME instruction;Gait training;Stair training;Functional mobility training;Therapeutic activities;Therapeutic exercise;Balance training;Patient/family education    PT Goals (Current goals can be found in the Care Plan section)  Acute Rehab PT Goals Patient Stated Goal: pt anxious about getting better and going home to his wife and dog Time For Goal Achievement: 10/27/19 Potential to Achieve Goals: Fair    Frequency Min 2X/week   Barriers to discharge        Co-evaluation               AM-PAC PT "6 Clicks" Mobility  Outcome Measure Help needed turning from your back to your side while in a flat bed without using bedrails?: A Little Help needed moving from lying on your back to sitting on the side of a flat bed without using bedrails?: A Little Help needed moving to and from a bed to a chair (including a wheelchair)?: A Little Help needed standing up from a chair using your arms (e.g., wheelchair or bedside chair)?: A Lot Help needed to walk in hospital room?: A Lot Help needed climbing 3-5 steps with a railing? : Total 6 Click Score: 14    End of Session Equipment Utilized During Treatment: Oxygen Activity  Tolerance: Patient limited by fatigue;Patient limited by lethargy;Treatment limited secondary to medical complications (Comment) Patient left: in bed;with call bell/phone within reach Nurse Communication: Mobility status PT Visit Diagnosis: Other abnormalities of gait and mobility (R26.89);Muscle weakness (generalized) (M62.81)    Time: [id number] PT Time Calculation (min) (ACUTE ONLY): 28 min   Charges:   PT Evaluation $PT Eval Moderate Complexity: 1 Mod PT Treatments $Therapeutic Activity: 8-22 mins        Horald Chestnut, PT   Delford Field 10/13/2019, 12:55 PM

## 2019-10-14 LAB — CBC
HCT: 47.3 % (ref 39.0–52.0)
Hemoglobin: 15.9 g/dL (ref 13.0–17.0)
MCH: 31.7 pg (ref 26.0–34.0)
MCHC: 33.6 g/dL (ref 30.0–36.0)
MCV: 94.2 fL (ref 80.0–100.0)
Platelets: 108 10*3/uL — ABNORMAL LOW (ref 150–400)
RBC: 5.02 MIL/uL (ref 4.22–5.81)
RDW: 15 % (ref 11.5–15.5)
WBC: 8.1 10*3/uL (ref 4.0–10.5)
nRBC: 0 % (ref 0.0–0.2)

## 2019-10-14 LAB — BASIC METABOLIC PANEL
Anion gap: 11 (ref 5–15)
BUN: 12 mg/dL (ref 8–23)
CO2: 23 mmol/L (ref 22–32)
Calcium: 8.2 mg/dL — ABNORMAL LOW (ref 8.9–10.3)
Chloride: 101 mmol/L (ref 98–111)
Creatinine, Ser: 0.56 mg/dL — ABNORMAL LOW (ref 0.61–1.24)
GFR calc Af Amer: >60 mL/min (ref >60)
GFR calc non Af Amer: >60 mL/min (ref >60)
Glucose, Bld: 111 mg/dL — ABNORMAL HIGH (ref 70–99)
Potassium: 4.5 mmol/L (ref 3.5–5.1)
Sodium: 135 mmol/L (ref 135–145)

## 2019-10-14 LAB — URINE CULTURE: Culture: >=100000 — AB

## 2019-10-14 MED ORDER — PIPERACILLIN-TAZOBACTAM 3.375 G IVPB
3.3750 g | Freq: Three times a day (TID) | INTRAVENOUS | Status: DC
  Administered 2019-10-14 – 2019-10-16 (×6): 3.375 g via INTRAVENOUS
  Filled 2019-10-14 (×8): qty 50

## 2019-10-14 MED ORDER — MORPHINE SULFATE (PF) 2 MG/ML IV SOLN
2.0000 mg | Freq: Once | INTRAVENOUS | Status: AC
  Administered 2019-10-14: 2 mg via INTRAVENOUS
  Filled 2019-10-14: qty 1

## 2019-10-14 NOTE — Progress Notes (Addendum)
Pharmacy Antibiotic Note  Addendum: Discussed with Dr. [place] Pink - will de-escalate antibiotics to just Zosyn to treat UTI with pseudomonas/enterococcus since blood cultures are negative.  Will plan for 5 days of therapy.  Plan: Zosyn 3.375gm IV q8hr x 5 days. Watch/monitor renal function.  Rober Minion, PharmD., MS Clinical Pharmacist   Robert Cuevas is a 85 y.o. male admitted on 10/11/2019 with sepsis.  Pharmacy has been consulted for vancomycin and cefepime dosing.  Pt recently admitted to [facility] from 12/6 to 12/19 for COVID-19 treatment.   12/25 - Renal function remains stable with creatinine of 0.56 and an estimated crcl ~ 79ml/min.  Urine culture positive for Enterococcus and Pseudomonas which is sensitive to IV Cefepime and Vancomycin.  He remains afebrile and WBC is within normal limits on 12/24.  Plan:  Continue Cefepime and Vancomycin as ordered.  Will need to determine length of therapy (0000000 days for complicated UTI).    Metronidazole 500 mg IV q8h   Monitor clinical course, renal function, cultures as available   Height: 5\' 8"  (172.7 cm) Weight: 137 lb (62.1 kg) IBW/kg (Calculated) : 68.4  Temp (24hrs), Avg:98.1 F (36.7 C), Min:97.6 F (36.4 C), Max:98.4 F (36.9 C)  Recent Labs  Lab 10/08/19 0254 10/11/19 0623 10/12/19 0330 10/13/19 0330 10/14/19 0615  WBC  --  23.9* 15.4* 10.1  --   CREATININE 0.50* 0.77 0.57* 0.48* 0.56*  LATICACIDVEN  --  1.5  --   --   --     Estimated Creatinine Clearance: 57.1 mL/min (A) (by C-G formula based on SCr of 0.56 mg/dL (L)).    No Known Allergies  Antimicrobials this admission: 12/22 vancomycin >>  12/22 cefepime >>  12/22 metronidazole >>   Dose adjustments this admission:  Microbiology results: 12/22 BCx: - NGTD 12/22 Urine - Enterococcus and Pseudomonas  Thank you for allowing pharmacy to be a part of this patient's care.  Rober Minion, PharmD., MS Clinical Pharmacist Pager:  [phone] Thank you  for allowing pharmacy to be part of this patients care team. 10/14/2019 9:44 AM

## 2019-10-14 NOTE — Progress Notes (Signed)
PROGRESS NOTE  Robert Cuevas:[id number] DOB: 07/28/1930 DOA: 10/11/2019 PCP: [facility]  HPI/Recap of past 84 hours: 85 year old male with past medical history of complete heart block status post pacemaker placement, paroxysmal atrial fibrillation on chronic Eliquis, diabetes mellitus type 2 and recent discharge from [facility] for COVID-19 pneumonia on 12/19 after a 2-week hospital stay, treated with IV Remdisivir and Decadron.  Patient was discharged on 1 L nasal cannula to skilled nursing.  He was brought back to the emergency room on 12/22 with shortness of breath and found to have severe sepsis and hypoxia from aspiration pneumonia.  Patient also noted to have a large urinary tract infection which grew out Enterococcus and Pseudomonas.  Near pansensitive. Seen by speech therapy and placed on dysphagia 3 diet with thin liquids.  Today, patient is doing okay, a little tired.  Assessment/Plan: Principal Problem:   Severe sepsis ([place]) secondary to aspiration pneumonia (from dysphagia) causing acute respiratory failure with hypoxia: No evidence of recurrence of Covid.  Keeping in [facility] given he was just recently discharged.  Continue oxygen working on weaning down.  Patient is more awake and alert following improvement in sepsis.  Seen by speech therapy and placed on dysphagia 3 diet with thin liquids.  We will restart his home medications.  Continue IV antibiotics.  Patient met criteria for severe sepsis on admission given tachypnea, leukocytosis with white blood cell count of 23.9 (was 10.7 a week prior) and organ dysfunction including hypotension with systolic blood pressure in the 80s and hypoxia initially requiring nonrebreather mask and 15 L high flow nasal cannula.  Hypoxia would not be attributed to aspiration pneumonia alone given significant improvement overnight with aggressive treatment of sepsis (currently down to 3 L nasal cannula).  White blood cell  count down to 15 today. Active Problems:   Complete heart block (HCC) status post pacemaker: Stable.    Paroxysmal atrial fibrillation (HCC) on Eliquis: We will resume Eliquis and stop heparin drip.    COVID-19 virus infection: Because patient was recently discharged, will keep in [facility].    Pressure injury of skin   Diabetes mellitus type 2, uncontrolled, with complications [facility]): Sliding scale only.  CBG stable    Hypotension: Secondary to sepsis, blood pressure have since stabilized and now are trending upward.  Will decrease IV fluids and resume home medications.  Urinary tract infection: Urine growing out Pseudomonas and Enterococcus, near pansensitive.  Discussed with pharmacy and will narrow her antibiotic coverage to just Zosyn  Code Status: DNR  Family Communication: Updated wife by phone  Disposition Plan: Back to skilled nursing in the next few days, hopefully would like to wean down his oxygen further   Consultants:  None  Procedures:  None  Antimicrobials:  IV vancomycin and cefepime 12/22-12/25  IV Zosyn 12/25-present   DVT prophylaxis: Heparin being changed back to Eliquis   Objective: Vitals:   10/14/19 0822 10/14/19 1200  BP: 136/63 138/65  Pulse: 70 74  Resp: (!) 22 19  Temp: (!) 97.5 F (36.4 C) 98 F (36.7 C)  SpO2: 95% 97%    Intake/Output Summary (Last 24 hours) at 10/14/2019 1412 Last data filed at 10/14/2019 0518 Gross per 24 hour  Intake 1178.45 ml  Output 1400 ml  Net -221.55 ml   Filed Weights   10/11/19 0556 10/11/19 1555  Weight: 63.5 kg 62.1 kg   Body mass index is 20.83 kg/m.  Exam:   General: Alert and oriented  x2, no acute distress  HEENT: Normocephalic and atraumatic, mucous membranes slightly dry  Neck: Supple, no JVD  Cardiovascular: Regular rate and rhythm, occasional ectopic beat  Respiratory: Decreased breath sounds bibasilar  Abdomen: Soft, nontender, nondistended, positive bowel  sounds  Musculoskeletal: No clubbing or cyanosis, trace pitting edema  Neuro: No focal deficits  Psychiatry: Appropriate, no evidence of psychoses   Data Reviewed: CBC: Recent Labs  Lab 10/11/19 0623 10/12/19 0330 10/13/19 0330  WBC 23.9* 15.4* 10.1  NEUTROABS 21.3* 13.5*  --   HGB 17.0 15.3 14.4  HCT 50.8 46.3 43.0  MCV 94.8 95.9 94.3  PLT 114* 87* 503*   Basic Metabolic Panel: Recent Labs  Lab 10/08/19 0254 10/11/19 0623 10/12/19 0330 10/13/19 0330 10/14/19 0615  NA 136 132* 136 135 135  K 4.6 4.6 3.6 3.2* 4.5  CL 102 100 106 103 101  CO2 '25 23 23 24 23  '$ GLUCOSE 140* 173* 123* 144* 111*  BUN 35* 32* '20 13 12  '$ CREATININE 0.50* 0.77 0.57* 0.48* 0.56*  CALCIUM 8.8* 8.6* 8.2* 8.0* 8.2*  MG  --   --  1.6* 1.8  --   PHOS  --   --  2.9  --   --    GFR: Estimated Creatinine Clearance: 57.1 mL/min (A) (by C-G formula based on SCr of 0.56 mg/dL (L)). Liver Function Tests: Recent Labs  Lab 10/11/19 0623 10/12/19 0330  AST 41 29  ALT 64* 46*  ALKPHOS 126 124  BILITOT 3.2* 1.8*  PROT 5.6* 5.2*  ALBUMIN 3.1* 2.7*   No results for input(s): LIPASE, AMYLASE in the last 168 hours. No results for input(s): AMMONIA in the last 168 hours. Coagulation Profile: Recent Labs  Lab 10/11/19 0623  INR 1.4*   Cardiac Enzymes: No results for input(s): CKTOTAL, CKMB, CKMBINDEX, TROPONINI in the last 168 hours. BNP (last 3 results) No results for input(s): PROBNP in the last 8760 hours. HbA1C: No results for input(s): HGBA1C in the last 72 hours. CBG: Recent Labs  Lab 10/08/19 1105 10/11/19 0634 10/11/19 1537 10/13/19 0854 10/13/19 1152  GLUCAP 252* 172* 129* 141* 143*   Lipid Profile: No results for input(s): CHOL, HDL, LDLCALC, TRIG, CHOLHDL, LDLDIRECT in the last 72 hours. Thyroid Function Tests: Recent Labs    10/11/19 1955  TSH 1.136   Anemia Panel: No results for input(s): VITAMINB12, FOLATE, FERRITIN, TIBC, IRON, RETICCTPCT in the last 72  hours. Urine analysis:    Component Value Date/Time   COLORURINE BROWN (A) 10/11/2019 0642   APPEARANCEUR TURBID (A) 10/11/2019 0642   LABSPEC 1.025 10/11/2019 0642   PHURINE 6.5 10/11/2019 0642   GLUCOSEU 100 (A) 10/11/2019 0642   HGBUR LARGE (A) 10/11/2019 0642   BILIRUBINUR LARGE (A) 10/11/2019 0642   KETONESUR 15 (A) 10/11/2019 0642   PROTEINUR >300 (A) 10/11/2019 0642   NITRITE POSITIVE (A) 10/11/2019 0642   LEUKOCYTESUR MODERATE (A) 10/11/2019 0642   Sepsis Labs: '@LABRCNTIP'$ (procalcitonin:4,lacticidven:4)  ) Recent Results (from the past 240 hour(s))  Blood Culture (routine x 2)     Status: None (Preliminary result)   Collection Time: 10/11/19  6:42 AM   Specimen: BLOOD  Result Value Ref Range Status   Specimen Description   Final    BLOOD LEFT ARM Performed at [facility], [place] [address] [place], [place] [address]    Special Requests   Final    BOTTLES DRAWN AEROBIC AND ANAEROBIC Blood Culture results may not be optimal due to an excessive volume  of blood received in culture bottles Performed at [facility], [place] [address] [place], [place] [address]    Culture   Final    NO GROWTH 3 DAYS Performed at [facility], [place] [address] [place], [place] [address]    Report Status PENDING  Incomplete  Blood Culture (routine x 2)     Status: None (Preliminary result)   Collection Time: 10/11/19  6:42 AM   Specimen: BLOOD  Result Value Ref Range Status   Specimen Description   Final    BLOOD RIGHT ARM Performed at [facility] [address], [place], [place] [address]    Special Requests   Final    BOTTLES DRAWN AEROBIC AND ANAEROBIC Blood Culture adequate volume Performed at [facility] [address] [place], [place] [address]    Culture   Final    NO GROWTH 3 DAYS Performed at [facility], [place] [address] [place], [place] [address]    Report Status PENDING  Incomplete   Urine culture     Status: Abnormal   Collection Time: 10/11/19  6:42 AM   Specimen: In/Out Cath Urine  Result Value Ref Range Status   Specimen Description   Final    IN/OUT CATH URINE Performed at [facility] [address], [place], [place] [address]    Special Requests   Final    NONE Performed at [facility], [place] [address], [place], [place] [address]    Culture (A)  Final    >=100,000 COLONIES/mL PSEUDOMONAS AERUGINOSA >=100,000 COLONIES/mL ENTEROCOCCUS FAECALIS    Report Status 10/14/2019 FINAL  Final   Organism ID, Bacteria PSEUDOMONAS AERUGINOSA (A)  Final   Organism ID, Bacteria ENTEROCOCCUS FAECALIS (A)  Final      Susceptibility   Enterococcus faecalis - MIC*    AMPICILLIN <=2 SENSITIVE Sensitive     NITROFURANTOIN <=16 SENSITIVE Sensitive     VANCOMYCIN 1 SENSITIVE Sensitive     * >=100,000 COLONIES/mL ENTEROCOCCUS FAECALIS   Pseudomonas aeruginosa - MIC*    CEFTAZIDIME 4 SENSITIVE Sensitive     CIPROFLOXACIN <=0.25 SENSITIVE Sensitive     GENTAMICIN <=1 SENSITIVE Sensitive     IMIPENEM 1 SENSITIVE Sensitive     PIP/TAZO 8 SENSITIVE Sensitive     CEFEPIME 2 SENSITIVE Sensitive     * >=100,000 COLONIES/mL PSEUDOMONAS AERUGINOSA      Studies: No results found.  Scheduled Meds: . apixaban  5 mg Oral BID  . atorvastatin  20 mg Oral Daily  . collagenase  1 application Topical Daily  . Ipratropium-Albuterol  1 puff Inhalation Q6H  . metoprolol tartrate  50 mg Oral BID  . ramipril  2.5 mg Oral Daily    Continuous Infusions: . sodium chloride 10 mL/hr at 10/13/19 1419  . ceFEPime (MAXIPIME) IV 200 mL/hr at 10/14/19 0518  . metronidazole 500 mg (10/14/19 0828)  . vancomycin 1,000 mg (10/14/19 0518)     LOS: 3 days     Annita Brod, MD [facility]  To reach me or the doctor on call, go to: [email or web address] Password [email or web address]  10/14/2019, 2:12 PM

## 2019-10-15 DIAGNOSIS — U071 COVID-19: Secondary | ICD-10-CM

## 2019-10-15 DIAGNOSIS — R1310 Dysphagia, unspecified: Secondary | ICD-10-CM

## 2019-10-15 LAB — PROCALCITONIN: Procalcitonin: <0.1 ng/mL

## 2019-10-15 LAB — CBC
HCT: 45.8 % (ref 39.0–52.0)
Hemoglobin: 15.4 g/dL (ref 13.0–17.0)
MCH: 31.6 pg (ref 26.0–34.0)
MCHC: 33.6 g/dL (ref 30.0–36.0)
MCV: 93.9 fL (ref 80.0–100.0)
Platelets: 127 10*3/uL — ABNORMAL LOW (ref 150–400)
RBC: 4.88 MIL/uL (ref 4.22–5.81)
RDW: 14.9 % (ref 11.5–15.5)
WBC: 6.4 10*3/uL (ref 4.0–10.5)
nRBC: 0 % (ref 0.0–0.2)

## 2019-10-15 LAB — BASIC METABOLIC PANEL WITH GFR
Anion gap: 9 (ref 5–15)
BUN: 11 mg/dL (ref 8–23)
CO2: 25 mmol/L (ref 22–32)
Calcium: 8.2 mg/dL — ABNORMAL LOW (ref 8.9–10.3)
Chloride: 102 mmol/L (ref 98–111)
Creatinine, Ser: 0.59 mg/dL — ABNORMAL LOW (ref 0.61–1.24)
GFR calc Af Amer: >60 mL/min
GFR calc non Af Amer: >60 mL/min
Glucose, Bld: 103 mg/dL — ABNORMAL HIGH (ref 70–99)
Potassium: 4.4 mmol/L (ref 3.5–5.1)
Sodium: 136 mmol/L (ref 135–145)

## 2019-10-15 LAB — GLUCOSE, CAPILLARY: Glucose-Capillary: 121 mg/dL — ABNORMAL HIGH (ref 70–99)

## 2019-10-15 LAB — BRAIN NATRIURETIC PEPTIDE: B Natriuretic Peptide: 274 pg/mL — ABNORMAL HIGH (ref 0.0–100.0)

## 2019-10-15 MED ORDER — ACETAMINOPHEN 325 MG PO TABS: 650.0000 mg | ORAL_TABLET | Freq: Four times a day (QID) | ORAL | Status: DC | PRN

## 2019-10-15 NOTE — TOC Progression Note (Signed)
Transition of Care [facility]) - Progression Note    Patient Details  Name: Robert Cuevas MRN: [id number] Date of Birth: 17-Sep-1930  Transition of Care [facility]) CM/SW Contact  Shade Flood, LCSW Phone Number: 10/15/2019, 2:30 PM  Clinical Narrative:     TOC following. Per MD, pt will likely be stable for dc tomorrow. Spoke with pt's wife by phone and plan remains for pt to return to [facility] for rehab at [facility]. TOC spoke with Irine Seal at [facility] this afternoon and they can take pt back tomorrow. [place] and requested SNF auth. Received return call and auth for 4 days starting tomorrow was approved. Auth number [id number]. Debbe Odea and MD.  Donella Stade will follow up tomorrow.  Expected Discharge Plan: [place] Barriers to Discharge: Continued Medical Work up  Expected Discharge Plan and Services Expected Discharge Plan: [place] In-house Referral: Clinical Social Work     Living arrangements for the past 2 months: [place], Single Family Home                                       Social Determinants of Health (SDOH) Interventions    Readmission Risk Interventions Readmission Risk Prevention Plan 10/02/2019  Medication Review (RN CM) Complete  Some recent data might be hidden

## 2019-10-15 NOTE — NC FL2 (Signed)
[place] LEVEL OF CARE SCREENING TOOL     IDENTIFICATION  Patient Name: Robert Cuevas Birthdate: 09/04/1930 Sex: male Admission Date (Current Location): 10/11/2019  [facility] and [place] Number:  Herbalist and Address:  [facility], [place] [address] [place], [place] [address]      Provider Number: [id number]  Attending Physician Name and Address:  Annita Brod, MD  Relative Name and Phone Number:       Current Level of Care: Hospital Recommended Level of Care: [place] Prior Approval Number:    Date Approved/Denied:   PASRR Number: [id number]  Discharge Plan: SNF    Current Diagnoses: Patient Active Problem List   Diagnosis Date Noted  . Acute lower UTI 10/12/2019  . Severe sepsis ([place]) 10/11/2019  . Hypotension 10/03/2019  . Diabetes mellitus type 2, uncontrolled, with complications ([place]) [id number]  . Acute on chronic respiratory failure with hypoxia ([place]) 09/25/2019  . COVID-19 virus infection 09/25/2019  . Diabetes mellitus type II, non insulin dependent ([place]) 09/25/2019  . Pneumonia due to COVID-19 virus 09/25/2019  . Hyperbilirubinemia 09/25/2019  . Pressure injury of skin 09/25/2019  . Long term (current) use of anticoagulants 02/23/2016  . Paroxysmal atrial fibrillation ([name]) 01/04/2015  . Dysphagia 05/03/2013  . Tachycardia 03/06/2013  . Aspiration pneumonia ([place]) 03/06/2013  . Pacemaker- St Jude  03/06/2013  . 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 Complete heart block ([place]) 09/19/2011    Orientation RESPIRATION BLADDER Height & Weight     Self, Time, Place  O2(see dc summary) Continent Weight: 137 lb (62.1 kg) Height:  5\' 8"  (172.7 cm)  BEHAVIORAL SYMPTOMS/MOOD NEUROLOGICAL BOWEL NUTRITION STATUS      Continent Diet(see dc summary)  AMBULATORY STATUS COMMUNICATION OF NEEDS Skin   Extensive Assist Verbally PU Stage and Appropriate Care(Mid [place])                    Personal Care Assistance Level of Assistance    Bathing Assistance: Limited assistance Feeding assistance: Independent Dressing Assistance: Limited assistance     Functional Limitations Info    Sight Info: Adequate Hearing Info: Impaired Speech Info: Adequate    SPECIAL CARE FACTORS FREQUENCY  PT (By licensed PT), OT (By licensed OT)     PT Frequency: 5 times weekly OT Frequency: 3 times week            Contractures Contractures Info: Not present    Additional Factors Info  Code Status Code Status Info: DNR Allergies Info: NKA           Current Medications (10/15/2019):  This is the current hospital active medication list Current Facility-Administered Medications  Medication Dose Route Frequency Provider Last Rate Last Admin  . 0.9 %  sodium chloride infusion   Intravenous Continuous Annita Brod, MD 10 mL/hr at 10/15/19 0400 Rate Verify at 10/15/19 0400  . acetaminophen (TYLENOL) tablet 650 mg  650 mg Oral Q6H PRN Raiford Noble [place], DO   650 mg at 10/14/19 1038   Or  . acetaminophen (TYLENOL) suppository 650 mg  650 mg Rectal Q6H PRN Raiford Noble Latif, DO      . apixaban (ELIQUIS) tablet 5 mg  5 mg Oral BID Sherren Kerns D, RPH   5 mg at 10/15/19 1031  . atorvastatin (LIPITOR) tablet 20 mg  20 mg Oral Daily Annita Brod, MD   20 mg at 10/15/19 1030  . bisacodyl (DULCOLAX)  suppository 10 mg  10 mg Rectal Daily PRN Raiford Noble Latif, DO      . collagenase (SANTYL) ointment 1 application  1 application Topical Daily Raiford Noble [place], [place]   1 application at [id number]  . Ipratropium-Albuterol (COMBIVENT) respimat 1 puff  1 puff Inhalation Q6H Sheikh, Omair [place], DO   1 puff at 10/15/19 1407  . lip balm (CARMEX) ointment   Topical PRN Annita Brod, MD      . metoprolol tartrate (LOPRESSOR) tablet 50 mg  50 mg Oral BID Annita Brod, MD   50 mg at 10/15/19 1031  . metroNIDAZOLE (FLAGYL) IVPB 500 mg  500 mg Intravenous Q8H  Sheikh, Omair [place], DO 100 mL/hr at 10/15/19 1030 500 mg at 10/15/19 1030  . ondansetron (ZOFRAN) tablet 4 mg  4 mg Oral Q6H PRN Raiford Noble [place], DO   4 mg at 10/15/19 1031   Or  . ondansetron [facility]) injection 4 mg  4 mg Intravenous Q6H PRN Raiford Noble Latif, DO      . piperacillin-tazobactam (ZOSYN) IVPB 3.375 g  3.375 g Intravenous Q8H Rober Minion F, RPH 12.5 mL/hr at 10/15/19 0623 3.375 g at 10/15/19 [id number]  . polyvinyl alcohol (LIQUIFILM TEARS) 1.4 % ophthalmic solution 1 drop  1 drop Both Eyes PRN Sheikh, Omair Latif, DO      . ramipril (ALTACE) capsule 2.5 mg  2.5 mg Oral Daily Annita Brod, MD   2.5 mg at 10/15/19 1031     Discharge Medications: Please see discharge summary for a list of discharge medications.  Relevant Imaging Results:  Relevant Lab Results:   Additional Information SSN: [id number],

## 2019-10-15 NOTE — Plan of Care (Signed)
Patient having an uneventful day.  Progressing towards a possible discharge back to [facility] Monday.  Patient oob to chair for 3 hours today.   Once in the morning patient behaved like a little baby.   Continues to have a poor appetite.    Problem: Education: Goal: Knowledge of risk factors and measures for prevention of condition will improve Outcome: Progressing   Problem: Coping: Goal: Psychosocial and spiritual needs will be supported Outcome: Progressing   Problem: Respiratory: Goal: Will maintain a patent airway Outcome: Progressing Goal: Complications related to the disease process, condition or treatment will be avoided or minimized Outcome: Progressing   Problem: Activity: Goal: Ability to tolerate increased activity will improve Outcome: Progressing   Problem: Clinical Measurements: Goal: Ability to maintain a body temperature in the normal range will improve Outcome: Progressing   Problem: Respiratory: Goal: Ability to maintain adequate ventilation will improve Outcome: Progressing   Problem: Urinary Elimination: Goal: Signs and symptoms of infection will decrease Outcome: Progressing

## 2019-10-15 NOTE — Discharge Summary (Signed)
Discharge Summary  Robert Cuevas:[id number] DOB: 1930/09/30  [facility]  Admit date: 10/11/2019 Anticipated discharge date: 10/16/2019  Time spent: 35 minutes  Recommendations for Outpatient Follow-up:  1. Patient needs to be sat upright for all meals 2. Sacral decubitus wound care: Apply Santyl to sacral wound daily  Discharge Diagnoses:  Active Hospital Problems   Diagnosis Date Noted  . Severe sepsis ([place]) 10/11/2019  . Acute lower UTI 10/12/2019  . Hypotension 10/03/2019  . Diabetes mellitus type 2, uncontrolled, with complications ([place]) 53/97/6734  . Acute on chronic respiratory failure with hypoxia ([place]) 09/25/2019  . COVID-19 virus infection 09/25/2019  . Long term (current) use of anticoagulants 02/23/2016  . Paroxysmal atrial fibrillation ([place]) 01/04/2015  . Dysphagia 05/03/2013  . Pacemaker- St Jude  03/06/2013  . Aspiration pneumonia ([place]) 03/06/2013  . Complete heart block ([place]) 09/19/2011    Resolved Hospital Problems  No resolved problems to display.    Discharge Condition: Improved, being discharged back to skilled nursing  Diet recommendation: Current diet recommendations: Dysphagia 1, thin liquids,  Vitals:   10/15/19 0736 10/15/19 1202  BP: 115/60 (!) 98/53  Pulse: 74 73  Resp:  18  Temp: 97.7 F (36.5 C) 97.6 F (36.4 C)  SpO2: 94% 99%    History of present illness:  85 year old male with past medical history of complete heart block status post pacemaker placement, paroxysmal atrial fibrillation on chronic Eliquis, diabetes mellitus type 2 and recent discharge from [facility] for COVID-19 pneumonia on 12/19 after a 2-week hospital stay, treated with IV Remdisivir and Decadron.  Patient was discharged on 1 L nasal cannula to skilled nursing.  He was brought back to the emergency room on 12/22 with shortness of breath and found to have severe sepsis and hypoxia from aspiration pneumonia.  Patient also noted to have a  large urinary tract infection   Hospital Course:  Principal Problem:   Severe sepsis ([place]) secondary to aspiration pneumonia (from dysphagia) causing acute respiratory failure with hypoxia: No evidence of recurrence of Covid.  Keeping in [facility] given he was just recently discharged.   Patient had nice recovery and was able to be weaned off of oxygen altogether by 12/26 evening.  Following admission, patient became more awake and alert with improvement in sepsis. Seen by speech therapy and placed on dysphagia 1 diet with thin liquids.   Home medications restarted.   By time of discharge, completed five full days of IV antibiotics.    Patient met criteria for severe sepsis on admission given tachypnea, leukocytosis with white blood cell count of 23.9 (was 10.7 a week prior) and organ dysfunction including hypotension with systolic blood pressure in the 80s and hypoxia initially requiring nonrebreather mask and 15 L high flow nasal cannula.  Hypoxia would not be attributed to aspiration pneumonia alone given significant improvement overnight with aggressive treatment of sepsis (currently down to 3 L nasal cannula).  White blood cell count normalized by 12/25. Procalcitonin level normalized by 12/26. Case management worked and patient accepted by skilled nursing and approved by insurance for 12/27.  Active Problems:   Complete heart block (HCC) status post pacemaker: Stable.    Paroxysmal atrial fibrillation (HCC) on Eliquis: We will resume Eliquis and stop heparin drip.    COVID-19 virus infection: Because patient was recently discharged, will keep in [facility].    Pressure injury of skin   Diabetes mellitus type 2, uncontrolled, with complications [facility]): Sliding scale only.  CBG stable    Hypotension: Secondary to sepsis, blood pressure have since stabilized and now are trending upward.  Will decrease IV fluids and resume home medications.  Urinary tract infection: Urine  growing out Pseudomonas and Enterococcus, near pansensitive.  Discussed with pharmacy and antibiotics narrowed to just Zosyn on 12/25. Completed five full days of IV antibiotics by discharge.  Stage II-three sacral decubitus wound: Present on admission. Secondary to limited ability to ambulate. Continued on wound care. Covering with Santyl. No evidence of acute new infection.   Procedures:  None  Consultations:  None  Discharge Exam: BP (!) 98/53 (BP Location: Right Arm)   Pulse 73   Temp 97.6 F (36.4 C) (Oral)   Resp 18   Ht _0  (1.727 m)   Wt 62.1 kg   SpO2 99%   BMI 20.83 kg/m   General: Alert and oriented x3, no acute distress Cardiovascular: Regular rate and rhythm, S1-S2 Respiratory: Clear to auscultation bilaterally  Discharge Instructions You were cared for by a hospitalist during your hospital stay. If you have any questions about your discharge medications or the care you received while you were in the hospital after you are discharged, you can call the unit and asked to speak with the hospitalist on call if the hospitalist that took care of you is not available. Once you are discharged, your primary care physician will handle any further medical issues. Please note that NO REFILLS for any discharge medications will be authorized once you are discharged, as it is imperative that you return to your primary care physician (or establish a relationship with a primary care physician if you do not have one) for your aftercare needs so that they can reassess your need for medications and monitor your lab values.  Discharge Instructions    DIET - DYS 1   Complete by: As directed    Medication given hold, in pure Please note that patient needs to be near fully upright for any meals or snacks   Fluid consistency: Thin   Increase activity slowly   Complete by: As directed      Allergies as of 10/15/2019   No Known Allergies     Medication List    TAKE these  medications   acetaminophen 325 MG tablet Commonly known as: TYLENOL Take 2 tablets (650 mg total) by mouth every 6 (six) hours as needed for mild pain (or Fever >/= 101).   apixaban 5 MG Tabs tablet Commonly known as: ELIQUIS Take 1 tablet (5 mg total) by mouth 2 (two) times daily.   atorvastatin 20 MG tablet Commonly known as: LIPITOR Take 1 tablet (20 mg total) by mouth daily.   carboxymethylcellulose 0.5 % Soln Commonly known as: REFRESH PLUS Place 1 drop into both eyes daily as needed (dry eyes).   cholecalciferol 25 MCG (1000 UT) tablet Commonly known as: VITAMIN D3 Take 1,000 Units by mouth daily.   diclofenac Sodium 1 % Gel Commonly known as: VOLTAREN Apply 1 application topically daily as needed (knee pain).   Fish Oil 1200 MG Caps Take 2,400 mg by mouth daily.   metFORMIN 500 MG tablet Commonly known as: GLUCOPHAGE Take by mouth 2 (two) times daily with a meal.   metoprolol tartrate 50 MG tablet Commonly known as: LOPRESSOR Take 1 tablet (50 mg total) by mouth 2 (two) times daily.   ramipril 2.5 MG capsule Commonly known as: ALTACE Take 2.5 mg by mouth daily.   Santyl ointment Generic drug: collagenase  Apply 1 application topically daily. Apply nickel thickness amount to sacrum wound daily      No Known Allergies Contact information for after-discharge care    Destination    [facility] Preferred SNF .   Service: Skilled Nursing Contact information: [place] [phone]               The results of significant diagnostics from this hospitalization (including imaging, microbiology, ancillary and laboratory) are listed below for reference.    Significant Diagnostic Studies: CT Head Wo Contrast  Result Date: 10/11/2019 CLINICAL DATA:  Altered mental status. COVID-19 positive with hypoxia EXAM: CT HEAD WITHOUT CONTRAST TECHNIQUE: Contiguous axial images were obtained from the base of the skull  through the vertex without intravenous contrast. COMPARISON:  None. FINDINGS: Brain: There is mild diffuse atrophy. There is a cavum septum pellucidum, an anatomic variant. There is no intracranial mass, hemorrhage, extra-axial fluid collection, or midline shift. There is patchy small vessel disease throughout the centra semiovale bilaterally. There is evidence of a prior infarct involving a portion of the anterior limb of the left external capsule. Scattered lacunar type infarcts are noted in the centra semiovale bilaterally. There is decreased attenuation at the gray-white junction the left mid frontal lobe, likely chronic. No acute appearing infarct is evident on this study. Vascular: There is no hyperdense vessel. There is calcification in each carotid siphon region. Skull: The bony calvarium appears intact. Sinuses/Orbits: There is mucosal thickening in several ethmoid air cells. There are air-fluid levels in the left and right sphenoid sinus regions. Other paranasal sinuses are clear. Orbits appear symmetric bilaterally. Other: The mastoid air cells are clear. IMPRESSION: 1. Atrophy with extensive supratentorial small vessel disease. 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 Probable prior infarct at the gray-white junction of the mid left frontal lobe. No acute appearing infarct is evident. No mass or hemorrhage. 2.  There are foci of arterial vascular calcification. 3. Areas of paranasal sinus disease, most notably in the sphenoid sinus regions with air-fluid levels in the sphenoid sinus regions bilaterally. Electronically Signed   By: Lowella Grip III M.D.   On: 10/11/2019 10:27   US Abdomen Complete  Result Date: 10/06/2019 CLINICAL DATA:  Elevated liver function tests.  Hyperbilirubinemia. EXAM: ABDOMEN ULTRASOUND COMPLETE COMPARISON:  CT chest 03/03/2013. FINDINGS: Gallbladder: No gallstones or wall thickening visualized. No sonographic Murphy  sign noted by sonographer. Common bile duct: Diameter: 0.4 cm Liver: No focal lesion. The liver appears heterogeneous with increased echogenicity. Portal vein is patent on color Doppler imaging with normal direction of blood flow towards the liver. IVC: No abnormality visualized. Pancreas: Visualized portion unremarkable. Spleen: Size and appearance within normal limits. Right Kidney: Length: 9.0. Echogenicity within normal limits. No mass or hydronephrosis visualized. Left Kidney: Length: 9.3 cm. Echogenicity within normal limits. No mass or hydronephrosis visualized. Abdominal aorta: No aneurysm visualized. Other findings: Small right pleural effusion is seen. IMPRESSION: Fatty infiltration of the liver. Negative for biliary dilatation. Small right pleural effusion. Electronically Signed   By: Inge Rise M.D.   On: 10/06/2019 11:47   DG CHEST PORT 1 VIEW  Result Date: 10/12/2019 CLINICAL DATA:  Shortness of breath EXAM: PORTABLE CHEST 1 VIEW COMPARISON:  10/11/2019 FINDINGS: Right pacer remains in place, unchanged. Cardiomegaly. Diffuse bilateral airspace disease, right greater than left. No visible significant effusions. No acute bony abnormality. IMPRESSION: Stable diffuse bilateral airspace disease, right greater than left.  Electronically Signed   By: Rolm Baptise M.D.   On: 10/12/2019 08:43   DG Chest Port 1 View  Result Date: 10/11/2019 CLINICAL DATA:  Fever.  Hypoxia.  History of COVID-19. EXAM: PORTABLE CHEST 1 VIEW COMPARISON:  09/29/2019. FINDINGS: Cardiac pacer with lead tips over the right atrium right ventricle. Stable cardiomegaly. Diffuse bilateral pulmonary infiltrates, right side greater than left. Diffuse right lung infiltrates progressed from prior exam. Low lung volumes particularly on the right. No pleural effusion or pneumothorax. IMPRESSION: 1.  Cardiac pacer noted stable position.  Stable cardiomegaly. 2. Diffuse bilateral pulmonary infiltrates, right side greater than left.  Diffuse right lung has progressed from prior exam. Low lung volumes, particularly on the right. Electronically Signed   By: Marcello Moores  Register   On: 10/11/2019 07:10   DG CHEST PORT 1 VIEW  Result Date: 09/29/2019 CLINICAL DATA:  COVID-19 pneumonia. EXAM: PORTABLE CHEST 1 VIEW COMPARISON:  Single view of the chest 09/25/2019. FINDINGS: Right worse than left airspace disease persists. Aeration in the lower lung zones has improved compared to the prior study. No pneumothorax or pleural effusion. Heart size is upper normal. Atherosclerosis and pacing device noted. IMPRESSION: Right worse than left airspace disease persists but has improved. Electronically Signed   By: Inge Rise M.D.   On: 09/29/2019 12:49   DG Chest Port 1 View  Result Date: 09/25/2019 CLINICAL DATA:  COVID-19 EXAM: PORTABLE CHEST 1 VIEW COMPARISON:  Portable exam 5427 hours compared to 09/24/2019 FINDINGS: RIGHT subclavian transvenous pacemaker leads project over RIGHT atrium and RIGHT ventricle. Enlargement of cardiac silhouette. Atherosclerotic calcification aorta. Mediastinal contours and pulmonary vascularity normal. Patchy airspace infiltrates bilaterally consistent with multifocal pneumonia and history of COVID-19, greater on RIGHT. Skin fold projects over LEFT chest. No pleural effusion or pneumothorax. Diffuse osseous demineralization with BILATERAL glenohumeral degenerative changes and probable BILATERAL chronic rotator cuff tears. IMPRESSION: Patchy BILATERAL pulmonary infiltrates RIGHT greater than LEFT consistent with multifocal pneumonia and history of COVID-19, little changed. Aortic Atherosclerosis (ICD10-I70.0). Electronically Signed   By: Lavonia Dana M.D.   On: 09/25/2019 15:28    Microbiology: Recent Results (from the past 240 hour(s))  Blood Culture (routine x 2)     Status: None (Preliminary result)   Collection Time: 10/11/19  6:42 AM   Specimen: BLOOD  Result Value Ref Range Status   Specimen Description    Final    BLOOD LEFT ARM Performed at [facility] [address] [place], [place] [address]    Special Requests   Final    BOTTLES DRAWN AEROBIC AND ANAEROBIC Blood Culture results may not be optimal due to an excessive volume of blood received in culture bottles Performed at [facility] [address], [place], [place] [address]    Culture   Final    NO GROWTH 4 DAYS Performed at [facility], [place] [address], [place], [place] [address]    Report Status PENDING  Incomplete  Blood Culture (routine x 2)     Status: None (Preliminary result)   Collection Time: 10/11/19  6:42 AM   Specimen: BLOOD  Result Value Ref Range Status   Specimen Description   Final    BLOOD RIGHT ARM Performed at [facility] [address], [place], [place] [address]    Special Requests   Final    BOTTLES DRAWN AEROBIC AND ANAEROBIC Blood Culture adequate volume Performed at [facility] [address] [place], [place]  [address]    Culture   Final    NO GROWTH 4 DAYS Performed at [facility], [place] [address], [place], [place] [address]    Report Status PENDING  Incomplete  Urine culture     Status: Abnormal   Collection Time: 10/11/19  6:42 AM   Specimen: In/Out Cath Urine  Result Value Ref Range Status   Specimen Description   Final    IN/OUT CATH URINE Performed at [facility] [address] [place], [place] [address]    Special Requests   Final    NONE Performed at [facility], [place] [address], [place], [place] [address]    Culture (A)  Final    >=100,000 COLONIES/mL PSEUDOMONAS AERUGINOSA >=100,000 COLONIES/mL ENTEROCOCCUS FAECALIS    Report Status 10/14/2019 FINAL  Final   Organism ID, Bacteria PSEUDOMONAS AERUGINOSA (A)  Final   Organism ID, Bacteria ENTEROCOCCUS FAECALIS (A)  Final      Susceptibility   Enterococcus faecalis - MIC*    AMPICILLIN <=2  SENSITIVE Sensitive     NITROFURANTOIN <=16 SENSITIVE Sensitive     VANCOMYCIN 1 SENSITIVE Sensitive     * >=100,000 COLONIES/mL ENTEROCOCCUS FAECALIS   Pseudomonas aeruginosa - MIC*    CEFTAZIDIME 4 SENSITIVE Sensitive     CIPROFLOXACIN <=0.25 SENSITIVE Sensitive     GENTAMICIN <=1 SENSITIVE Sensitive     IMIPENEM 1 SENSITIVE Sensitive     PIP/TAZO 8 SENSITIVE Sensitive     CEFEPIME 2 SENSITIVE Sensitive     * >=100,000 COLONIES/mL PSEUDOMONAS AERUGINOSA     Labs: Basic Metabolic Panel: Recent Labs  Lab 10/11/19 0623 10/12/19 0330 10/13/19 0330 10/14/19 0615 10/15/19 0430  NA 132* 136 135 135 136  K 4.6 3.6 3.2* 4.5 4.4  CL 100 106 103 101 102  CO2 _0 GLUCOSE 173* 123* 144* 111* 103*  BUN 32* _1 CREATININE 0.77 0.57* 0.48* 0.56* 0.59*  CALCIUM 8.6* 8.2* 8.0* 8.2* 8.2*  MG  --  1.6* 1.8  --   --   PHOS  --  2.9  --   --   --    Liver Function Tests: Recent Labs  Lab 10/11/19 0623 10/12/19 0330  AST 41 29  ALT 64* 46*  ALKPHOS 126 124  BILITOT 3.2* 1.8*  PROT 5.6* 5.2*  ALBUMIN 3.1* 2.7*   No results for input(s): LIPASE, AMYLASE in the last 168 hours. No results for input(s): AMMONIA in the last 168 hours. CBC: Recent Labs  Lab 10/11/19 0623 10/12/19 0330 10/13/19 0330 10/14/19 0615 10/15/19 0430  WBC 23.9* 15.4* 10.1 8.1 6.4  NEUTROABS 21.3* 13.5*  --   --   --   HGB 17.0 15.3 14.4 15.9 15.4  HCT 50.8 46.3 43.0 47.3 45.8  MCV 94.8 95.9 94.3 94.2 93.9  PLT 114* 87* 102* 108* 127*   Cardiac Enzymes: No results for input(s): CKTOTAL, CKMB, CKMBINDEX, TROPONINI in the last 168 hours. BNP: BNP (last 3 results) Recent Labs    10/15/19 0430  BNP 274.0*    ProBNP (last 3 results) No results for input(s): PROBNP in the last 8760 hours.  CBG: Recent Labs  Lab 10/11/19 0634 10/11/19 1537 10/13/19 0854 10/13/19 1152 10/15/19 0732  GLUCAP 172* 129* 141* 143* 121*       Signed:  Annita Brod, MD [facility] 10/15/2019, 4:15 PM

## 2019-10-16 DIAGNOSIS — J8489 Other specified interstitial pulmonary diseases: Secondary | ICD-10-CM | POA: Diagnosis not present

## 2019-10-16 DIAGNOSIS — N390 Urinary tract infection, site not specified: Secondary | ICD-10-CM | POA: Diagnosis not present

## 2019-10-16 DIAGNOSIS — E876 Hypokalemia: Secondary | ICD-10-CM | POA: Diagnosis not present

## 2019-10-16 DIAGNOSIS — I351 Nonrheumatic aortic (valve) insufficiency: Secondary | ICD-10-CM | POA: Diagnosis not present

## 2019-10-16 DIAGNOSIS — R0902 Hypoxemia: Secondary | ICD-10-CM | POA: Diagnosis not present

## 2019-10-16 DIAGNOSIS — T380X5A Adverse effect of glucocorticoids and synthetic analogues, initial encounter: Secondary | ICD-10-CM | POA: Diagnosis not present

## 2019-10-16 DIAGNOSIS — Z7401 Bed confinement status: Secondary | ICD-10-CM | POA: Diagnosis not present

## 2019-10-16 DIAGNOSIS — U071 COVID-19: Secondary | ICD-10-CM | POA: Diagnosis not present

## 2019-10-16 DIAGNOSIS — B965 Pseudomonas (aeruginosa) (mallei) (pseudomallei) as the cause of diseases classified elsewhere: Secondary | ICD-10-CM | POA: Diagnosis not present

## 2019-10-16 DIAGNOSIS — R410 Disorientation, unspecified: Secondary | ICD-10-CM | POA: Diagnosis not present

## 2019-10-16 DIAGNOSIS — H9190 Unspecified hearing loss, unspecified ear: Secondary | ICD-10-CM | POA: Diagnosis not present

## 2019-10-16 DIAGNOSIS — M2550 Pain in unspecified joint: Secondary | ICD-10-CM | POA: Diagnosis not present

## 2019-10-16 DIAGNOSIS — Z950 Presence of cardiac pacemaker: Secondary | ICD-10-CM | POA: Diagnosis not present

## 2019-10-16 DIAGNOSIS — Z66 Do not resuscitate: Secondary | ICD-10-CM | POA: Diagnosis not present

## 2019-10-16 DIAGNOSIS — E1151 Type 2 diabetes mellitus with diabetic peripheral angiopathy without gangrene: Secondary | ICD-10-CM | POA: Diagnosis not present

## 2019-10-16 DIAGNOSIS — R1310 Dysphagia, unspecified: Secondary | ICD-10-CM | POA: Diagnosis not present

## 2019-10-16 DIAGNOSIS — Z8616 Personal history of COVID-19: Secondary | ICD-10-CM | POA: Diagnosis not present

## 2019-10-16 DIAGNOSIS — Z681 Body mass index (BMI) 19 or less, adult: Secondary | ICD-10-CM | POA: Diagnosis not present

## 2019-10-16 DIAGNOSIS — E7800 Pure hypercholesterolemia, unspecified: Secondary | ICD-10-CM | POA: Diagnosis not present

## 2019-10-16 DIAGNOSIS — I442 Atrioventricular block, complete: Secondary | ICD-10-CM | POA: Diagnosis not present

## 2019-10-16 DIAGNOSIS — R0602 Shortness of breath: Secondary | ICD-10-CM | POA: Diagnosis not present

## 2019-10-16 DIAGNOSIS — Z7901 Long term (current) use of anticoagulants: Secondary | ICD-10-CM | POA: Diagnosis not present

## 2019-10-16 DIAGNOSIS — J1289 Other viral pneumonia: Secondary | ICD-10-CM | POA: Diagnosis not present

## 2019-10-16 DIAGNOSIS — E785 Hyperlipidemia, unspecified: Secondary | ICD-10-CM | POA: Diagnosis not present

## 2019-10-16 DIAGNOSIS — Z7189 Other specified counseling: Secondary | ICD-10-CM | POA: Diagnosis not present

## 2019-10-16 DIAGNOSIS — J9601 Acute respiratory failure with hypoxia: Secondary | ICD-10-CM | POA: Diagnosis not present

## 2019-10-16 DIAGNOSIS — I2699 Other pulmonary embolism without acute cor pulmonale: Secondary | ICD-10-CM | POA: Diagnosis not present

## 2019-10-16 DIAGNOSIS — A419 Sepsis, unspecified organism: Secondary | ICD-10-CM | POA: Diagnosis not present

## 2019-10-16 DIAGNOSIS — E119 Type 2 diabetes mellitus without complications: Secondary | ICD-10-CM | POA: Diagnosis not present

## 2019-10-16 DIAGNOSIS — J90 Pleural effusion, not elsewhere classified: Secondary | ICD-10-CM | POA: Diagnosis not present

## 2019-10-16 DIAGNOSIS — I361 Nonrheumatic tricuspid (valve) insufficiency: Secondary | ICD-10-CM | POA: Diagnosis not present

## 2019-10-16 DIAGNOSIS — I350 Nonrheumatic aortic (valve) stenosis: Secondary | ICD-10-CM | POA: Diagnosis not present

## 2019-10-16 DIAGNOSIS — Z85828 Personal history of other malignant neoplasm of skin: Secondary | ICD-10-CM | POA: Diagnosis not present

## 2019-10-16 DIAGNOSIS — I480 Paroxysmal atrial fibrillation: Secondary | ICD-10-CM | POA: Diagnosis not present

## 2019-10-16 DIAGNOSIS — J690 Pneumonitis due to inhalation of food and vomit: Secondary | ICD-10-CM | POA: Diagnosis not present

## 2019-10-16 DIAGNOSIS — R6520 Severe sepsis without septic shock: Secondary | ICD-10-CM | POA: Diagnosis not present

## 2019-10-16 DIAGNOSIS — E43 Unspecified severe protein-calorie malnutrition: Secondary | ICD-10-CM | POA: Diagnosis not present

## 2019-10-16 DIAGNOSIS — L89152 Pressure ulcer of sacral region, stage 2: Secondary | ICD-10-CM | POA: Diagnosis not present

## 2019-10-16 DIAGNOSIS — Z7984 Long term (current) use of oral hypoglycemic drugs: Secondary | ICD-10-CM | POA: Diagnosis not present

## 2019-10-16 DIAGNOSIS — I5022 Chronic systolic (congestive) heart failure: Secondary | ICD-10-CM | POA: Diagnosis not present

## 2019-10-16 DIAGNOSIS — Z515 Encounter for palliative care: Secondary | ICD-10-CM | POA: Diagnosis not present

## 2019-10-16 DIAGNOSIS — I10 Essential (primary) hypertension: Secondary | ICD-10-CM | POA: Diagnosis not present

## 2019-10-16 DIAGNOSIS — J9621 Acute and chronic respiratory failure with hypoxia: Secondary | ICD-10-CM | POA: Diagnosis not present

## 2019-10-16 DIAGNOSIS — T17908S Unspecified foreign body in respiratory tract, part unspecified causing other injury, sequela: Secondary | ICD-10-CM | POA: Diagnosis not present

## 2019-10-16 DIAGNOSIS — E1165 Type 2 diabetes mellitus with hyperglycemia: Secondary | ICD-10-CM | POA: Diagnosis not present

## 2019-10-16 LAB — CBC
HCT: 45.9 % (ref 39.0–52.0)
Hemoglobin: 15.3 g/dL (ref 13.0–17.0)
MCH: 31.4 pg (ref 26.0–34.0)
MCHC: 33.3 g/dL (ref 30.0–36.0)
MCV: 94.3 fL (ref 80.0–100.0)
Platelets: 151 10*3/uL (ref 150–400)
RBC: 4.87 MIL/uL (ref 4.22–5.81)
RDW: 15.1 % (ref 11.5–15.5)
WBC: 5.9 10*3/uL (ref 4.0–10.5)
nRBC: 0 % (ref 0.0–0.2)

## 2019-10-16 LAB — CULTURE, BLOOD (ROUTINE X 2)
Culture: NO GROWTH
Culture: NO GROWTH
Special Requests: ADEQUATE

## 2019-10-16 NOTE — Discharge Instructions (Signed)

## 2019-10-16 NOTE — TOC Transition Note (Signed)
Transition of Care [facility]) - CM/SW Discharge Note   Patient Details  Name: Robert Cuevas MRN: [id number] Date of Birth: October 27, 1929  [facility] CM/SW Contact:  Deionna Marcantonio, Francetta Found, LCSW Phone Number: 10/16/2019, 12:10 PM   Clinical Narrative:     Clinical Social Worker facilitated patient discharge including contacting patient family and facility to confirm patient discharge plans.  Clinical information faxed to facility and family agreeable with plan.  CSW arranged ambulance transport via [facility] to [facility].  RN to call report prior to discharge ([phone]).  Insurance Authorization Received 10/15/19 Room Number 805A  Clinical Social Worker will sign off for now as social work intervention is no longer needed. Please consult [place] again if new need arises.  Final next level of care: Skilled Nursing Facility Barriers to Discharge: Barriers Resolved   Patient Goals and CMS Choice Patient states their goals for this hospitalization and ongoing recovery are:: Patient wife wants to take home in a week   Choice offered to / list presented to : Spouse  Discharge Placement   Existing PASRR number confirmed : 10/15/19          Patient [facility] bed at: [facility] Patient to be transferred to facility by: Ambulance Name of family member notified: Liam Rogers, wife by phone Patient and family notified of of transfer: 10/16/19  Discharge Plan and Services In-house Referral: Clinical Social Work                                   Social Determinants of Health ([place]) Interventions     Readmission Risk Interventions Readmission Risk Prevention Plan 10/02/2019  Medication Review (RN CM) Complete  Some recent data might be hidden

## 2019-10-16 NOTE — Discharge Summary (Addendum)
Discharge Summary  Robert Cuevas LFY:[id number] DOB: 09-Sep-1930  PCP: [facility]  Admit date: 10/11/2019 Anticipated discharge date: 10/16/2019  Time spent: 35 minutes  Recommendations for Outpatient Follow-up:  1. Patient needs to be sat upright for all meals 2. Sacral decubitus wound care: Apply Santyl to sacral wound daily 3. Speech therapy needs to see patient in the next 24-48 hours upon arrival to Skilled nursing for evaluation & possible upgrade of diet, if appropriate 4. Oxygen at 2 liters [place]. (May be able to wean off over next few days)  Discharge Diagnoses:  Active Hospital Problems   Diagnosis Date Noted  . Severe sepsis ([place]) 10/11/2019  . Acute lower UTI 10/12/2019  . Hypotension 10/03/2019  . Diabetes mellitus type 2, uncontrolled, with complications ([place]) 85/27/7824  . Acute on chronic respiratory failure with hypoxia ([place]) 09/25/2019  . COVID-19 virus infection 09/25/2019  . Long term (current) use of anticoagulants 02/23/2016  . Paroxysmal atrial fibrillation ([place]) 01/04/2015  . Dysphagia 05/03/2013  . Pacemaker- St Jude  03/06/2013  . Aspiration pneumonia ([place]) 03/06/2013  . Complete heart block ([place]) 09/19/2011    Resolved Hospital Problems  No resolved problems to display.    Discharge Condition: Improved, being discharged back to skilled nursing  Diet recommendation: Current diet recommendations: Dysphagia 1, thin liquids, heart healthy  Vitals:   10/16/19 0500 10/16/19 0600  BP:    Pulse: 74 70  Resp: 20 17  Temp:    SpO2: 93% 94%    History of present illness:  85 year old male with past medical history of complete heart block status post pacemaker placement, paroxysmal atrial fibrillation on chronic Eliquis, diabetes mellitus type 2 and recent discharge from [facility] for COVID-19 pneumonia on 12/19 after a 2-week hospital stay, treated with IV Remdisivir and Decadron.  Patient was discharged on 1 L nasal cannula to  skilled nursing.  He was brought back to the emergency room on 12/22 with shortness of breath and found to have severe sepsis and hypoxia from aspiration pneumonia.  Patient also noted to have a large urinary tract infection   Hospital Course:  Principal Problem:   Severe sepsis ([place]) secondary to aspiration pneumonia (from dysphagia) causing acute respiratory failure with hypoxia: No evidence of recurrence of Covid.  Keeping in [facility] given he was just recently discharged.   Patient had nice recovery and was able to be weaned off of oxygen altogether by 12/26 evening.  Following admission, patient became more awake and alert with improvement in sepsis. Seen by speech therapy and placed on dysphagia 1 diet with thin liquids.   Home medications restarted.   By time of discharge, completed five full days of IV antibiotics.    Patient met criteria for severe sepsis on admission given tachypnea, leukocytosis with white blood cell count of 23.9 (was 10.7 a week prior) and organ dysfunction including hypotension with systolic blood pressure in the 80s and hypoxia initially requiring nonrebreather mask and 15 L high flow nasal cannula.  Hypoxia would not be attributed to aspiration pneumonia alone given significant improvement overnight with aggressive treatment of sepsis (down to 3 L nasal cannula by following day).  White blood cell count normalized by 12/25. Procalcitonin level normalized by 12/26. Case management worked and patient accepted by skilled nursing and approved by insurance for 12/27.    By 12/26, O2 at 96% on 2 liters.  Attempted to wean off & his O2 sats at times stay above 90%, other times drop below  87% requiring 2 liters.  Recommending he be discharged on 2 liters with attempts to wean at hospital.  Active Problems:   Complete heart block ([place]) status post pacemaker: Stable.    Paroxysmal atrial fibrillation (HCC) on Eliquis: We will resume Eliquis and stop heparin drip.     COVID-19 virus infection: Because patient was recently discharged, will keep in [facility].    Pressure injury of skin   Diabetes mellitus type 2, uncontrolled, with complications [facility]): Sliding scale only.  CBG stable    Hypotension: Secondary to sepsis, blood pressure have since stabilized and now are trending upward.  Will decrease IV fluids and resume home medications.  Urinary tract infection: Urine growing out Pseudomonas and Enterococcus, near pansensitive.  Discussed with pharmacy and antibiotics narrowed to just Zosyn on 12/25. Completed five full days of IV antibiotics by discharge.  Stage II-three sacral decubitus wound: Present on admission. Secondary to limited ability to ambulate. Continued on wound care. Covering with Santyl. No evidence of acute new infection.   Procedures:  None  Consultations:  None  Discharge Exam: BP (!) 103/48   Pulse 70   Temp 97.6 F (36.4 C) (Oral)   Resp 17   Ht '5\' 8"'$  (1.727 m)   Wt 62.1 kg   SpO2 94%   BMI 20.83 kg/m   General: Alert and oriented x3, no acute distress Cardiovascular: Regular rate and rhythm, S1-S2 Respiratory: Clear to auscultation bilaterally  Discharge Instructions You were cared for by a hospitalist during your hospital stay. If you have any questions about your discharge medications or the care you received while you were in the hospital after you are discharged, you can call the unit and asked to speak with the hospitalist on call if the hospitalist that took care of you is not available. Once you are discharged, your primary care physician will handle any further medical issues. Please note that NO REFILLS for any discharge medications will be authorized once you are discharged, as it is imperative that you return to your primary care physician (or establish a relationship with a primary care physician if you do not have one) for your aftercare needs so that they can reassess your need for medications  and monitor your lab values.  Discharge Instructions    DIET - DYS 1   Complete by: As directed    Medication given hold, in pure Please note that patient needs to be near fully upright for any meals or snacks   Fluid consistency: Thin   Increase activity slowly   Complete by: As directed      Allergies as of 10/16/2019   No Known Allergies     Medication List    TAKE these medications   acetaminophen 325 MG tablet Commonly known as: TYLENOL Take 2 tablets (650 mg total) by mouth every 6 (six) hours as needed for mild pain (or Fever >/= 101).   apixaban 5 MG Tabs tablet Commonly known as: ELIQUIS Take 1 tablet (5 mg total) by mouth 2 (two) times daily.   atorvastatin 20 MG tablet Commonly known as: LIPITOR Take 1 tablet (20 mg total) by mouth daily.   carboxymethylcellulose 0.5 % Soln Commonly known as: REFRESH PLUS Place 1 drop into both eyes daily as needed (dry eyes).   cholecalciferol 25 MCG (1000 UT) tablet Commonly known as: VITAMIN D3 Take 1,000 Units by mouth daily.   diclofenac Sodium 1 % Gel Commonly known as: VOLTAREN Apply 1 application topically daily as  needed (knee pain).   Fish Oil 1200 MG Caps Take 2,400 mg by mouth daily.   metFORMIN 500 MG tablet Commonly known as: GLUCOPHAGE Take by mouth 2 (two) times daily with a meal.   metoprolol tartrate 50 MG tablet Commonly known as: LOPRESSOR Take 1 tablet (50 mg total) by mouth 2 (two) times daily.   ramipril 2.5 MG capsule Commonly known as: ALTACE Take 2.5 mg by mouth daily.   Santyl ointment Generic drug: collagenase Apply 1 application topically daily. Apply nickel thickness amount to sacrum wound daily      No Known Allergies Contact information for after-discharge care    Destination    [facility]erred [facility] .   Service: Skilled Nursing Contact information: [place] [phone]               The results of significant  diagnostics from this hospitalization (including imaging, microbiology, ancillary and laboratory) are listed below for reference.    Significant Diagnostic Studies: CT Head Wo Contrast  Result Date: 10/11/2019 CLINICAL DATA:  Altered mental status. COVID-19 positive with hypoxia EXAM: CT HEAD WITHOUT CONTRAST TECHNIQUE: Contiguous axial images were obtained from the base of the skull through the vertex without intravenous contrast. COMPARISON:  None. FINDINGS: Brain: There is mild diffuse atrophy. There is a cavum septum pellucidum, an anatomic variant. There is no intracranial mass, hemorrhage, extra-axial fluid collection, or midline shift. There is patchy small vessel disease throughout the centra semiovale bilaterally. There is evidence of a prior infarct involving a portion of the anterior limb of the left external capsule. Scattered lacunar type infarcts are noted in the centra semiovale bilaterally. There is decreased attenuation at the gray-white junction the left mid frontal lobe, likely chronic. No acute appearing infarct is evident on this study. Vascular: There is no hyperdense vessel. There is calcification in each carotid siphon region. Skull: The bony calvarium appears intact. Sinuses/Orbits: There is mucosal thickening in several ethmoid air cells. There are air-fluid levels in the left and right sphenoid sinus regions. Other paranasal sinuses are clear. Orbits appear symmetric bilaterally. Other: The mastoid air cells are clear. IMPRESSION: 1. Atrophy with extensive supratentorial small vessel disease. 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 Probable prior infarct at the gray-white junction of the mid left frontal lobe. No acute appearing infarct is evident. No mass or hemorrhage. 2.  There are foci of arterial vascular calcification. 3. Areas of paranasal sinus disease, most notably in the sphenoid sinus regions with air-fluid levels in the  sphenoid sinus regions bilaterally. Electronically Signed   By: Lowella Grip III M.D.   On: 10/11/2019 10:27   US Abdomen Complete  Result Date: 10/06/2019 CLINICAL DATA:  Elevated liver function tests.  Hyperbilirubinemia. EXAM: ABDOMEN ULTRASOUND COMPLETE COMPARISON:  CT chest 03/03/2013. FINDINGS: Gallbladder: No gallstones or wall thickening visualized. No sonographic Murphy sign noted by sonographer. Common bile duct: Diameter: 0.4 cm Liver: No focal lesion. The liver appears heterogeneous with increased echogenicity. Portal vein is patent on color Doppler imaging with normal direction of blood flow towards the liver. IVC: No abnormality visualized. Pancreas: Visualized portion unremarkable. Spleen: Size and appearance within normal limits. Right Kidney: Length: 9.0. Echogenicity within normal limits. No mass or hydronephrosis visualized. Left Kidney: Length: 9.3 cm. Echogenicity within normal limits. No mass or hydronephrosis visualized. Abdominal aorta: No aneurysm visualized. Other findings: Small right pleural effusion is seen. IMPRESSION: Fatty infiltration of the  liver. Negative for biliary dilatation. Small right pleural effusion. Electronically Signed   By: Inge Rise M.D.   On: 10/06/2019 11:47   DG CHEST PORT 1 VIEW  Result Date: 10/12/2019 CLINICAL DATA:  Shortness of breath EXAM: PORTABLE CHEST 1 VIEW COMPARISON:  10/11/2019 FINDINGS: Right pacer remains in place, unchanged. Cardiomegaly. Diffuse bilateral airspace disease, right greater than left. No visible significant effusions. No acute bony abnormality. IMPRESSION: Stable diffuse bilateral airspace disease, right greater than left. Electronically Signed   By: Rolm Baptise M.D.   On: 10/12/2019 08:43   DG Chest Port 1 View  Result Date: 10/11/2019 CLINICAL DATA:  Fever.  Hypoxia.  History of COVID-19. EXAM: PORTABLE CHEST 1 VIEW COMPARISON:  09/29/2019. FINDINGS: Cardiac pacer with lead tips over the right atrium right  ventricle. Stable cardiomegaly. Diffuse bilateral pulmonary infiltrates, right side greater than left. Diffuse right lung infiltrates progressed from prior exam. Low lung volumes particularly on the right. No pleural effusion or pneumothorax. IMPRESSION: 1.  Cardiac pacer noted stable position.  Stable cardiomegaly. 2. Diffuse bilateral pulmonary infiltrates, right side greater than left. Diffuse right lung has progressed from prior exam. Low lung volumes, particularly on the right. Electronically Signed   By: Marcello Moores  Register   On: 10/11/2019 07:10   DG CHEST PORT 1 VIEW  Result Date: 09/29/2019 CLINICAL DATA:  COVID-19 pneumonia. EXAM: PORTABLE CHEST 1 VIEW COMPARISON:  Single view of the chest 09/25/2019. FINDINGS: Right worse than left airspace disease persists. Aeration in the lower lung zones has improved compared to the prior study. No pneumothorax or pleural effusion. Heart size is upper normal. Atherosclerosis and pacing device noted. IMPRESSION: Right worse than left airspace disease persists but has improved. Electronically Signed   By: Inge Rise M.D.   On: 09/29/2019 12:49   DG Chest Port 1 View  Result Date: 09/25/2019 CLINICAL DATA:  COVID-19 EXAM: PORTABLE CHEST 1 VIEW COMPARISON:  Portable exam 7253 hours compared to 09/24/2019 FINDINGS: RIGHT subclavian transvenous pacemaker leads project over RIGHT atrium and RIGHT ventricle. Enlargement of cardiac silhouette. Atherosclerotic calcification aorta. Mediastinal contours and pulmonary vascularity normal. Patchy airspace infiltrates bilaterally consistent with multifocal pneumonia and history of COVID-19, greater on RIGHT. Skin fold projects over LEFT chest. No pleural effusion or pneumothorax. Diffuse osseous demineralization with BILATERAL glenohumeral degenerative changes and probable BILATERAL chronic rotator cuff tears. IMPRESSION: Patchy BILATERAL pulmonary infiltrates RIGHT greater than LEFT consistent with multifocal pneumonia  and history of COVID-19, little changed. Aortic Atherosclerosis (ICD10-I70.0). Electronically Signed   By: Lavonia Dana M.D.   On: 09/25/2019 15:28    Microbiology: Recent Results (from the past 240 hour(s))  Blood Culture (routine x 2)     Status: None   Collection Time: 10/11/19  6:42 AM   Specimen: BLOOD  Result Value Ref Range Status   Specimen Description   Final    BLOOD LEFT ARM Performed at [facility] [address], [place], [place] [address]    Special Requests   Final    BOTTLES DRAWN AEROBIC AND ANAEROBIC Blood Culture results may not be optimal due to an excessive volume of blood received in culture bottles Performed at [facility] [address], [place], [place] [address]    Culture   Final    NO GROWTH 5 DAYS Performed at [facility], [place] [address] [place], [place] [address]    Report Status 10/16/2019 FINAL  Final  Blood Culture (routine x 2)  Status: None   Collection Time: 10/11/19  6:42 AM   Specimen: BLOOD  Result Value Ref Range Status   Specimen Description   Final    BLOOD RIGHT ARM Performed at [facility] [address] [place], [place] [address]    Special Requests   Final    BOTTLES DRAWN AEROBIC AND ANAEROBIC Blood Culture adequate volume Performed at [facility] [address], [place], [place] [address]    Culture   Final    NO GROWTH 5 DAYS Performed at [facility], [place] [address], [place], [place] [address]    Report Status 10/16/2019 FINAL  Final  Urine culture     Status: Abnormal   Collection Time: 10/11/19  6:42 AM   Specimen: In/Out Cath Urine  Result Value Ref Range Status   Specimen Description   Final    IN/OUT CATH URINE Performed at [facility] [address] [place], [place] [address]    Special Requests   Final    NONE Performed at [facility], [place] [address], [place], [place]  [address]    Culture (A)  Final    >=100,000 COLONIES/mL PSEUDOMONAS AERUGINOSA >=100,000 COLONIES/mL ENTEROCOCCUS FAECALIS    Report Status 10/14/2019 FINAL  Final   Organism ID, Bacteria PSEUDOMONAS AERUGINOSA (A)  Final   Organism ID, Bacteria ENTEROCOCCUS FAECALIS (A)  Final      Susceptibility   Enterococcus faecalis - MIC*    AMPICILLIN <=2 SENSITIVE Sensitive     NITROFURANTOIN <=16 SENSITIVE Sensitive     VANCOMYCIN 1 SENSITIVE Sensitive     * >=100,000 COLONIES/mL ENTEROCOCCUS FAECALIS   Pseudomonas aeruginosa - MIC*    CEFTAZIDIME 4 SENSITIVE Sensitive     CIPROFLOXACIN <=0.25 SENSITIVE Sensitive     GENTAMICIN <=1 SENSITIVE Sensitive     IMIPENEM 1 SENSITIVE Sensitive     PIP/TAZO 8 SENSITIVE Sensitive     CEFEPIME 2 SENSITIVE Sensitive     * >=100,000 COLONIES/mL PSEUDOMONAS AERUGINOSA     Labs: Basic Metabolic Panel: Recent Labs  Lab 10/11/19 0623 10/12/19 0330 10/13/19 0330 10/14/19 0615 10/15/19 0430  NA 132* 136 135 135 136  K 4.6 3.6 3.2* 4.5 4.4  CL 100 106 103 101 102  CO2 '23 23 24 23 25  '$ GLUCOSE 173* 123* 144* 111* 103*  BUN 32* '20 13 12 11  '$ CREATININE 0.77 0.57* 0.48* 0.56* 0.59*  CALCIUM 8.6* 8.2* 8.0* 8.2* 8.2*  MG  --  1.6* 1.8  --   --   PHOS  --  2.9  --   --   --    Liver Function Tests: Recent Labs  Lab 10/11/19 0623 10/12/19 0330  AST 41 29  ALT 64* 46*  ALKPHOS 126 124  BILITOT 3.2* 1.8*  PROT 5.6* 5.2*  ALBUMIN 3.1* 2.7*   No results for input(s): LIPASE, AMYLASE in the last 168 hours. No results for input(s): AMMONIA in the last 168 hours. CBC: Recent Labs  Lab 10/11/19 0623 10/12/19 0330 10/13/19 0330 10/14/19 0615 10/15/19 0430 10/16/19 0344  WBC 23.9* 15.4* 10.1 8.1 6.4 5.9  NEUTROABS 21.3* 13.5*  --   --   --   --   HGB 17.0 15.3 14.4 15.9 15.4 15.3  HCT 50.8 46.3 43.0 47.3 45.8 45.9  MCV 94.8 95.9 94.3 94.2 93.9 94.3  PLT 114* 87* 102* 108* 127* 151   Cardiac Enzymes: No results for input(s): CKTOTAL, CKMB,  CKMBINDEX,  TROPONINI in the last 168 hours. BNP: BNP (last 3 results) Recent Labs    10/15/19 0430  BNP 274.0*    ProBNP (last 3 results) No results for input(s): PROBNP in the last 8760 hours.  CBG: Recent Labs  Lab 10/11/19 0634 10/11/19 1537 10/13/19 0854 10/13/19 1152 10/15/19 0732  GLUCAP 172* 129* 141* 143* 121*       Signed:  Annita Brod, MD [facility] 10/16/2019, 11:12 AM

## 2019-10-16 NOTE — Plan of Care (Signed)
  Problem: Education: Goal: Knowledge of risk factors and measures for prevention of condition will improve Outcome: Adequate for Discharge   Problem: Coping: Goal: Psychosocial and spiritual needs will be supported Outcome: Adequate for Discharge   Problem: Respiratory: Goal: Will maintain a patent airway Outcome: Adequate for Discharge Goal: Complications related to the disease process, condition or treatment will be avoided or minimized Outcome: Adequate for Discharge   Problem: Activity: Goal: Ability to tolerate increased activity will improve Outcome: Adequate for Discharge   Problem: Clinical Measurements: Goal: Ability to maintain a body temperature in the normal range will improve Outcome: Adequate for Discharge   Problem: Respiratory: Goal: Ability to maintain adequate ventilation will improve Outcome: Adequate for Discharge Goal: Ability to maintain a clear airway will improve Outcome: Adequate for Discharge   Problem: Urinary Elimination: Goal: Signs and symptoms of infection will decrease Outcome: Adequate for Discharge

## 2019-10-17 ENCOUNTER — Inpatient Hospital Stay (HOSPITAL_COMMUNITY): Payer: No Typology Code available for payment source

## 2019-10-17 ENCOUNTER — Inpatient Hospital Stay (HOSPITAL_COMMUNITY)
Admission: EM | Admit: 2019-10-17 | Discharge: 2019-10-25 | DRG: 177 | Disposition: A | Discharge status: Hospice | Payer: No Typology Code available for payment source | Source: Skilled Nursing Facility | Attending: Student | Admitting: Student

## 2019-10-17 ENCOUNTER — Emergency Department (HOSPITAL_COMMUNITY): Payer: No Typology Code available for payment source

## 2019-10-17 ENCOUNTER — Encounter (HOSPITAL_COMMUNITY): Payer: Self-pay

## 2019-10-17 ENCOUNTER — Other Ambulatory Visit: Payer: Self-pay

## 2019-10-17 DIAGNOSIS — I5022 Chronic systolic (congestive) heart failure: Secondary | ICD-10-CM | POA: Diagnosis not present

## 2019-10-17 DIAGNOSIS — E860 Dehydration: Secondary | ICD-10-CM | POA: Diagnosis present

## 2019-10-17 DIAGNOSIS — E1151 Type 2 diabetes mellitus with diabetic peripheral angiopathy without gangrene: Secondary | ICD-10-CM | POA: Diagnosis present

## 2019-10-17 DIAGNOSIS — Z950 Presence of cardiac pacemaker: Secondary | ICD-10-CM | POA: Diagnosis present

## 2019-10-17 DIAGNOSIS — B952 Enterococcus as the cause of diseases classified elsewhere: Secondary | ICD-10-CM | POA: Diagnosis present

## 2019-10-17 DIAGNOSIS — R636 Underweight: Secondary | ICD-10-CM | POA: Diagnosis present

## 2019-10-17 DIAGNOSIS — E7800 Pure hypercholesterolemia, unspecified: Secondary | ICD-10-CM | POA: Diagnosis not present

## 2019-10-17 DIAGNOSIS — U071 COVID-19: Secondary | ICD-10-CM | POA: Diagnosis not present

## 2019-10-17 DIAGNOSIS — E1169 Type 2 diabetes mellitus with other specified complication: Secondary | ICD-10-CM | POA: Diagnosis present

## 2019-10-17 DIAGNOSIS — Z7401 Bed confinement status: Secondary | ICD-10-CM | POA: Diagnosis not present

## 2019-10-17 DIAGNOSIS — I2510 Atherosclerotic heart disease of native coronary artery without angina pectoris: Secondary | ICD-10-CM | POA: Diagnosis present

## 2019-10-17 DIAGNOSIS — I10 Essential (primary) hypertension: Secondary | ICD-10-CM | POA: Diagnosis not present

## 2019-10-17 DIAGNOSIS — Z85828 Personal history of other malignant neoplasm of skin: Secondary | ICD-10-CM | POA: Diagnosis not present

## 2019-10-17 DIAGNOSIS — H9190 Unspecified hearing loss, unspecified ear: Secondary | ICD-10-CM | POA: Diagnosis present

## 2019-10-17 DIAGNOSIS — I480 Paroxysmal atrial fibrillation: Secondary | ICD-10-CM | POA: Diagnosis not present

## 2019-10-17 DIAGNOSIS — I110 Hypertensive heart disease with heart failure: Secondary | ICD-10-CM | POA: Diagnosis present

## 2019-10-17 DIAGNOSIS — J9621 Acute and chronic respiratory failure with hypoxia: Secondary | ICD-10-CM | POA: Diagnosis not present

## 2019-10-17 DIAGNOSIS — E1165 Type 2 diabetes mellitus with hyperglycemia: Secondary | ICD-10-CM | POA: Diagnosis not present

## 2019-10-17 DIAGNOSIS — I442 Atrioventricular block, complete: Secondary | ICD-10-CM | POA: Diagnosis not present

## 2019-10-17 DIAGNOSIS — E43 Unspecified severe protein-calorie malnutrition: Secondary | ICD-10-CM | POA: Diagnosis present

## 2019-10-17 DIAGNOSIS — J90 Pleural effusion, not elsewhere classified: Secondary | ICD-10-CM | POA: Diagnosis present

## 2019-10-17 DIAGNOSIS — J9601 Acute respiratory failure with hypoxia: Secondary | ICD-10-CM | POA: Diagnosis present

## 2019-10-17 DIAGNOSIS — Z7189 Other specified counseling: Secondary | ICD-10-CM | POA: Diagnosis not present

## 2019-10-17 DIAGNOSIS — J690 Pneumonitis due to inhalation of food and vomit: Principal | ICD-10-CM | POA: Diagnosis present

## 2019-10-17 DIAGNOSIS — R799 Abnormal finding of blood chemistry, unspecified: Secondary | ICD-10-CM | POA: Diagnosis present

## 2019-10-17 DIAGNOSIS — E785 Hyperlipidemia, unspecified: Secondary | ICD-10-CM | POA: Diagnosis present

## 2019-10-17 DIAGNOSIS — J1282 Pneumonia due to coronavirus disease 2019: Secondary | ICD-10-CM | POA: Diagnosis present

## 2019-10-17 DIAGNOSIS — Z681 Body mass index (BMI) 19 or less, adult: Secondary | ICD-10-CM | POA: Diagnosis not present

## 2019-10-17 DIAGNOSIS — R0902 Hypoxemia: Secondary | ICD-10-CM

## 2019-10-17 DIAGNOSIS — Z66 Do not resuscitate: Secondary | ICD-10-CM | POA: Diagnosis present

## 2019-10-17 DIAGNOSIS — R0602 Shortness of breath: Secondary | ICD-10-CM | POA: Diagnosis not present

## 2019-10-17 DIAGNOSIS — L8990 Pressure ulcer of unspecified site, unspecified stage: Secondary | ICD-10-CM | POA: Diagnosis present

## 2019-10-17 DIAGNOSIS — R531 Weakness: Secondary | ICD-10-CM | POA: Diagnosis not present

## 2019-10-17 DIAGNOSIS — R1310 Dysphagia, unspecified: Secondary | ICD-10-CM | POA: Diagnosis not present

## 2019-10-17 DIAGNOSIS — Z7984 Long term (current) use of oral hypoglycemic drugs: Secondary | ICD-10-CM | POA: Diagnosis not present

## 2019-10-17 DIAGNOSIS — T17908S Unspecified foreign body in respiratory tract, part unspecified causing other injury, sequela: Secondary | ICD-10-CM | POA: Diagnosis not present

## 2019-10-17 DIAGNOSIS — J1289 Other viral pneumonia: Secondary | ICD-10-CM | POA: Diagnosis not present

## 2019-10-17 DIAGNOSIS — J8489 Other specified interstitial pulmonary diseases: Secondary | ICD-10-CM | POA: Diagnosis not present

## 2019-10-17 DIAGNOSIS — T380X5A Adverse effect of glucocorticoids and synthetic analogues, initial encounter: Secondary | ICD-10-CM | POA: Diagnosis not present

## 2019-10-17 DIAGNOSIS — L89152 Pressure ulcer of sacral region, stage 2: Secondary | ICD-10-CM | POA: Diagnosis not present

## 2019-10-17 DIAGNOSIS — E119 Type 2 diabetes mellitus without complications: Secondary | ICD-10-CM

## 2019-10-17 DIAGNOSIS — Z8616 Personal history of COVID-19: Secondary | ICD-10-CM | POA: Diagnosis not present

## 2019-10-17 DIAGNOSIS — J80 Acute respiratory distress syndrome: Secondary | ICD-10-CM | POA: Diagnosis not present

## 2019-10-17 DIAGNOSIS — Z515 Encounter for palliative care: Secondary | ICD-10-CM | POA: Diagnosis not present

## 2019-10-17 DIAGNOSIS — I2699 Other pulmonary embolism without acute cor pulmonale: Secondary | ICD-10-CM | POA: Diagnosis present

## 2019-10-17 DIAGNOSIS — E876 Hypokalemia: Secondary | ICD-10-CM | POA: Diagnosis not present

## 2019-10-17 DIAGNOSIS — J189 Pneumonia, unspecified organism: Secondary | ICD-10-CM | POA: Diagnosis not present

## 2019-10-17 DIAGNOSIS — I361 Nonrheumatic tricuspid (valve) insufficiency: Secondary | ICD-10-CM | POA: Diagnosis not present

## 2019-10-17 DIAGNOSIS — B965 Pseudomonas (aeruginosa) (mallei) (pseudomallei) as the cause of diseases classified elsewhere: Secondary | ICD-10-CM | POA: Diagnosis present

## 2019-10-17 DIAGNOSIS — Z9889 Other specified postprocedural states: Secondary | ICD-10-CM

## 2019-10-17 DIAGNOSIS — I350 Nonrheumatic aortic (valve) stenosis: Secondary | ICD-10-CM | POA: Diagnosis not present

## 2019-10-17 DIAGNOSIS — M2550 Pain in unspecified joint: Secondary | ICD-10-CM | POA: Diagnosis not present

## 2019-10-17 DIAGNOSIS — N390 Urinary tract infection, site not specified: Secondary | ICD-10-CM | POA: Diagnosis present

## 2019-10-17 DIAGNOSIS — Z8679 Personal history of other diseases of the circulatory system: Secondary | ICD-10-CM

## 2019-10-17 DIAGNOSIS — I351 Nonrheumatic aortic (valve) insufficiency: Secondary | ICD-10-CM | POA: Diagnosis not present

## 2019-10-17 DIAGNOSIS — Z7901 Long term (current) use of anticoagulants: Secondary | ICD-10-CM

## 2019-10-17 DIAGNOSIS — I951 Orthostatic hypotension: Secondary | ICD-10-CM | POA: Diagnosis present

## 2019-10-17 DIAGNOSIS — Z79899 Other long term (current) drug therapy: Secondary | ICD-10-CM

## 2019-10-17 LAB — CBC WITH DIFFERENTIAL/PLATELET
Abs Immature Granulocytes: 0.04 10*3/uL (ref 0.00–0.07)
Basophils Absolute: 0 10*3/uL (ref 0.0–0.1)
Basophils Relative: 0 %
Eosinophils Absolute: 0 10*3/uL (ref 0.0–0.5)
Eosinophils Relative: 1 %
HCT: 47.7 % (ref 39.0–52.0)
Hemoglobin: 15.9 g/dL (ref 13.0–17.0)
Immature Granulocytes: 1 %
Lymphocytes Relative: 6 %
Lymphs Abs: 0.4 10*3/uL — ABNORMAL LOW (ref 0.7–4.0)
MCH: 31.8 pg (ref 26.0–34.0)
MCHC: 33.3 g/dL (ref 30.0–36.0)
MCV: 95.4 fL (ref 80.0–100.0)
Monocytes Absolute: 0.4 10*3/uL (ref 0.1–1.0)
Monocytes Relative: 5 %
Neutro Abs: 6.3 10*3/uL (ref 1.7–7.7)
Neutrophils Relative %: 87 %
Platelets: 150 10*3/uL (ref 150–400)
RBC: 5 MIL/uL (ref 4.22–5.81)
RDW: 15.3 % (ref 11.5–15.5)
WBC: 7.1 10*3/uL (ref 4.0–10.5)
nRBC: 0 % (ref 0.0–0.2)

## 2019-10-17 LAB — BASIC METABOLIC PANEL
Anion gap: 10 (ref 5–15)
BUN: 9 mg/dL (ref 8–23)
CO2: 22 mmol/L (ref 22–32)
Calcium: 8.3 mg/dL — ABNORMAL LOW (ref 8.9–10.3)
Chloride: 104 mmol/L (ref 98–111)
Creatinine, Ser: 0.51 mg/dL — ABNORMAL LOW (ref 0.61–1.24)
GFR calc Af Amer: >60 mL/min (ref >60)
GFR calc non Af Amer: >60 mL/min (ref >60)
Glucose, Bld: 145 mg/dL — ABNORMAL HIGH (ref 70–99)
Potassium: 3.8 mmol/L (ref 3.5–5.1)
Sodium: 136 mmol/L (ref 135–145)

## 2019-10-17 LAB — PHOSPHORUS: Phosphorus: 3.4 mg/dL (ref 2.5–4.6)

## 2019-10-17 LAB — BLOOD GAS, ARTERIAL
Acid-base deficit: 0.2 mmol/L (ref 0.0–2.0)
Bicarbonate: 22.9 mmol/L (ref 20.0–28.0)
Drawn by: 103701
FIO2: 60
O2 Saturation: 94.7 %
Patient temperature: 98.6
pCO2 arterial: 34.7 mmHg (ref 32.0–48.0)
pH, Arterial: 7.435 (ref 7.350–7.450)
pO2, Arterial: 75.6 mmHg — ABNORMAL LOW (ref 83.0–108.0)

## 2019-10-17 LAB — D-DIMER, QUANTITATIVE: D-Dimer, Quant: 0.74 ug/mL-FEU — ABNORMAL HIGH (ref 0.00–0.50)

## 2019-10-17 LAB — BODY FLUID CELL COUNT WITH DIFFERENTIAL
Eos, Fluid: 0 %
Lymphs, Fluid: 70 %
Monocyte-Macrophage-Serous Fluid: 18 % — ABNORMAL LOW (ref 50–90)
Neutrophil Count, Fluid: 12 % (ref 0–25)
Total Nucleated Cell Count, Fluid: 510 cu mm (ref 0–1000)

## 2019-10-17 LAB — LACTATE DEHYDROGENASE, PLEURAL OR PERITONEAL FLUID: LD, Fluid: 91 U/L — ABNORMAL HIGH (ref 3–23)

## 2019-10-17 LAB — GLUCOSE, CAPILLARY
Glucose-Capillary: 129 mg/dL — ABNORMAL HIGH (ref 70–99)
Glucose-Capillary: 203 mg/dL — ABNORMAL HIGH (ref 70–99)

## 2019-10-17 LAB — GLUCOSE, PLEURAL OR PERITONEAL FLUID: Glucose, Fluid: 171 mg/dL

## 2019-10-17 LAB — BRAIN NATRIURETIC PEPTIDE: B Natriuretic Peptide: 240.9 pg/mL — ABNORMAL HIGH (ref 0.0–100.0)

## 2019-10-17 LAB — MAGNESIUM: Magnesium: 1.6 mg/dL — ABNORMAL LOW (ref 1.7–2.4)

## 2019-10-17 LAB — PROTEIN, PLEURAL OR PERITONEAL FLUID: Total protein, fluid: <3 g/dL

## 2019-10-17 LAB — CBG MONITORING, ED: Glucose-Capillary: 159 mg/dL — ABNORMAL HIGH (ref 70–99)

## 2019-10-17 MED ORDER — INSULIN ASPART 100 UNIT/ML ~~LOC~~ SOLN
0.0000 [IU] | Freq: Three times a day (TID) | SUBCUTANEOUS | Status: DC
  Administered 2019-10-17: 5 [IU] via SUBCUTANEOUS
  Administered 2019-10-17 – 2019-10-18 (×2): 3 [IU] via SUBCUTANEOUS
  Administered 2019-10-18: 5 [IU] via SUBCUTANEOUS
  Administered 2019-10-18: 3 [IU] via SUBCUTANEOUS
  Administered 2019-10-19: 5 [IU] via SUBCUTANEOUS
  Administered 2019-10-19: 8 [IU] via SUBCUTANEOUS
  Administered 2019-10-19: 5 [IU] via SUBCUTANEOUS
  Administered 2019-10-20: 8 [IU] via SUBCUTANEOUS
  Administered 2019-10-20: 15 [IU] via SUBCUTANEOUS
  Filled 2019-10-17: qty 0.15

## 2019-10-17 MED ORDER — VITAMIN D 25 MCG (1000 UNIT) PO TABS
1000.0000 [IU] | ORAL_TABLET | Freq: Every day | ORAL | Status: DC
  Administered 2019-10-17 – 2019-10-25 (×9): 1000 [IU] via ORAL
  Filled 2019-10-17 (×9): qty 1

## 2019-10-17 MED ORDER — POTASSIUM CHLORIDE 20 MEQ/15ML (10%) PO SOLN
40.0000 meq | Freq: Two times a day (BID) | ORAL | Status: AC
  Administered 2019-10-17 (×2): 40 meq via ORAL
  Filled 2019-10-17 (×2): qty 30

## 2019-10-17 MED ORDER — CARBOXYMETHYLCELLULOSE SODIUM 0.5 % OP SOLN: 1.0000 [drp] | Freq: Every day | OPHTHALMIC | Status: DC | PRN

## 2019-10-17 MED ORDER — COLLAGENASE 250 UNIT/GM EX OINT
1.0000 "application " | TOPICAL_OINTMENT | Freq: Every day | CUTANEOUS | Status: DC
  Administered 2019-10-17 – 2019-10-25 (×8): 1 via TOPICAL
  Filled 2019-10-17 (×2): qty 30

## 2019-10-17 MED ORDER — SENNOSIDES-DOCUSATE SODIUM 8.6-50 MG PO TABS: 1.0000 | ORAL_TABLET | Freq: Every evening | ORAL | Status: DC | PRN

## 2019-10-17 MED ORDER — ALBUMIN HUMAN 25 % IV SOLN
25.0000 g | Freq: Once | INTRAVENOUS | Status: AC
  Administered 2019-10-17: 25 g via INTRAVENOUS
  Filled 2019-10-17: qty 100

## 2019-10-17 MED ORDER — SODIUM CHLORIDE 3 % IN NEBU
4.0000 mL | INHALATION_SOLUTION | Freq: Every day | RESPIRATORY_TRACT | Status: DC
  Filled 2019-10-17: qty 4

## 2019-10-17 MED ORDER — IOHEXOL 350 MG/ML SOLN
100.0000 mL | Freq: Once | INTRAVENOUS | Status: AC | PRN
  Administered 2019-10-17: 100 mL via INTRAVENOUS

## 2019-10-17 MED ORDER — CHLORHEXIDINE GLUCONATE 0.12 % MT SOLN
15.0000 mL | Freq: Two times a day (BID) | OROMUCOSAL | Status: DC
  Administered 2019-10-17 – 2019-10-25 (×16): 15 mL via OROMUCOSAL
  Filled 2019-10-17 (×13): qty 15

## 2019-10-17 MED ORDER — POLYVINYL ALCOHOL 1.4 % OP SOLN
1.0000 [drp] | OPHTHALMIC | Status: DC | PRN
  Administered 2019-10-17: 1 [drp] via OPHTHALMIC
  Filled 2019-10-17: qty 15

## 2019-10-17 MED ORDER — METOPROLOL TARTRATE 50 MG PO TABS
50.0000 mg | ORAL_TABLET | Freq: Two times a day (BID) | ORAL | Status: DC
  Administered 2019-10-17 – 2019-10-18 (×4): 50 mg via ORAL
  Administered 2019-10-19: 25 mg via ORAL
  Administered 2019-10-20 – 2019-10-25 (×8): 50 mg via ORAL
  Filled 2019-10-17: qty 2
  Filled 2019-10-17 (×4): qty 1
  Filled 2019-10-17: qty 2
  Filled 2019-10-17 (×2): qty 1
  Filled 2019-10-17 (×2): qty 2
  Filled 2019-10-17: qty 1
  Filled 2019-10-17: qty 2
  Filled 2019-10-17 (×5): qty 1

## 2019-10-17 MED ORDER — IPRATROPIUM-ALBUTEROL 0.5-2.5 (3) MG/3ML IN SOLN: 3.0000 mL | Freq: Four times a day (QID) | RESPIRATORY_TRACT | Status: DC | PRN

## 2019-10-17 MED ORDER — SODIUM CHLORIDE (PF) 0.9 % IJ SOLN
INTRAMUSCULAR | Status: AC
  Administered 2019-10-17: 10 mL
  Filled 2019-10-17: qty 50

## 2019-10-17 MED ORDER — ACETAMINOPHEN 325 MG PO TABS: 650.0000 mg | ORAL_TABLET | Freq: Four times a day (QID) | ORAL | Status: DC | PRN

## 2019-10-17 MED ORDER — SODIUM CHLORIDE 3 % IN NEBU
4.0000 mL | INHALATION_SOLUTION | Freq: Once | RESPIRATORY_TRACT | Status: DC
  Filled 2019-10-17: qty 4

## 2019-10-17 MED ORDER — METFORMIN HCL 500 MG PO TABS
500.0000 mg | ORAL_TABLET | Freq: Two times a day (BID) | ORAL | Status: DC
  Administered 2019-10-17 – 2019-10-18 (×3): 500 mg via ORAL
  Filled 2019-10-17 (×3): qty 1

## 2019-10-17 MED ORDER — FUROSEMIDE 10 MG/ML IJ SOLN
40.0000 mg | Freq: Three times a day (TID) | INTRAMUSCULAR | Status: DC
  Administered 2019-10-17 – 2019-10-18 (×3): 40 mg via INTRAVENOUS
  Filled 2019-10-17 (×3): qty 4

## 2019-10-17 MED ORDER — METHYLPREDNISOLONE SODIUM SUCC 125 MG IJ SOLR
INTRAMUSCULAR | Status: AC
  Filled 2019-10-17: qty 2

## 2019-10-17 MED ORDER — METHYLPREDNISOLONE SODIUM SUCC 125 MG IJ SOLR
60.0000 mg | Freq: Four times a day (QID) | INTRAMUSCULAR | Status: DC
  Administered 2019-10-17 – 2019-10-19 (×7): 60 mg via INTRAVENOUS
  Filled 2019-10-17 (×7): qty 2

## 2019-10-17 MED ORDER — ATORVASTATIN CALCIUM 20 MG PO TABS
20.0000 mg | ORAL_TABLET | Freq: Every day | ORAL | Status: DC
  Administered 2019-10-17 – 2019-10-24 (×8): 20 mg via ORAL
  Filled 2019-10-17: qty 2
  Filled 2019-10-17 (×2): qty 1
  Filled 2019-10-17: qty 2
  Filled 2019-10-17 (×4): qty 1

## 2019-10-17 MED ORDER — ORAL CARE MOUTH RINSE
15.0000 mL | Freq: Two times a day (BID) | OROMUCOSAL | Status: DC
  Administered 2019-10-17 – 2019-10-24 (×10): 15 mL via OROMUCOSAL

## 2019-10-17 MED ORDER — DICLOFENAC SODIUM 1 % EX GEL
1.0000 "application " | Freq: Every day | CUTANEOUS | Status: DC | PRN
  Filled 2019-10-17: qty 100

## 2019-10-17 MED ORDER — MAGNESIUM SULFATE 4 GM/100ML IV SOLN
4.0000 g | Freq: Once | INTRAVENOUS | Status: AC
  Administered 2019-10-17: 4 g via INTRAVENOUS
  Filled 2019-10-17: qty 100

## 2019-10-17 MED ORDER — OMEGA-3-ACID ETHYL ESTERS 1 G PO CAPS
1.0000 g | ORAL_CAPSULE | Freq: Two times a day (BID) | ORAL | Status: DC
  Administered 2019-10-17 – 2019-10-23 (×14): 1 g via ORAL
  Filled 2019-10-17 (×17): qty 1

## 2019-10-17 MED ORDER — APIXABAN 5 MG PO TABS
5.0000 mg | ORAL_TABLET | Freq: Two times a day (BID) | ORAL | Status: DC
  Administered 2019-10-17 – 2019-10-25 (×17): 5 mg via ORAL
  Filled 2019-10-17 (×18): qty 1

## 2019-10-17 MED ORDER — SODIUM CHLORIDE 0.9 % IV SOLN
2.0000 g | Freq: Two times a day (BID) | INTRAVENOUS | Status: DC
  Administered 2019-10-17 – 2019-10-22 (×11): 2 g via INTRAVENOUS
  Filled 2019-10-17 (×15): qty 2

## 2019-10-17 MED ORDER — RAMIPRIL 2.5 MG PO CAPS
2.5000 mg | ORAL_CAPSULE | Freq: Every day | ORAL | Status: DC
  Administered 2019-10-17: 2.5 mg via ORAL
  Filled 2019-10-17: qty 1

## 2019-10-17 MED ORDER — CHLORHEXIDINE GLUCONATE CLOTH 2 % EX PADS
6.0000 | MEDICATED_PAD | Freq: Every day | CUTANEOUS | Status: DC
  Administered 2019-10-17 – 2019-10-23 (×4): 6 via TOPICAL

## 2019-10-17 NOTE — ED Notes (Signed)
Patient taken of of nonrebreather placed by EMS and placed on nasal cannula at 4 liters. Patient oxygen saturation 93-94% on 4 liters. Patient does not endorse shortness of breath at this time.

## 2019-10-17 NOTE — Progress Notes (Signed)
Modified Barium Swallow Progress Note  Patient Details  Name: Robert Cuevas MRN: [id number] Date of Birth: 03/18/1930  Today's Date: 10/17/2019  Modified Barium Swallow completed.  Full report located under Chart Review in the Imaging Section.  Brief recommendations include the following:  Clinical Impression   Pt has a mild oropharyngeal dysphagia with silent aspiration of thin liquids that appears to be primarily related to timing issues and mild, generalized weakness. His oral phase is mildly prolonged, particularly his mastication even though he has his dentures in place for the study. There is mild oral/lingual residue after the swallow as well as mild-moderate residue across the base of tongue, valleculae, and pyriform sinuses. When pt takes a small, single sip of thin liquid he does well; however, he typically drinks with more impulsivity, and when he does, this ultimately results in silent aspiration. He can achieve adequate LVC, but when he already has pharyngeal residue present and his timing is minimally off, he does not get his laryngeal vestibule sealed in time. Penetration occurs with nectar thick liquids when consumed in the same fashion, but aspiration is not observed. Pt can clear thin and nectar thick liquids from his laryngeal vestibule well with a cued cough or throat clear. Recommend Dys 2 diet to facilitate oropharyngeal clearance and small, single cup sips of nectar thick liquids while pt's respiratory status is more compromised. Will f/u with pt to try to advance diet if strategies can be consistently implemented as his overall strength and respirations improve.    Swallow Evaluation Recommendations       SLP Diet Recommendations: Dysphagia 2 (Fine chop) solids;Nectar thick liquid   Liquid Administration via: Cup;No straw   Medication Administration: Crushed with puree   Supervision: Patient able to self feed;Full supervision/cueing for compensatory strategies   Compensations: Minimize environmental distractions;Slow rate;Small sips/bites;Other (Comment);Clear throat intermittently(ONE SIP at a time)   Postural Changes: Remain semi-upright after after feeds/meals (Comment);Seated upright at 90 degrees   Oral Care Recommendations: Oral care BID        10/17/2019,5:01 PM   Osie Bond., M.A. [place] Acute Environmental education officer [phone] Office [phone]

## 2019-10-17 NOTE — ED Notes (Addendum)
Patient repositioned in bed and call bell within reach. Denies any needs at this time.

## 2019-10-17 NOTE — ED Notes (Signed)
Patient transported to [facility] swallowing

## 2019-10-17 NOTE — ED Notes (Signed)
Pt was cleaned, provided new brief, new condom cath, applied, sheets changed, pt repositioned by RN & EMT.

## 2019-10-17 NOTE — ED Notes (Signed)
Respiratory called for ABG

## 2019-10-17 NOTE — ED Triage Notes (Addendum)
Patient coming from [facility] with complaints of low oxygen saturation. Oxygen saturation was 93% on 5 liters of oxygen. Patient wears 2-4 liters of oxygen at baseline. EMS placed patient on nonrebreather at 10 liters with an improvement to 95-98%.  PA at nursing facility wanted to send patient to hospital to be evaluated. Patient does not endorse shortness of breath at this time.

## 2019-10-17 NOTE — H&P (Signed)
History and Physical    DOA: 10/17/2019  PCP: [facility]  Patient coming from: SNF  Chief Complaint: worsening hypoxia and increased 02 requirements noted at SNF  HPI: Robert Cuevas is a 85 y.o. male with history h/o hypertension, hyperlipidemia paroxysmal atrial fibrillation on chronic Eliquis, complete heart block status post pacemaker, diabetes mellitus type 2 who presents to the emergency department from [facility] with hypoxia.  Patient was diagnosed with COVID-19 on 09/21/2019.  Was admitted to [facility] on 09/25/2019 -10/08/2019, treated with IV Remdisivir and Decadron, discharged on 1 L nasal cannula to SNF and again on 10/11/2019 -10/16/2019 with severe sepsis and hypoxia from aspiration pneumonia, also with UTI . Yesterday, patient was discharged with 2 L nasal cannula and instructions to f/u speech therapy- his nurse at [facility] reports he was satting 84% on 2 L nasal cannula.  Increased him to 5 L and his sats were onlly 88%.  Instructed by nurse practitioner to call EMS and sent here. Patient denies feeling short of breath.  He has no complaints at this time.  No chest pain.  States he was sent here because of low oxygen levels.   Review of Systems: As per HPI otherwise 10 point review of systems negative.    Past Medical History:  Diagnosis Date  . Cancer ([place])    skin cancer removed from head  . Complete heart block ([place])    a. s/p gen change 07/2011. b. Pacer pocket infx 02/2013 - s/p extraction, temp perm placement, then eventual implantation of new St. Jude pacemaker 03/04/13.  . Coronary artery calcification    Seen on CT 02/2013.  . High cholesterol   . HOH (hard of hearing)   . Hyperglycemia   . Hypertension   . Pacemaker- St Jude  03/06/2013   Extracted 5/14 Reimplant 5/14   . Paroxysmal atrial fibrillation ([place]) 01/04/2015    Past Surgical History:  Procedure Laterality Date  . GENERATOR REMOVAL N/A 03/02/2013   Procedure:  GENERATOR REMOVAL;  Surgeon: Evans Lance, MD;  Location: [place];  Service: Cardiovascular;  Laterality: N/A;  . ICD LEAD REMOVAL N/A 03/02/2013   Procedure: ICD LEAD REMOVAL;  Surgeon: Evans Lance, MD;  Location: [place];  Service: Cardiovascular;  Laterality: N/A;  . INGUINAL HERNIA REPAIR Bilateral   . PACEMAKER GENERATOR CHANGE N/A 09/19/2011   Procedure: PACEMAKER GENERATOR CHANGE;  Surgeon: Sanda Klein, MD;  Location: [place] CATH LAB;  Service: Cardiovascular;  Laterality: N/A;  . PACEMAKER INSERTION    . PACEMAKER LEAD REMOVAL N/A 03/02/2013   Procedure: PACEMAKER LEAD REMOVAL;  Surgeon: Evans Lance, MD;  Location: [place];  Service: Cardiovascular;  Laterality: N/A;  . PERMANENT PACEMAKER INSERTION N/A 03/04/2013   Procedure: PERMANENT PACEMAKER INSERTION;  Surgeon: Evans Lance, MD;  Location: [facility] CATH LAB;  Service: Cardiovascular;  Laterality: N/A;    Social history:  reports that he has never smoked. He has never used smokeless tobacco. He reports that he does not drink alcohol or use drugs.   No Known Allergies  Family h/o: Had 2 brothers, 2 sisters. States one brother was [place] war veteran like him. He died and used to take 20 pills --had DM. Denies family h/o heart doisease   Prior to Admission medications   Medication Sig Start Date End Date Taking? Authorizing Provider  acetaminophen (TYLENOL) 325 MG tablet Take 2 tablets (650 mg total) by mouth every 6 (six) hours as needed for mild pain (or  Fever >/= 101). 10/15/19   Annita Brod, MD  apixaban (ELIQUIS) 5 MG TABS tablet Take 1 tablet (5 mg total) by mouth 2 (two) times daily. 12/05/16   Croitoru, Mihai, MD  atorvastatin (LIPITOR) 20 MG tablet Take 1 tablet (20 mg total) by mouth daily. 03/03/18   Croitoru, Mihai, MD  carboxymethylcellulose (REFRESH PLUS) 0.5 % SOLN Place 1 drop into both eyes daily as needed (dry eyes).    [provider]  cholecalciferol (VITAMIN D3) 25 MCG (1000 UT) tablet Take 1,000  Units by mouth daily.    [provider]  collagenase (SANTYL) ointment Apply 1 application topically daily. Apply nickel thickness amount to sacrum wound daily    [provider]  diclofenac Sodium (VOLTAREN) 1 % GEL Apply 1 application topically daily as needed (knee pain).    [provider]  metFORMIN (GLUCOPHAGE) 500 MG tablet Take by mouth 2 (two) times daily with a meal.    [provider]  metoprolol (LOPRESSOR) 50 MG tablet Take 1 tablet (50 mg total) by mouth 2 (two) times daily. 03/07/13   Theora Gianotti, NP  Omega-3 Fatty Acids (FISH OIL) 1200 MG CAPS Take 2,400 mg by mouth daily.    [provider]  ramipril (ALTACE) 2.5 MG capsule Take 2.5 mg by mouth daily.  02/27/18   [provider]    Physical Exam: Vitals:   10/17/19 0630 10/17/19 0700 10/17/19 0715 10/17/19 0730  BP: (!) 116/51 121/64  (!) 125/59  Pulse: 70 70 71 70  Resp: 15 16 19 17   Temp:      TempSrc:      SpO2: 94% 91% 91% 90%  Weight:      Height:        Constitutional: No resp distress,calm, comfortable, hard of hearing Eyes: PERRL, lids and conjunctivae normal ENMT: Mucous membranes are moist. Posterior pharynx clear of any exudate or lesions.Normal dentition.  Neck: normal, supple, no masses, no thyromegaly Respiratory: On 10 lits O2 currently, clear to auscultation bilaterally (poor quality disposable steth used), no wheezing, no crackles. Normal respiratory effort. No accessory muscle use.  Cardiovascular: Regular rate and rhythm, no murmurs / rubs / gallops. No extremity edema. 2+ pedal pulses. No carotid bruits.  Abdomen: no tenderness, no masses palpated. No hepatosplenomegaly. Bowel sounds positive.  Musculoskeletal: no clubbing / cyanosis. No joint deformity upper and lower extremities. Good ROM, no contractures. Normal muscle tone.  Neurologic: CN 2-12 grossly intact. Sensation intact, DTR normal. Strength 5/5 in all 4.  Psychiatric:  Normal judgment and insight. Alert and oriented x 3. Normal mood.  SKIN/catheters: sacral decubitus with Allevyn dressing in place,no rashes,  Labs on Admission: I have personally reviewed following labs and imaging studies  CBC: Recent Labs  Lab 10/11/19 0623 10/12/19 0330 10/13/19 0330 10/14/19 0615 10/15/19 0430 10/16/19 0344 10/17/19 0421  WBC 23.9* 15.4* 10.1 8.1 6.4 5.9 7.1  NEUTROABS 21.3* 13.5*  --   --   --   --  6.3  HGB 17.0 15.3 14.4 15.9 15.4 15.3 15.9  HCT 50.8 46.3 43.0 47.3 45.8 45.9 47.7  MCV 94.8 95.9 94.3 94.2 93.9 94.3 95.4  PLT 114* 87* 102* 108* 127* 151 [id number]   Basic Metabolic Panel: Recent Labs  Lab 10/12/19 0330 10/13/19 0330 10/14/19 0615 10/15/19 0430 10/17/19 0421  NA 136 135 135 136 136  K 3.6 3.2* 4.5 4.4 3.8  CL 106 103 101 102 104  CO2 23 24 23 25  22  GLUCOSE 123* 144* 111* 103* 145*  BUN 20 13 12 11 9   CREATININE 0.57* 0.48* 0.56* 0.59* 0.51*  CALCIUM 8.2* 8.0* 8.2* 8.2* 8.3*  MG 1.6* 1.8  --   --   --   PHOS 2.9  --   --   --   --    GFR: Estimated Creatinine Clearance: 57.1 mL/min (A) (by C-G formula based on SCr of 0.51 mg/dL (L)). Liver Function Tests: Recent Labs  Lab 10/11/19 0623 10/12/19 0330  AST 41 29  ALT 64* 46*  ALKPHOS 126 124  BILITOT 3.2* 1.8*  PROT 5.6* 5.2*  ALBUMIN 3.1* 2.7*   No results for input(s): LIPASE, AMYLASE in the last 168 hours. No results for input(s): AMMONIA in the last 168 hours. Coagulation Profile: Recent Labs  Lab 10/11/19 0623  INR 1.4*   Cardiac Enzymes: No results for input(s): CKTOTAL, CKMB, CKMBINDEX, TROPONINI in the last 168 hours. BNP (last 3 results) No results for input(s): PROBNP in the last 8760 hours. HbA1C: No results for input(s): HGBA1C in the last 72 hours. CBG: Recent Labs  Lab 10/11/19 0634 10/11/19 1537 10/13/19 0854 10/13/19 1152 10/15/19 0732  GLUCAP 172* 129* 141* 143* 121*   Lipid Profile: No results for input(s): CHOL, HDL, LDLCALC, TRIG, CHOLHDL,  LDLDIRECT in the last 72 hours. Thyroid Function Tests: No results for input(s): TSH, T4TOTAL, FREET4, T3FREE, THYROIDAB in the last 72 hours. Anemia Panel: No results for input(s): VITAMINB12, FOLATE, FERRITIN, TIBC, IRON, RETICCTPCT in the last 72 hours. Urine analysis:    Component Value Date/Time   COLORURINE BROWN (A) 10/11/2019 0642   APPEARANCEUR TURBID (A) 10/11/2019 0642   LABSPEC 1.025 10/11/2019 0642   PHURINE 6.5 10/11/2019 0642   GLUCOSEU 100 (A) 10/11/2019 0642   HGBUR LARGE (A) 10/11/2019 0642   BILIRUBINUR LARGE (A) 10/11/2019 0642   KETONESUR 15 (A) 10/11/2019 0642   PROTEINUR >300 (A) 10/11/2019 0642   NITRITE POSITIVE (A) 10/11/2019 0642   LEUKOCYTESUR MODERATE (A) 10/11/2019 0642    Radiological Exams on Admission: Personally reviewed  DG Chest Portable 1 View  Result Date: 10/17/2019 CLINICAL DATA:  Shortness of breath with increasing O2 requirement EXAM: PORTABLE CHEST 1 VIEW COMPARISON:  Five days ago FINDINGS: Enlarged heart. Bilateral airspace disease more dense on the right where there is also some volume loss. Cardiomegaly. Unremarkable dual-chamber pacer positioning. IMPRESSION: Stable pulmonary infiltrates asymmetric to the right. Electronically Signed   By: Monte Fantasia M.D.   On: 10/17/2019 05:28    EKG: Independently reviewed. Paced rhythm, no new changes compared to prior EKG. Qtc prolonged at 523 msec     Assessment and Plan:   Principal Problem:   Acute on chronic respiratory failure with hypoxia (HCC) Active Problems:   Aspiration pneumonia (HCC)   Pacemaker- St Jude    Dysphagia   Paroxysmal atrial fibrillation (HCC)   Long term (current) use of anticoagulants   COVID-19 virus infection   Diabetes mellitus type II, non insulin dependent ([place])    1. Acute hypoxic respiratory failure: Just discharged from [facility] yesterday.According to d/c summary 12/27, patient last time had severe sepsis with white blood cell count of 23.9/  hypotension with systolic blood pressure in the 80s and hypoxia initially requiring nonrebreather mask and 15 L high flow nasal cannula.Seen by speech therapy and placed on dysphagia 1 diet with thin liquids. By time of discharge, completed five full days of IV antibiotics.White blood cell count normalized by 12/25.By 12/26, O2 at 96% on  2 liters--was discharged with instructions to wean at SNF and f/u speech therapy there. Patient however returns with drastic incraese in 02 requirements upto 10 lits to keep sats >90%--differentials include post COVID fibrosis vs recurrent aspiration vs fluid overload from recent aggressive hydration. Since patient on anticoagulation, PE would be unlikley.Afebrile, normotensive today with CXR showing rt >left basilar airspace disease with cardiomegaly. Will admit with steroids and obtain high resoltion CTA chest, echo ( BNP 274 on 12/26). Consult pulmonary/Speech. Hold off on antibiotics for now. Titrate 02 as tolerated.    2.Paroxysmal atrial fibrillation/Complete heart block-status post pacemaker.We will resume Eliquis.  3. Recent COVID-19 virus infection: Will defer repeat testing as last tested positive within 90 days. Since its been >21 days since initial diagnosis on 09/21/19, not sure if still needs to be on precautions. Will d/w Pulmonary.   4. Pressure injury of skin-Diabetes mellitus type 2, uncontrolled, with complications ([place]): Sliding scale only. CBG stable  5. Recent Urinary tract infection: Urine growing out Pseudomonas and Enterococcus,near pansensitive. Discussed with pharmacy and antibiotics narrowed to just Zosyn on 12/25. Completed five full days of IV antibiotics by discharge.  6. HTN: Normotensive currently. Resume metoprolol/ramipril  7. Hyperlipidemia: Resume statins  8. Diabetes mellitus: On metformin as outpatient. SSI while here  9. Prolonged QTc: Since paced rhythm, true Qtc likely around 565ms. Avoid qt prolonging agents. Keep  potassium close to 4 and magnesium close to 2.  10. Stage II-III sacral decubitus wound: Present on admission. Secondary to limited ability to ambulate. Continued local wound care with Santyl. No evidence of acute new infection.   DVT prophylaxis: on eliquis  COVID screen: deferred as positive within 90 days  Code Status:  DNR  .Health care proxy would be   Patient/Family Communication: Discussed with patient and all questions answered to satisfaction.  Consults called: Pulmonary Admission status : I certify that at the point of admission it is my clinical judgment that the patient will require inpatient hospital care spanning beyond 2 midnights from the point of admission due to high intensity of service and high frequency of surveillance required.Inpatient status is judged to be reasonable and necessary in order to provide the required intensity of service to ensure the patient's safety. The patient's presenting symptoms, physical exam findings, and initial radiographic and laboratory data in the context of their chronic comorbidities is felt to place them at high risk for further clinical deterioration. The following factors support the patient status of inpatient : New hypoxia requiring highflow 02 Expected LOS:  2-3 days    Guilford Shi MD [facility] Pager [phone]  If 7PM-7AM, please contact night-coverage [email or web address] Password [facility]  10/17/2019, 8:13 AM

## 2019-10-17 NOTE — ED Notes (Addendum)
Patient repositioned in bed and given meal tray.

## 2019-10-17 NOTE — ED Notes (Signed)
Updated patient's daughter on plan of care.

## 2019-10-17 NOTE — ED Provider Notes (Addendum)
TIME SEEN: 4:04 AM  CHIEF COMPLAINT: Hypoxia  HPI: Patient is an 85 year old male with history of complete heart block status post pacemaker, hypertension, hyperlipidemia who presents to the emergency department from [facility] with hypoxia.  Patient was diagnosed with COVID-19 on 09/21/2019.  Was admitted to [facility] on 09/25/2019 -10/08/2019 and again on 10/11/2019 -10/16/2019.  On discharge on the 27th patient was discharged with 2 L nasal cannula.  Tonight his nurse at [facility] [place] reports he was satting 84% on 2 L nasal cannula.  Increased him to 5 L and his sats were 88%.  Instructed by nurse practitioner to call EMS.  Patient denies feeling short of breath.  He has no complaints at this time.  No chest pain.  States he was sent here because of low oxygen levels.  ROS: See HPI Constitutional: no fever  Eyes: no drainage  ENT: no runny nose   Cardiovascular:  no chest pain  Resp: no SOB  GI: no vomiting GU: no dysuria Integumentary: no rash  Allergy: no hives  Musculoskeletal: no leg swelling  Neurological: no slurred speech ROS otherwise negative  PAST MEDICAL HISTORY/PAST SURGICAL HISTORY:  Past Medical History:  Diagnosis Date  . Cancer ([place])    skin cancer removed from head  . Complete heart block ([place])    a. s/p gen change 07/2011. b. Pacer pocket infx 02/2013 - s/p extraction, temp perm placement, then eventual implantation of new St. Jude pacemaker 03/04/13.  . Coronary artery calcification    Seen on CT 02/2013.  . High cholesterol   . HOH (hard of hearing)   . Hyperglycemia   . Hypertension   . Pacemaker- St Jude  03/06/2013   Extracted 5/14 Reimplant 5/14   . Paroxysmal atrial fibrillation ([place]) 01/04/2015    MEDICATIONS:  Prior to Admission medications   Medication Sig Start Date End Date Taking? Authorizing Provider  acetaminophen (TYLENOL) 325 MG tablet Take 2 tablets (650 mg total) by mouth every 6 (six) hours as needed for mild pain (or  Fever >/= 101). 10/15/19   Annita Brod, MD  apixaban (ELIQUIS) 5 MG TABS tablet Take 1 tablet (5 mg total) by mouth 2 (two) times daily. 12/05/16   Croitoru, Mihai, MD  atorvastatin (LIPITOR) 20 MG tablet Take 1 tablet (20 mg total) by mouth daily. 03/03/18   Croitoru, Mihai, MD  carboxymethylcellulose (REFRESH PLUS) 0.5 % SOLN Cuevas 1 drop into both eyes daily as needed (dry eyes).    [provider]  cholecalciferol (VITAMIN D3) 25 MCG (1000 UT) tablet Take 1,000 Units by mouth daily.    [provider]  collagenase (SANTYL) ointment Apply 1 application topically daily. Apply nickel thickness amount to sacrum wound daily    [provider]  diclofenac Sodium (VOLTAREN) 1 % GEL Apply 1 application topically daily as needed (knee pain).    [provider]  metFORMIN (GLUCOPHAGE) 500 MG tablet Take by mouth 2 (two) times daily with a meal.    [provider]  metoprolol (LOPRESSOR) 50 MG tablet Take 1 tablet (50 mg total) by mouth 2 (two) times daily. 03/07/13   Theora Gianotti, NP  Omega-3 Fatty Acids (FISH OIL) 1200 MG CAPS Take 2,400 mg by mouth daily.    [provider]  ramipril (ALTACE) 2.5 MG capsule Take 2.5 mg by mouth daily.  02/27/18   [provider]    ALLERGIES:  No Known Allergies  SOCIAL HISTORY:  Social History  Tobacco Use  . Smoking status: Never Smoker  . Smokeless tobacco: Never Used  Substance Use Topics  . Alcohol use: No    FAMILY HISTORY: No family history on file.  EXAM: BP (!) 128/56 (BP Location: Left Arm)   Pulse 70   Temp 97.6 F (36.4 C) (Oral)   Resp 19   Ht 5\' 8"  (1.727 m)   Wt 62.1 kg   SpO2 93%   BMI 20.83 kg/m  CONSTITUTIONAL: Alert and oriented and responds appropriately to questions.  Elderly, thin HEAD: Normocephalic EYES: Conjunctivae clear, pupils appear equal, EOM appear intact ENT: normal nose; moist mucous membranes NECK: Supple, normal ROM CARD: RRR; S1  and S2 appreciated; no murmurs, no clicks, no rubs, no gallops RESP: Normal chest excursion without splinting or tachypnea; breath sounds clear and equal bilaterally; no wheezes, no rhonchi, no rales, no hypoxia on 5 L nasal cannula or respiratory distress, speaking full sentences ABD/GI: Normal bowel sounds; non-distended; soft, non-tender, no rebound, no guarding, no peritoneal signs, no hepatosplenomegaly BUTTOCK: 2 x 2 centimeter stage II sacral decubitus ulcer with minimal surrounding erythema no warmth or drainage BACK:  The back appears normal EXT: Normal ROM in all joints; no deformity noted, no edema; no cyanosis SKIN: Normal color for age and race; warm; no rash on exposed skin NEURO: Moves all extremities equally PSYCH: The patient's mood and manner are appropriate.   MEDICAL DECISION MAKING: Patient here with increasing oxygen requirement at his nursing facility.  He has no complaints currently.  Lungs are clear without respiratory distress.  Sats of 93% on 5 L currently.  Will repeat labs, chest x-ray.  EKG shows paced rhythm.    ED PROGRESS: Patient sats dropped to 86% on 4 L nasal cannula.  He is sitting upright and awake in bed.  Put back on 10 L nasal cannula and his sats are 90%.   Patient's labs unremarkable.  Chest x-ray appears stable.  Patient currently on 10 L nasal cannula to keep his sats in the low 90s.  He is not in distress.  Will discuss with hospitalist.  I do not feel Robert Cuevas will be comfortable excepting the patient back on 10 L nasal cannula.  Sats currently 90 to 91% on 10 L nasal cannula.  6:40 AM  Spoke with Dr. Maudie Mercury with hospitalist service.  I am requesting a consult from the hospitalist team.  Patient is in no respiratory distress, has no increased work of breathing, smiling and laughing, in no distress and has no complaints.  He is however requiring more oxygen than he was discharged with on the 27th.  He has Covid pneumonia on his chest x-ray that  appears stable.  I called Jasmine, nurse at [facility] who states that she felt uncomfortable taking patient back on 10 L nasal cannula.  She reports some of their concentrators are able to administer 5 L, 8 L and 15 L.  She states normally when patient is on 10 L, she becomes very uncomfortable.  They do not have a policy at [facility] that she is aware of regarding capabilities for oxygen administration to their patients.  I do not think the patient has a pulmonary embolus today but will add on d dimer given he has had two recent hospitalizations.  He is not in any respiratory distress.  He has no tachycardia or tachypnea.  He is not complaining of chest pain or shortness of breath.  I suspect that this is now going  to be patient's line from his Covid pneumonia.  Hospitalist agrees to see patient in consultation this morning.  Have also placed case management consult for help with disposition.   7:10 AM  Pt's age-adjusted D-dimer is negative.  Signed out to oncoming ED physician to follow-up on hospitalist and case management recommendations.  Patient unable to be weaned down from 10 L nasal cannula but again is in no distress.    I reviewed all nursing notes and pertinent previous records as available.  I have interpreted any EKGs, lab and urine results, imaging (as available).    EKG Interpretation  Date/Time:  Monday October 17 2019 04:19:54 EST Ventricular Rate:  70 PR Interval:    QRS Duration: 182 QT Interval:  484 QTC Calculation: 523 R Axis:   -108 Text Interpretation: AV dual-paced rhythm No significant change since last tracing Confirmed by Pryor Curia [phone]) on 10/17/2019 4:38:06 AM        CRITICAL CARE Performed by: Cyril Mourning Javonda Suh   Total critical care time: 45 minutes  Critical care time was exclusive of separately billable procedures and treating other patients.  Critical care was necessary to treat or prevent imminent or life-threatening  deterioration.  Critical care was time spent personally by me on the following activities: development of treatment plan with patient and/or surrogate as well as nursing, discussions with consultants, evaluation of patient's response to treatment, examination of patient, obtaining history from patient or surrogate, ordering and performing treatments and interventions, ordering and review of laboratory studies, ordering and review of radiographic studies, pulse oximetry and re-evaluation of patient's condition.   Robert Cuevas was evaluated in Emergency Department on 10/17/2019 for the symptoms described in the history of present illness. He was evaluated in the context of the global COVID-19 pandemic, which necessitated consideration that the patient might be at risk for infection with the SARS-CoV-2 virus that causes COVID-19. Institutional protocols and algorithms that pertain to the evaluation of patients at risk for COVID-19 are in a state of rapid change based on information released by regulatory bodies including the CDC and federal and state organizations. These policies and algorithms were followed during the patient's care in the ED.  Patient was seen wearing N95, face shield, gloves, gown.        Tahliyah Anagnos, Delice Bison, DO 10/17/19 0645    Raedyn Wenke, Delice Bison, DO 10/17/19 [id number]

## 2019-10-17 NOTE — Progress Notes (Signed)
Provided RN with [facility].

## 2019-10-17 NOTE — Procedures (Signed)
Thora w/ [place] Note Timeout performed Right chest examined with [place] and skin overlying fluid pocket marked Area prepped and anesthesized with 1% lidocaine 700  cc amber  fluid removed Bandaid applied to site CXR pending No immediate complications

## 2019-10-17 NOTE — Progress Notes (Signed)
SLP Note:  Order received for BSE and MBS. Pt is known to this SLP from recent hospitalization at [facility]. At the time he showed no clinical signs of aspiration, but preferred to be on a pureed diet as he did not have his dentures accessible.   Given that he has no overt signs of dysphagia at bedside but there continues to be concern for aspiration, will proceed directly with MBS. Will complete this afternoon with radiology.    Robert Cuevas., M.A. [place] Acute Environmental education officer [phone] Office [phone]

## 2019-10-17 NOTE — Progress Notes (Signed)
ED RT spoke with MD Tamala Julian (CCM) concerning ordered Hypertonic neb for sputum induction- per CCM MD can be completed 10/18/19 morning (PT is very dry at this time).

## 2019-10-17 NOTE — Plan of Care (Signed)
85 yo male with hypertension, hyperlipidemia, CAD, Pafib, recent admission for Covid -19 pneumonia has increased o2 requirement 2L-> 10 L  SNF not willing to take back to SNF on increase o2 requirement

## 2019-10-17 NOTE — ED Notes (Signed)
Patient's oxygen saturation dropped to 86% on 5 liters of O2. Deep breathing encouraged. Per MD, O2 increased to 10 liters. Patient does not endorse shortness of breath and does not appear to be in distress at this time.

## 2019-10-17 NOTE — ED Notes (Signed)
Respiratory at bedside.

## 2019-10-17 NOTE — Consult Note (Signed)
NAME:  Robert Cuevas, MRN:  [id number], DOB:  [birth year], LOS: 0 ADMISSION DATE:  10/17/2019, CONSULTATION DATE:  10/17/19 REFERRING MD:  Earnest Conroy, CHIEF COMPLAINT:  [place]   Brief History   85 year old man with history of afib on AC, HTN, HLD, pacemaker-dependence presenting with recurrent hypoxemic respiratory failure after having COVID in early December.  History of present illness   85 year old man with history of afib on AC, HTN, HLD, pacemaker-dependence presenting with recurrent hypoxemic respiratory failure after having COVID in early December.  Tested positive on December 2.  History as below, confirmed with patient:  " Was admitted to [facility] on 09/25/2019 -10/08/2019, treated with IV Remdisivir and Decadron, discharged on 1 L nasal cannula to SNF and again on 10/11/2019-10/16/2019 with severe sepsis and hypoxia from aspiration pneumonia, also with UTI . Yesterday, patient was discharged with 2 L nasal cannula and instructions to f/u speech therapy- his nurse at [facility] reports he was satting 84% on 2 L nasal cannula. Increased him to 5 L and his sats were onlly 88%. Instructed by nurse practitioner to call EMS and sent here. Patient denies feeling short of breath. He has no complaints at this time. No chest pain. States he was sent here because of low oxygen levels.'  Denies chest pain, productive cough, fevers, chills, orthopnea. Seen by speech last admission rec'd for puree with thin liquids.  Past Medical History   Past Medical History:  Diagnosis Date  . Cancer ([place])    skin cancer removed from head  . Complete heart block ([place])    a. s/p gen change 07/2011. b. Pacer pocket infx 02/2013 - s/p extraction, temp perm placement, then eventual implantation of new St. Jude pacemaker 03/04/13.  . Coronary artery calcification    Seen on CT 02/2013.  . High cholesterol   . HOH (hard of hearing)   . Hyperglycemia   . Hypertension   . Pacemaker- St Jude   03/06/2013   Extracted 5/14 Reimplant 5/14   . Paroxysmal atrial fibrillation ([place]) 01/04/2015     Significant Hospital Events   12/28 admitted again  Consults:  PCCM  Procedures:  12/28 R thoracentesis 700cc clear fluid  Significant Diagnostic Tests:  CTA chest- dense right sided infiltrate, read as small nonocclusive segmental PE but I cannot find this.  Bilateral R>L effusions.  Micro Data:  6 days ago urine growing enterococcus and pseudomonas both pansensitive  Antimicrobials:  12/28 Cefepime >>   Interim history/subjective:  Consulted  Objective   Blood pressure (!) 91/48, pulse 71, temperature 97.6 F (36.4 C), temperature source Oral, resp. rate 17, height 5\' 8"  (1.727 m), weight 62.1 kg, SpO2 93 %.       No intake or output data in the 24 hours ending 10/17/19 1458 Filed Weights   10/17/19 0304  Weight: 62.1 kg    Examination: General: Cachetic elderly man sitting in bed HENT: MMM, no thrush Lungs: Diminished bases, rhonci on R, moderate R effusion Cardiovascular: RRR, loud P2, ext warm Abdomen: Soft, +BS Extremities: 2+ pitting edema Neuro: Moves all 4 ext to command GU: Condom catheter in place  Resolved Hospital Problem list   N/A  Assessment & Plan:  # Acute hypoxemic respiratory failure after COVID- differential includes persistent organizing pneumonia, HCAP, volume overload. Pct have persistently been negative but in setting of recurrent steroid use not sure what to do with this.  He did have pseudomonas in urine last week.  No  tracheal aspirates able to be collected. # Dysphagia- clinical evaluation without frank aspiration, given recurrences I think MBSS is warranted to confirm # Frail elderly- now with pressure ulcers, inability to transfer, poor PO - > 21 days from + covid test, does not need airborne isolation - Steroids, diuretics, abx for now, check Pct, induced sputum culture - Therapeutic/diagnostic drain of R chest, send for usual  studies - Monitor BMET, strict I/O - MBSS - Encourage IS - HTS x 1 with sputum culture - Hold ACE-I - OOB to chair as able - Palliative care consult, if does not turn around from above measures would recommend hospice   Review of Systems:    Positive Symptoms in bold:  Constitutional fevers, chills, weight loss, fatigue, anorexia, malaise  Eyes decreased vision, double vision, eye irritation  Ears, Nose, Mouth, Throat sore throat, trouble swallowing, sinus congestion  Cardiovascular chest pain, paroxysmal nocturnal dyspnea, lower ext edema, palpitations   Respiratory SOB, cough, DOE, hemoptysis, wheezing  Gastrointestinal nausea, vomiting, diarrhea  Genitourinary burning with urination, trouble urinating  Musculoskeletal joint aches, joint swelling, back pain  Integumentary  rashes, skin lesions  Neurological focal weakness, focal numbness, trouble speaking, headaches  Psychiatric depression, anxiety, confusion  Endocrine polyuria, polydipsia, cold intolerance, heat intolerance  Hematologic abnormal bruising, abnormal bleeding, unexplained nose bleeds  Allergic/Immunologic recurrent infections, hives, swollen lymph nodes     Past Medical History  He,  has a past medical history of Cancer ([place]), Complete heart block ([place]), Coronary artery calcification, High cholesterol, HOH (hard of hearing), Hyperglycemia, Hypertension, Pacemaker- St Jude  (03/06/2013), and Paroxysmal atrial fibrillation ([place]) (01/04/2015).   Surgical History    Past Surgical History:  Procedure Laterality Date  . GENERATOR REMOVAL N/A 03/02/2013   Procedure: GENERATOR REMOVAL;  Surgeon: Evans Lance, MD;  Location: [facility];  Service: Cardiovascular;  Laterality: N/A;  . ICD LEAD REMOVAL N/A 03/02/2013   Procedure: ICD LEAD REMOVAL;  Surgeon: Evans Lance, MD;  Location: [facility];  Service: Cardiovascular;  Laterality: N/A;  . INGUINAL HERNIA REPAIR Bilateral   . PACEMAKER GENERATOR CHANGE N/A 09/19/2011    Procedure: PACEMAKER GENERATOR CHANGE;  Surgeon: Sanda Klein, MD;  Location: [facility] CATH LAB;  Service: Cardiovascular;  Laterality: N/A;  . PACEMAKER INSERTION    . PACEMAKER LEAD REMOVAL N/A 03/02/2013   Procedure: PACEMAKER LEAD REMOVAL;  Surgeon: Evans Lance, MD;  Location: [facility];  Service: Cardiovascular;  Laterality: N/A;  . PERMANENT PACEMAKER INSERTION N/A 03/04/2013   Procedure: PERMANENT PACEMAKER INSERTION;  Surgeon: Evans Lance, MD;  Location: [facility] CATH LAB;  Service: Cardiovascular;  Laterality: N/A;     Social History   reports that he has never smoked. He has never used smokeless tobacco. He reports that he does not drink alcohol or use drugs.   Family History   His family history is not on file.   Allergies No Known Allergies   Home Medications  Prior to Admission medications   Medication Sig Start Date End Date Taking? Authorizing Provider  acetaminophen (TYLENOL) 325 MG tablet Take 2 tablets (650 mg total) by mouth every 6 (six) hours as needed for mild pain (or Fever >/= 101). 10/15/19  Yes Annita Brod, MD  apixaban (ELIQUIS) 5 MG TABS tablet Take 1 tablet (5 mg total) by mouth 2 (two) times daily. 12/05/16  Yes Croitoru, Mihai, MD  atorvastatin (LIPITOR) 20 MG tablet Take 1 tablet (20 mg total) by mouth daily. 03/03/18  Yes Croitoru, Dani Gobble, MD  carboxymethylcellulose (REFRESH PLUS) 0.5 % SOLN Place 1 drop into both eyes daily as needed (dry eyes).   Yes [provider]  cholecalciferol (VITAMIN D3) 25 MCG (1000 UT) tablet Take 1,000 Units by mouth daily.   Yes [provider]  collagenase (SANTYL) ointment Apply 1 application topically daily. Apply nickel thickness amount to sacrum wound daily   Yes [provider]  diclofenac Sodium (VOLTAREN) 1 % GEL Apply 1 application topically daily as needed (knee pain).   Yes [provider]  metFORMIN (GLUCOPHAGE) 500 MG tablet Take 500 mg by mouth 2 (two) times daily with a meal.    Yes  [provider]  metoprolol (LOPRESSOR) 50 MG tablet Take 1 tablet (50 mg total) by mouth 2 (two) times daily. 03/07/13  Yes Theora Gianotti, NP  Omega-3 Fatty Acids (FISH OIL) 1200 MG CAPS Take 2,400 mg by mouth daily.   Yes [provider]  predniSONE (DELTASONE) 20 MG tablet Take 20 mg by mouth daily with breakfast.   Yes [provider]  ramipril (ALTACE) 2.5 MG capsule Take 2.5 mg by mouth daily.  02/27/18  Yes [provider]  albuterol (VENTOLIN HFA) 108 (90 Base) MCG/ACT inhaler Inhale 2 puffs into the lungs every 6 (six) hours as needed for wheezing or shortness of breath.    [provider]

## 2019-10-17 NOTE — ED Provider Notes (Signed)
Patient signout from Dr. Leonides Schanz.  85 year old male resident of [facility] with recent diagnosis of Covid.  Just discharged back to facility yesterday on 2 L nasal cannula.  Overnight sats dropped and he is currently on 10 L. Physical Exam  BP 121/64 (BP Location: Left Arm)   Pulse 70   Temp 97.6 F (36.4 C) (Oral)   Resp 16   Ht 5\' 8"  (1.727 m)   Wt 62.1 kg   SpO2 91%   BMI 20.83 kg/m   Physical Exam  ED Course/Procedures     Procedures  MDM  Hospitalist and case management both aware of patient.  Plan is either to return to his facility on increased oxygen or will need to be admitted.       Hayden Rasmussen, MD 10/17/19 [phone]

## 2019-10-17 NOTE — ED Notes (Addendum)
Per Dr Tamala Julian, pt should not be on airborne/contact precautions for COVID. Dr Tamala Julian states that since the test was positive 12/2, 3 weeks ago, the pt is no longer COVID+.

## 2019-10-18 ENCOUNTER — Inpatient Hospital Stay (HOSPITAL_COMMUNITY): Payer: No Typology Code available for payment source

## 2019-10-18 DIAGNOSIS — I361 Nonrheumatic tricuspid (valve) insufficiency: Secondary | ICD-10-CM

## 2019-10-18 DIAGNOSIS — J690 Pneumonitis due to inhalation of food and vomit: Principal | ICD-10-CM

## 2019-10-18 DIAGNOSIS — J9621 Acute and chronic respiratory failure with hypoxia: Secondary | ICD-10-CM

## 2019-10-18 DIAGNOSIS — E43 Unspecified severe protein-calorie malnutrition: Secondary | ICD-10-CM

## 2019-10-18 DIAGNOSIS — I351 Nonrheumatic aortic (valve) insufficiency: Secondary | ICD-10-CM

## 2019-10-18 DIAGNOSIS — U071 COVID-19: Secondary | ICD-10-CM

## 2019-10-18 DIAGNOSIS — I350 Nonrheumatic aortic (valve) stenosis: Secondary | ICD-10-CM

## 2019-10-18 LAB — PH, BODY FLUID: pH, Body Fluid: 7.6

## 2019-10-18 LAB — CBC
HCT: 47.6 % (ref 39.0–52.0)
Hemoglobin: 15.9 g/dL (ref 13.0–17.0)
MCH: 31.5 pg (ref 26.0–34.0)
MCHC: 33.4 g/dL (ref 30.0–36.0)
MCV: 94.3 fL (ref 80.0–100.0)
Platelets: 176 10*3/uL (ref 150–400)
RBC: 5.05 MIL/uL (ref 4.22–5.81)
RDW: 15.3 % (ref 11.5–15.5)
WBC: 6.7 10*3/uL (ref 4.0–10.5)
nRBC: 0 % (ref 0.0–0.2)

## 2019-10-18 LAB — BASIC METABOLIC PANEL
Anion gap: 14 (ref 5–15)
BUN: 10 mg/dL (ref 8–23)
CO2: 25 mmol/L (ref 22–32)
Calcium: 8.7 mg/dL — ABNORMAL LOW (ref 8.9–10.3)
Chloride: 100 mmol/L (ref 98–111)
Creatinine, Ser: 0.49 mg/dL — ABNORMAL LOW (ref 0.61–1.24)
GFR calc Af Amer: >60 mL/min (ref >60)
GFR calc non Af Amer: >60 mL/min (ref >60)
Glucose, Bld: 209 mg/dL — ABNORMAL HIGH (ref 70–99)
Potassium: 3.6 mmol/L (ref 3.5–5.1)
Sodium: 139 mmol/L (ref 135–145)

## 2019-10-18 LAB — GLUCOSE, CAPILLARY
Glucose-Capillary: 219 mg/dL — ABNORMAL HIGH (ref 70–99)
Glucose-Capillary: 187 mg/dL — ABNORMAL HIGH (ref 70–99)
Glucose-Capillary: 199 mg/dL — ABNORMAL HIGH (ref 70–99)
Glucose-Capillary: 221 mg/dL — ABNORMAL HIGH (ref 70–99)
Glucose-Capillary: 198 mg/dL — ABNORMAL HIGH (ref 70–99)

## 2019-10-18 LAB — ECHOCARDIOGRAM COMPLETE
Height: 68 in
Weight: 2017.65 oz

## 2019-10-18 LAB — PATHOLOGIST SMEAR REVIEW

## 2019-10-18 LAB — PROCALCITONIN: Procalcitonin: <0.1 ng/mL

## 2019-10-18 LAB — MAGNESIUM: Magnesium: 1.8 mg/dL (ref 1.7–2.4)

## 2019-10-18 MED ORDER — RESOURCE THICKENUP CLEAR PO POWD
ORAL | Status: DC | PRN
  Filled 2019-10-18: qty 125

## 2019-10-18 MED ORDER — FUROSEMIDE 10 MG/ML IJ SOLN
40.0000 mg | Freq: Two times a day (BID) | INTRAMUSCULAR | Status: DC
  Administered 2019-10-18 – 2019-10-21 (×7): 40 mg via INTRAVENOUS
  Filled 2019-10-18 (×8): qty 4

## 2019-10-18 NOTE — Progress Notes (Signed)
NAME:  Robert Cuevas, MRN:  [id number], DOB:  1929/12/16, LOS: 1 ADMISSION DATE:  10/17/2019, CONSULTATION DATE:  12/28 REFERRING MD:  Terrilee Croak, MD, CHIEF COMPLAINT:  Hypoxemic respiratory failure  Brief History   85 year old man with history of afib on AC, HTN, HLD, pacemaker-dependence presenting with recurrent hypoxemic respiratory failure after having COVID in early December.  Tested positive on December 2.  History as below, confirmed with patient:  " Was admitted to [facility] on 09/25/2019 -10/08/2019, treated withIV Remdisivir and Decadron,discharged on 1 L nasal cannula toSNFand again on 10/11/2019-12/27/2020withsevere sepsis and hypoxia from aspiration pneumonia, also with UTI.Robert Cuevas was discharged with 2 L nasal cannula and instructions to f/u speech therapy-his nurse at [facility] reports he was satting 84% on 2 L nasal cannula. Increased him to 5 L and his sats wereonlly88%. Instructed by nurse practitioner to call [facility] sent here.Patient denies feeling short of breath. He has no complaints at this time. No chest pain. States he was sent here because of low oxygen levels.'  Denies chest pain, productive cough, fevers, chills, orthopnea. Seen by speech last admission rec'd for puree with thin liquids.  Consults:  PCCM  Procedures:  12/28 thoracentesis right chest therapeutic 700cc  Micro Data:  12/28Pleural fluid culture no WBC, no organisms 12/22 Urine cx PSA and E.Faecalis - pansensitive Antimicrobials:  Cefepime 12/28 >>   Interim history/subjective:  No overnight issues. This morning endorses improved breathing after thoracentesis.   Objective   Blood pressure (!) 135/54, pulse 78, temperature 98.3 F (36.8 C), temperature source Oral, resp. rate (!) 22, height 5\' 8"  (1.727 m), weight 57.2 kg, SpO2 92 %.        Intake/Output Summary (Last 24 hours) at 10/18/2019 0823 Last data filed at 10/18/2019 0300 Gross per  24 hour  Intake 234.38 ml  Output 1900 ml  Net -1665.62 ml   Filed Weights   10/17/19 0304 10/17/19 1800  Weight: 62.1 kg 57.2 kg    Examination: General: elderly, chronically ill appearing HENT: temporal wasting, dry mucus membranes Lungs: right lung diminished, crackles on the left. No cough. No wheezes. No increased wob.  Cardiovascular: RRR, no mrg Abdomen: scaphoid, soft Extremities: thin, no edema Neuro: awake and alert, follows commands MSK: sacral ulcer Lines: PIV  Assessment & Plan:  85 yo gentleman with recent hospitalization for COVID pneumonia now with recurrent respiratory failure:  Acute hypoxemic Respiratory Failure COVID 19 Pneumonia - now 21 days s/p last positive test no longer requires contact/droplet isolation Severe Protein Calorie Malnutrition Body mass index is 19.17 kg/m. Pressure Ulcer - POA Atrial Fibrillation, rate controlled on eliquis Steroid Induced Hyperglycemia  Plan: Maintain oxygen and wean as tolerated flow and FiO2 goal sats over 88% Echocardiogram performed yesterday - follow up read today Maintain Cefepime for PSA and E.Faecalis UTI Continue steroids for post covid state - should probably be discharged on some kind of po taper with close pulmonary follow up.  Maintain net negative fluid balance with lasix; however his BNP is minimally elevated and I feel he is approaching euvolemia. Will back down to BID lasix with plans to transition to po tomorrow?  Would avoid IVF and albumin as able and tolerate slightly lower Blood pressures and uop to accomplish this if necessary Discontinue metformin, increase SSI as necessary for glucose management.  Continue metoprolol for rate control  Best practice:  Diet: Dysphagia 2 diet with nectar thick liquids per MBS on 12/28 Pain/Anxiety/Delirium protocol (if indicated): minimize  sedating medications VAP protocol (if indicated): n/a DVT prophylaxis: therapeutic AC GI prophylaxis: n/a Glucose  control: steroid induced hyperglycemia, increase SSI as needed Foley none - condom cath Mobility: OOB with PT/OT Code Status: DNR Family Communication: patient updated at bedside Disposition: probably ok to transfer to [facility] now that on [facility]   Labs/Imaging  CT Chest 12/28 with dense central peribronchovascular consolidations which spare the periphery, air bronchograms. Large right sided pleural effusion. Left lung relatively spared.   CBC: Recent Labs  Lab 10/12/19 0330 10/14/19 0615 10/15/19 0430 10/16/19 0344 10/17/19 0421 10/18/19 0147  WBC 15.4* 8.1 6.4 5.9 7.1 6.7  NEUTROABS 13.5*  --   --   --  6.3  --   HGB 15.3 15.9 15.4 15.3 15.9 15.9  HCT 46.3 47.3 45.8 45.9 47.7 47.6  MCV 95.9 94.2 93.9 94.3 95.4 94.3  PLT 87* 108* 127* 151 150 0000000    Basic Metabolic Panel: Recent Labs  Lab 10/12/19 0330 10/13/19 0330 10/14/19 0615 10/15/19 0430 10/17/19 0421 10/17/19 0822 10/18/19 0147  NA 136 135 135 136 136  --  139  K 3.6 3.2* 4.5 4.4 3.8  --  3.6  CL 106 103 101 102 104  --  100  CO2 23 24 23 25 22   --  25  GLUCOSE 123* 144* 111* 103* 145*  --  209*  BUN 20 13 12 11 9   --  10  CREATININE 0.57* 0.48* 0.56* 0.59* 0.51*  --  0.49*  CALCIUM 8.2* 8.0* 8.2* 8.2* 8.3*  --  8.7*  MG 1.6* 1.8  --   --   --  1.6* 1.8  PHOS 2.9  --   --   --   --  3.4  --    GFR: Estimated Creatinine Clearance: 52.6 mL/min (A) (by C-G formula based on SCr of 0.49 mg/dL (L)). Recent Labs  Lab 10/12/19 0330 10/13/19 0330 10/15/19 0430 10/16/19 0344 10/17/19 0421 10/18/19 0147  PROCALCITON 0.17 0.14 <0.10  --   --  <0.10  WBC 15.4* 10.1 6.4 5.9 7.1 6.7    Liver Function Tests: Recent Labs  Lab 10/12/19 0330  AST 29  ALT 46*  ALKPHOS 124  BILITOT 1.8*  PROT 5.2*  ALBUMIN 2.7*   No results for input(s): LIPASE, AMYLASE in the last 168 hours. No results for input(s): AMMONIA in the last 168 hours.  ABG    Component Value Date/Time   PHART 7.435 10/17/2019 0826   PCO2ART  34.7 10/17/2019 0826   PO2ART 75.6 (L) 10/17/2019 0826   HCO3 22.9 10/17/2019 0826   ACIDBASEDEF 0.2 10/17/2019 0826   O2SAT 94.7 10/17/2019 0826     Coagulation Profile: No results for input(s): INR, PROTIME in the last 168 hours.  Cardiac Enzymes: No results for input(s): CKTOTAL, CKMB, CKMBINDEX, TROPONINI in the last 168 hours.  HbA1C: Hgb A1c MFr Bld  Date/Time Value Ref Range Status  09/25/2019 04:52 AM 8.4 (H) 4.8 - 5.6 % Final    Comment:    (NOTE) Pre diabetes:          5.7%-6.4% Diabetes:              >6.4% Glycemic control for   <7.0% adults with diabetes     CBG: Recent Labs  Lab 10/14/19 0845 10/15/19 0732 10/17/19 1138 10/17/19 2207 10/18/19 0758  GLUCAP 129* 121* 159* 203* 187*    Critical care time:   The patient is critically ill with multiple organ systems failure and  requires high complexity decision making for assessment and support, frequent evaluation and titration of therapies, application of advanced monitoring technologies and extensive interpretation of multiple databases.   Critical Care Time devoted to patient care services described in this note is 35 minutes. This time reflects the time of my personal involvement. This critical care time does not reflect separately billable procedures or procedure time, teaching time or supervisory time of PA/NP/Med student/Med Resident etc but could involve care discussion time.  [facility] 10/18/2019 8:23 AM  Pager: [phone] After hours pager: [phone]

## 2019-10-18 NOTE — Progress Notes (Signed)
  Speech Language Pathology Treatment: Dysphagia  Patient Details Name: Robert Cuevas MRN: [id number] DOB: 08/06/1930 Today's Date: 10/18/2019 Time: [id number] SLP Time Calculation (min) (ACUTE ONLY): 23 min  Assessment / Plan / Recommendation Clinical Impression  Pt was seen by Speech Therapy for dysphagia tx and to monitor diet tolerance.  Pt was encountered awake/alert with breakfast meal tray in front of him and he was observed with trials of Dysphagia 1 (puree) solids, Dysphagia 2 (fine chop) solids, and nectar-thick liquid.  Pt initially did not have his dentures in place; however, his granddaughter dropped off Fixodent during this session and RN placed top and bottom dentures following oral care.  Pt exhibited prolonged mastication and decreased lingual manipulation of Dysphagia 2 solids with and without dentures in place.  He was observed to have residue on his lingual surface and in left buccal cavity despite taking 1 bite at a time.  Pt with an immediate cough x2 following consumption of Dysphagia 2 solids without dentures, and he experienced a choking event with Dysphagia 2 solids with his dentures in place.  Pt was able to clear his laryngeal vestibule in order to resume respirations following approximately 10-15 seconds; however, he continued to exhibit a dry cough for the next 3-5 minutes.  Pt was observed to be impulsive when consuming nectar-thick liquid, taking multiple sips in a row despite cues for single sips; however, no clinical s/sx of aspiration were observed.  Additionally, pt exhibited timely AP transport and good oral clearance of Dysphagia 1 trials, and no overt s/sx of aspiration were observed.  Observed RN administering whole meds in puree and pt tolerated without difficulty.  Recommend diet change to Dysphagia 1 (puree) solids and continuation of nectar-thick liquid with intermittent supervision to cue for compensatory strategies (listed below).  SLP will continue to f/u for  dysphagia tx and to monitor diet tolerance per POC.     HPI HPI: Pt is an 85 year old man presenting with recurrent hypoxemic respiratory failure after having COVID-19 in early December. Pt was evaluated by SLP on 12/22 with no overt s/s of aspiration noted clinically. PMH: afib, HTN, HLD, pacemaker-dependence       SLP Plan  Continue with current plan of care       Recommendations  Diet recommendations: Dysphagia 1 (puree);Nectar-thick liquid Liquids provided via: Cup Medication Administration: Whole meds with puree Supervision: Patient able to self feed;Intermittent supervision to cue for compensatory strategies Compensations: Minimize environmental distractions;Slow rate;Small sips/bites;Other (Comment);Clear throat intermittently(ONE sip at a time )                Oral Care Recommendations: Oral care BID Follow up Recommendations: Skilled Nursing facility SLP Visit Diagnosis: Dysphagia, oropharyngeal phase (R13.12) Plan: Continue with current plan of care                      Colin Mulders M.S., [place] Office: [phone]  [place] 10/18/2019, 10:49 AM

## 2019-10-18 NOTE — Progress Notes (Signed)
  Echocardiogram 2D Echocardiogram has been performed.  Robert Cuevas 10/18/2019, 10:17 AM

## 2019-10-18 NOTE — Progress Notes (Signed)
PROGRESS NOTE  Robert Cuevas  DOB: 17-Apr-1930  [facility] FM:[id number]  DOA: 10/17/2019 Admitted From: [place]  LOS: 1 day   Chief Complaint  Patient presents with  . COVID  . Low O2 Saturation   Brief narrative: Robert Cuevas is a 85 y.o. male with PMH of HTN, HLD, DM2, pAfib on Eliquis, complete heart block s/p PPM who was sent to the ED from [facility] with hypoxia.  12/2 - diagnosed with COVID-19  12/6- admitted to [facility], treated with remdesivir and Decadron. 12/19-discharged to [facility] on 1 L nasal cannula. 12/22 - admitted to hospital again with severe sepsis and hypoxia from aspiration pneumonia and UTI . 12/27- discharged back to with 2 L nasal cannula and instructions to f/u speech therapy 12/28-brought back to ED again for low oxygen saturation in the 80s unable to correct despite giving high flow oxygen.   In the ED, patient was afebrile, hemodynamically stable.  Required oxygen via high flow nasal cannula to maintain saturation over 90%. Blood gas showed normal pH at 7.4, PCO2 35, PO2 76. CBC and BMP were unremarkable.  CT angio chest and high-resolution CT chest were obtained with findings as below: 1. Tiny nonobstructive segmental sized embolus in the right lower lobe pulmonary arteries. 2. Severe multilobar bilateral pneumonia (right greater than left), compatible with reported COVID-19 infection. 3. Cardiomegaly with biatrial dilatation. 4. Moderate right and small left pleural effusions.  Patient was admitted under hospitalist service for further evaluation and management. Critical care consult was obtained as well.  Subjective: Patient was seen and examined this morning.  Present elderly Caucasian male, not in physical distress.  Not short of breath at the time of my evaluation.  Alert, awake, oriented to place and person, able to tell its December. Does not look volume overloaded.  Assessment/Plan: Acute hypoxic  respiratory failure Severe bilateral multilobar pneumonia -as reported in CT scan Recent COVID 19 Pneumonia - now 21 days s/p last positive test. -Patient tested positive for Covid more than 3 weeks ago on 12/2.  He was appropriately treated and clinically improved. no longer requires contact/droplet isolation -He presents again with episodes of hypoxia. CT scan finding as above showing severe bilateral multilobar pneumonia.  At this time, it is not clear the exact etiology of his symptoms and imaging findings.  Cannot rule out secondary bacterial pneumonia/HCAP/recurrent aspiration pneumonia.  He could also be having post Covid fibrosis.  Procalcitonin level is low but it is hard to rely on while on steroids. -In any case, it would be be appropriate and safe at this time to continue broad-spectrum coverage with IV cefepime as well as steroids. -Oxygen requirement: On supplemental oxygen 5 L via nasal cannula this morning.  Expected to improve gradually. -Speech therapy evaluation obtained.  Dysphagia 1 diet recommended.  Bilateral pleural effusion -Right pleural effusion - 12/28, underwent thoracentesis at bedside by critical care -700 cc of amber fluid removed. -Left pleural effusion - small.  Recent  UTI with Pseudomonas and Enterococcus.   -Completed the course of IV Zosyn on 12/25.    Cardiovascular issues: HTN /HLD /Paroxysmal atrial fibrillation/Complete heart block -Home meds include metoprolol, ramipril, Eliquis, Lipitor, fish oil. -Continue all. Continue to monitor in telemetry. Continue to monitor blood pressure. -status post permanent pacemaker.  Diabetes mellitus type 2  -A1c 8.4 on 12/6, target less than 8. -Metformin on hold.  Currently on sliding scale with Accu-Cheks.  Stage II-III sacral decubitus wound- POA -Secondary to limited  ability to ambulate.  No evidence of acute infection per admitting provider.   -Continued local wound care with Santyl.   Pulmonary embolism   -CT pulmonary angiogram read tiny nonobstructive segmental sized embolus in the right lower lobe pulmonary arteries.  Patient is already on Eliquis anyway.  Prolonged QTc:  -QTC 523 ms on admission. However patient has paced rhythm  -Avoid QT prolonging agents. Keep potassium close to 4 and magnesium close to 2.  Mobility: PT/OT eval ordered Diet: Dysphagia 1 diet per speech Fluid: Not on IV fluid DVT prophylaxis:  Eliquis Code Status:  DNR Family Communication:  I called and updated patient's daughter Ms. Blair Promise this morning.  Family is not satisfied with the level of care at [facility]. They would prefer to take patient home with arrangements. CM made aware.  Expected Discharge:  Continue inpatient care  Consultants:  Critical care  Procedures:  12/28, right thoracentesis  Antimicrobials: Anti-infectives (From admission, onward)   Start     Dose/Rate Route Frequency Ordered Stop   10/17/19 1800  ceFEPIme (MAXIPIME) 2 g in sodium chloride 0.9 % 100 mL IVPB     2 g 200 mL/hr over 30 Minutes Intravenous Every 12 hours 10/17/19 1507          Code Status: DNR   Diet Order            DIET - DYS 1 Room service appropriate? Yes with Assist; Fluid consistency: Nectar Thick  Diet effective now              Infusions:  . ceFEPime (MAXIPIME) IV Stopped (10/18/19 1040)    Scheduled Meds: . apixaban  5 mg Oral BID  . atorvastatin  20 mg Oral Daily  . chlorhexidine  15 mL Mouth Rinse BID  . Chlorhexidine Gluconate Cloth  6 each Topical Daily  . cholecalciferol  1,000 Units Oral Daily  . collagenase  1 application Topical Daily  . furosemide  40 mg Intravenous BID  . insulin aspart  0-15 Units Subcutaneous TID WC  . mouth rinse  15 mL Mouth Rinse q12n4p  . methylPREDNISolone (SOLU-MEDROL) injection  60 mg Intravenous Q6H  . metoprolol tartrate  50 mg Oral BID  . omega-3 acid ethyl esters  1 g Oral BID    PRN meds: acetaminophen, diclofenac Sodium,  ipratropium-albuterol, polyvinyl alcohol, senna-docusate   Objective: Vitals:   10/18/19 0900 10/18/19 1000  BP: (!) 118/46 (!) 114/43  Pulse: 71 70  Resp: 19 (!) 24  Temp:    SpO2: 96% 98%    Intake/Output Summary (Last 24 hours) at 10/18/2019 1129 Last data filed at 10/18/2019 1017 Gross per 24 hour  Intake 234.38 ml  Output 3150 ml  Net -2915.62 ml   Filed Weights   10/17/19 0304 10/17/19 1800  Weight: 62.1 kg 57.2 kg   Weight change: -4.943 kg Body mass index is 19.17 kg/m.   Physical Exam: General exam: Appears calm and comfortable.  Skin: No rashes, lesions or ulcers. HEENT: Atraumatic, normocephalic, supple neck, no obvious bleeding Lungs: Diminished air entry in both bases, I hear some crackles in the right.  No wheezing CVS: Regular rate and rhythm, no murmur GI/Abd soft, nontender, nondistended, bowel sound present CNS: Alert, awake, oriented to place and person, knows it is December.  Able to follow motor commands Psychiatry: Mood appropriate Extremities: No pedal edema, no calf tenderness  Data Review: I have personally reviewed the laboratory data and studies available.  Recent Labs  Lab 10/12/19  0330 10/14/19 0615 10/15/19 0430 10/16/19 0344 10/17/19 0421 10/18/19 0147  WBC 15.4* 8.1 6.4 5.9 7.1 6.7  NEUTROABS 13.5*  --   --   --  6.3  --   HGB 15.3 15.9 15.4 15.3 15.9 15.9  HCT 46.3 47.3 45.8 45.9 47.7 47.6  MCV 95.9 94.2 93.9 94.3 95.4 94.3  PLT 87* 108* 127* 151 150 176   Recent Labs  Lab 10/12/19 0330 10/13/19 0330 10/14/19 0615 10/15/19 0430 10/17/19 0421 10/17/19 0822 10/18/19 0147  NA 136 135 135 136 136  --  139  K 3.6 3.2* 4.5 4.4 3.8  --  3.6  CL 106 103 101 102 104  --  100  CO2 23 24 23 25 22   --  25  GLUCOSE 123* 144* 111* 103* 145*  --  209*  BUN 20 13 12 11 9   --  10  CREATININE 0.57* 0.48* 0.56* 0.59* 0.51*  --  0.49*  CALCIUM 8.2* 8.0* 8.2* 8.2* 8.3*  --  8.7*  MG 1.6* 1.8  --   --   --  1.6* 1.8  PHOS 2.9  --    --   --   --  3.4  --     Terrilee Croak, MD  [facility] 10/18/2019

## 2019-10-19 DIAGNOSIS — E876 Hypokalemia: Secondary | ICD-10-CM

## 2019-10-19 DIAGNOSIS — T17908S Unspecified foreign body in respiratory tract, part unspecified causing other injury, sequela: Secondary | ICD-10-CM

## 2019-10-19 DIAGNOSIS — Z7189 Other specified counseling: Secondary | ICD-10-CM

## 2019-10-19 DIAGNOSIS — R636 Underweight: Secondary | ICD-10-CM | POA: Diagnosis present

## 2019-10-19 DIAGNOSIS — J8489 Other specified interstitial pulmonary diseases: Secondary | ICD-10-CM

## 2019-10-19 DIAGNOSIS — Z515 Encounter for palliative care: Secondary | ICD-10-CM

## 2019-10-19 DIAGNOSIS — J90 Pleural effusion, not elsewhere classified: Secondary | ICD-10-CM | POA: Diagnosis present

## 2019-10-19 DIAGNOSIS — E43 Unspecified severe protein-calorie malnutrition: Secondary | ICD-10-CM | POA: Diagnosis present

## 2019-10-19 DIAGNOSIS — J690 Pneumonitis due to inhalation of food and vomit: Principal | ICD-10-CM

## 2019-10-19 LAB — BASIC METABOLIC PANEL
Anion gap: 12 (ref 5–15)
BUN: 17 mg/dL (ref 8–23)
CO2: 30 mmol/L (ref 22–32)
Calcium: 8.8 mg/dL — ABNORMAL LOW (ref 8.9–10.3)
Chloride: 97 mmol/L — ABNORMAL LOW (ref 98–111)
Creatinine, Ser: 0.47 mg/dL — ABNORMAL LOW (ref 0.61–1.24)
GFR calc Af Amer: >60 mL/min (ref >60)
GFR calc non Af Amer: >60 mL/min (ref >60)
Glucose, Bld: 233 mg/dL — ABNORMAL HIGH (ref 70–99)
Potassium: 2.9 mmol/L — ABNORMAL LOW (ref 3.5–5.1)
Sodium: 139 mmol/L (ref 135–145)

## 2019-10-19 LAB — GLUCOSE, CAPILLARY
Glucose-Capillary: 215 mg/dL — ABNORMAL HIGH (ref 70–99)
Glucose-Capillary: 251 mg/dL — ABNORMAL HIGH (ref 70–99)
Glucose-Capillary: 220 mg/dL — ABNORMAL HIGH (ref 70–99)

## 2019-10-19 LAB — MAGNESIUM: Magnesium: 1.8 mg/dL (ref 1.7–2.4)

## 2019-10-19 MED ORDER — POTASSIUM CHLORIDE 10 MEQ/100ML IV SOLN
10.0000 meq | INTRAVENOUS | Status: DC
  Administered 2019-10-19 (×2): 10 meq via INTRAVENOUS
  Filled 2019-10-19 (×3): qty 100

## 2019-10-19 MED ORDER — POTASSIUM CHLORIDE 20 MEQ PO PACK
40.0000 meq | PACK | Freq: Once | ORAL | Status: AC
  Administered 2019-10-19: 40 meq via ORAL
  Filled 2019-10-19: qty 2

## 2019-10-19 MED ORDER — INSULIN ASPART 100 UNIT/ML ~~LOC~~ SOLN
4.0000 [IU] | Freq: Three times a day (TID) | SUBCUTANEOUS | Status: DC
  Administered 2019-10-19 – 2019-10-20 (×5): 4 [IU] via SUBCUTANEOUS

## 2019-10-19 MED ORDER — ADULT MULTIVITAMIN W/MINERALS CH
1.0000 | ORAL_TABLET | Freq: Every day | ORAL | Status: DC
  Administered 2019-10-19 – 2019-10-25 (×7): 1 via ORAL
  Filled 2019-10-19 (×7): qty 1

## 2019-10-19 MED ORDER — METHYLPREDNISOLONE SODIUM SUCC 125 MG IJ SOLR
60.0000 mg | Freq: Two times a day (BID) | INTRAMUSCULAR | Status: DC
  Administered 2019-10-19 – 2019-10-20 (×2): 60 mg via INTRAVENOUS
  Filled 2019-10-19 (×2): qty 2

## 2019-10-19 NOTE — Progress Notes (Signed)
Initial Nutrition Assessment   DOCUMENTATION CODES:   Severe malnutrition in context of acute illness/injury  INTERVENTION:  - will order Hormel Shake BID, each shake provides 500 kcal and 22 grams protein. - Magic Cup TID with meals, each supplement provides 290 kcal and 9 grams of protein. - will order daily multivitamin with minerals.  - continue to encourage PO intakes and floor staff to provide feeding assistance.    NUTRITION DIAGNOSIS:   Severe Malnutrition related to acute illness(recent COVID-19 infection) as evidenced by severe fat depletion, severe muscle depletion.  GOAL:   Patient will meet greater than or equal to 90% of their needs  MONITOR:   PO intake, Supplement acceptance, Labs, Weight trends  REASON FOR ASSESSMENT:   Other (Comment)(Pressure Injury report)  ASSESSMENT:   85 year old man with history of afib on AC, HTN, HLD, and pacemaker-dependence. He presented to the ED with recurrent hypoxemic respiratory failure. Patient was found to be COVID positive on 12/2 and was at [facility] before being discharged to [facility].  He is ordered Thicken-Up due to need for nectar-thick liquids. SLP is following and recommended current diet (dysphagia 1, nectar-thick liquids) yesterday. No intakes documented since admission. Tech at bedside and reported that breakfast was on the way and that patient has not had anything yet this AM.  Flow sheet indicates patient is a/o x3, disoriented to situation. Patient seemed more confused than this during RD visit. It does sound like he needs assistance at meal times. He denied abdominal pain/pressure or nausea and was feeling hungry. He recalls he ate yesterday and that he had something that tasted good but he was unsure when or what he ate. Unable to get appropriate-sounding answers to other questions asked. Patient also mumbles incomprehensibly at times.  Per chart review, current weight is 126 lb and weight on 12/6 was 134 lb. This  indicates 8 lb weight loss (6% body weight) in the past 3 weeks; significant for time frame.   Per notes: - acute hypoxemic respiratory failure - severe protein-calorie malnutrition - sacral ulcer POA - acute on CHF - UTI   Labs reviewed; K: 2.9 mmol/l, Cl: 97 mmol/l, creatinine: 0.47 mg/dl. Medications reviewed; 1000 units cholecalciferol/day, 40 mg IV lasix BID, sliding scale novolog, 4 units novolog TID, 60 mg solu-medrol BID, 1 g lovaza BID, 40 mEq Klor-Con x1 dose 12/30.     NUTRITION - FOCUSED PHYSICAL EXAM:    Most Recent Value  Orbital Region  Moderate depletion  Upper Arm Region  Moderate depletion  Thoracic and Lumbar Region  Moderate depletion  Buccal Region  Moderate depletion  Temple Region  Moderate depletion  Clavicle Bone Region  Severe depletion  Clavicle and Acromion Bone Region  Severe depletion  Scapular Bone Region  Unable to assess  Dorsal Hand  Moderate depletion  Patellar Region  Severe depletion  Anterior Thigh Region  Severe depletion  Posterior Calf Region  Severe depletion  Edema (RD Assessment)  None  Hair  Reviewed  Eyes  Reviewed  Mouth  Reviewed  Skin  Reviewed  Nails  Reviewed       Diet Order:   Diet Order            DIET - DYS 1 Room service appropriate? Yes with Assist; Fluid consistency: Nectar Thick  Diet effective now              EDUCATION NEEDS:   No education needs have been identified at this time  Skin:  Skin Assessment: Skin Integrity Issues: Skin Integrity Issues:: Stage II Stage II: sacrum  Last BM:  12/29  Height:   Ht Readings from Last 1 Encounters:  10/17/19 5\' 8"  (1.727 m)    Weight:   Wt Readings from Last 1 Encounters:  10/17/19 57.2 kg    Ideal Body Weight:  70 kg  BMI:  Body mass index is 19.17 kg/m.  Estimated Nutritional Needs:   Kcal:  1890-2060 kcal  Protein:  80-90 grams  Fluid:  >/= 2 L/day     Jarome Matin, MS, RD, LDN, [facility] Inpatient Clinical Dietitian Pager #  [phone] After hours/weekend pager # [phone]

## 2019-10-19 NOTE — Consult Note (Signed)
Palliative Care Consult Note  Reason for consult: Goals of care in light of readmission for hypoxemia following recent COVID-19 pneumonia  Plan of care consult received.  Chart reviewed including personal review of pertinent labs and imaging.  Discussed with patient's bedside care team.  Briefly, Robert Cuevas is an 85 year old male with past medical history of hypertension, hyperlipidemia, paroxysmal A. fib on chronic Eliquis, complete heart block status post pacemaker, diabetes mellitus, recent COVID-19 infection (diagnosed 09/21/2019 and admitted to [facility] 09/24/2021- 10/08/2019), who was discharged to SNF but returned from 10/11/2019 -10/16/2019 with severe sepsis and hypoxemia from aspiration and UTI and then was discharged again to [facility] but returned 10/17/2019 with decreased oxygen saturation.    He has been treated this admission for multilobar pneumonia and remains on steroids as well as antibiotics.  Family has noted desire to take him home at end of hospitalization and palliative consulted for goals of care.  I met today with Robert Cuevas.  He is awake and alert and reports his only concern is some pain in his right arm at IV site.  He tells me the most important things to him are his family and being back [place].  He asked me to call his wife to discuss.  I called and was able to reach patient's wife, Robert Cuevas.  We reviewed his clinical course over the past month with recurrent hospitalizations and time spent in SNF.  This has been very hard on both her and Robert Cuevas.  She reports that family has been in discussion and they are determined to have Robert Cuevas transition back to their home on discharge.  She states that he has not fared well at skilled facility and she has worked as a caregiver in the past and believes that he would do better at home with her assistance as well as the assistance of his children as long as they can continue to have rehab/home health  services at their home.   We talked about long-term goals for Robert Cuevas being to spend as much time as possible in his own home with his family.  Robert Cuevas and I discussed that the hospital can be useful as long as he is getting well enough from care he receives at the hospital to enjoy his time at home, but I am concerned with his recurrent admissions that there may come a time in the near future where, if his goal is to be at home, he may be better served to plan on being at home and bringing care to him at home rather repeated trips to the hospital. We breifly discussed hospice as an option if he determines that his goal is to be at home and not return to the hospital.  She is in agreement that a good plan would be to plan to take him home with home health as they have previously been arranging. He has done well with rehabbing in the past. If he does well at home and continues to thrive, I encouraged they continue with this plan. If, however he is unable to regain function and he continues to decline, I recommended that she speak with his PCP to determine if he may be better served by focusing his care on staying at home with support of organization such as hospice.  -DNR/DNI -He has an up to date MOST form in his medical record.  It states: DNR, Limited additional interventions, IVF and ABX if indicated, feeding tube  for defined trial period. -Family is clear in goal to work to transition home following hospitalization.  His wife feels that they will be able to meet his care needs and he will be much happier and receive more personalized care than at SNF.  We had initial discussion that, if he continues to decline in his nutrition and functional status at home, he may be a good candidate for home hospice if he does not desire to return to the hospital.  Recommended she discuss this further with his PCP once clearer picture of his progress at home.    Questions and concerns addressed.   PMT will continue  to support holistically.  Start Time: 44 Finish Time: 0955  Total time: 75 minutes  Greater than 50%  of this time was spent counseling and coordinating care related to the above assessment and plan.  Micheline Rough, MD [facility] [phone]

## 2019-10-19 NOTE — Progress Notes (Signed)
Report called to [facility] 1419. Clarified with MD Dr Anne Hahn that she is ok with him going to the floor with [facility] and goal for sats is 88 or greater at this time.

## 2019-10-19 NOTE — Evaluation (Signed)
Occupational Therapy Evaluation Patient Details Name: Robert Cuevas MRN: [id number] DOB: 15-Oct-1930 Today's Date: 10/19/2019    History of Present Illness This is the 3rd admission for this 85 year old man with acute or chronic respiratory faillure.  Had covid pna and was d/c'd from [place] on 12/19, from where he had been 2 weeks.  He returned to hospital on 12/22 with aspiration pna, severe sepsis, uti, and hypoxia.  PMH:  pacemaker, complete heart block, A Fib, DM and chronic Eliquis   Clinical Impression   This 85 year old man was admitted for the above.  Only got to EOB due to decreased BP today.  Assessed from bed level and assisted NT with hygiene and changing sheets. Wife came at end of session and hopes he will get strong enough for home.  Pt needed mod +2 for bed mobility and up to total A for LB adls from bed level. Will follow in acute setting with min A level goals    Follow Up Recommendations  SNF;Supervision/Assistance - 24 hour    Equipment Recommendations  (tba further)    Recommendations for Other Services       Precautions / Restrictions Precautions Precautions: Fall;ICD/Pacemaker Precaution Comments: watch BP and sats Restrictions Weight Bearing Restrictions: No      Mobility Bed Mobility     Rolling: Mod assist   Supine to sit: Mod assist;+2 for physical assistance Sit to supine: Mod assist;+2 for safety/equipment      Transfers                 General transfer comment: not attempted    Balance                                           ADL either performed or assessed with clinical judgement   ADL   Eating/Feeding: Supervision/ safety   Grooming: Set up   Upper Body Bathing: Minimal assistance   Lower Body Bathing: Maximal assistance   Upper Body Dressing : Minimal assistance   Lower Body Dressing: Total assistance                 General ADL Comments: all adls based on clinical judgment from bed level  due to intolerance of sitting. Pt had wanted to get to [facility] but BP was too low     Vision         Perception     Praxis      Pertinent Vitals/Pain Pain Assessment: No/denies pain     Hand Dominance     Extremity/Trunk Assessment Upper Extremity Assessment Upper Extremity Assessment: Generalized weakness LUE Deficits / Details: limited L shoulder           Communication Communication Communication: HOH   Cognition Arousal/Alertness: Awake/alert Behavior During Therapy: WFL for tasks assessed/performed                                   General Comments: follows commands; could not understand why he couldn;t get up to [facility]. Pt antsy and had difficulty staying still during BP readings; hand over hand A given   General Comments  intial BP 95/44 MAP 60 in bed. Sat up and BP 84/31; immediately repeated a pt felt "a little dizzy:.  BP 81/27.  Supine 106/52    Exercises  Shoulder Instructions      Home Living Family/patient expects to be discharged to:: Unsure     Type of Home: House Home Access: Level entry     Home Layout: One level     Bathroom Shower/Tub: Occupational psychologist: Standard     Home Equipment: Cane - single point;Shower seat - built in   Additional Comments: wife hopes to take pt home      Prior Functioning/Environment          Comments: assistance needed since recent admission        OT Problem List: Decreased strength;Decreased range of motion;Decreased activity tolerance;Impaired balance (sitting and/or standing);Cardiopulmonary status limiting activity;Decreased knowledge of use of DME or AE;Decreased cognition      OT Treatment/Interventions: Self-care/ADL training;Therapeutic exercise;Energy conservation;DME and/or AE instruction;Therapeutic activities;Patient/family education;Balance training    OT Goals(Current goals can be found in the care plan section) Acute Rehab OT Goals Patient Stated Goal:  get stronger OT Goal Formulation: With patient Time For Goal Achievement: 11/02/19 Potential to Achieve Goals: Good ADL Goals Pt Will Transfer to Toilet: with min assist;bedside commode;stand pivot transfer Pt Will Perform Toileting - Clothing Manipulation and hygiene: with min assist;sit to/from stand Additional ADL Goal #1: pt will perform UB adls with set up and LB adls with min A, sit to stand Additional ADL Goal #2: pt will initiate rest breaks with min cues Additional ADL Goal #3: pt will perform bed mobility with min A for adls  OT Frequency: Min 2X/week   Barriers to D/C:            Co-evaluation              AM-PAC OT "6 Clicks" Daily Activity     Outcome Measure Help from another person eating meals?: A Little Help from another person taking care of personal grooming?: A Little Help from another person toileting, which includes using toliet, bedpan, or urinal?: Total Help from another person bathing (including washing, rinsing, drying)?: A Lot Help from another person to put on and taking off regular upper body clothing?: A Little Help from another person to put on and taking off regular lower body clothing?: Total 6 Click Score: 13   End of Session    Activity Tolerance: (BP) Patient left: in bed;with call bell/phone within reach;with nursing/sitter in room;with bed alarm set  OT Visit Diagnosis: Muscle weakness (generalized) (M62.81)                Time: [id number] OT Time Calculation (min): 26 min Charges:  OT General Charges $OT Visit: 1 Visit OT Evaluation $OT Eval Moderate Complexity: 1 Mod  [place], OTR/L Acute Rehabilitation Services 10/19/2019  [place] 10/19/2019, 1:07 PM

## 2019-10-19 NOTE — Evaluation (Signed)
Physical Therapy Evaluation Patient Details Name: Robert Cuevas MRN: [id number] DOB: 04-22-1930 Today's Date: 10/19/2019   History of Present Illness  85 yo male admitted with acute on chronic respiratory failure, PE, severe Pna. Initially diagnosed with COVID 09/21/19. Admitted to [facility] then d/c'd to SNF. Multiple readmissions to hospital since. Hx of pacemaker, DM, A fib, HB  Clinical Impression  On eval, pt required Mod assist +2 for mobility. He was able to sit EOB with Min guard assist. Low BP reading (80s/40s) so deferred OOB for safety reasons. O2: 88% on 5L [place] with activity. Will continue to follow and progress activity as tolerated. Pt could benefit from [facility] however family has been unhappy with care at SNF and they plan to care for pt at home.     Follow Up Recommendations Home health PT;Supervision/Assistance - 24 hour(could benefit from SNF but family plans to take pt home)    Equipment Recommendations  Rolling walker with 5" wheels    Recommendations for Other Services       Precautions / Restrictions Precautions Precautions: Fall;ICD/Pacemaker Precaution Comments: requiring O2, monitor BP and sats Restrictions Weight Bearing Restrictions: No      Mobility  Bed Mobility Overal bed mobility: Needs Assistance Bed Mobility: Rolling;Supine to Sit;Sit to Supine Rolling: Mod assist   Supine to sit: Mod assist;+2 for physical assistance;+2 for safety/equipment Sit to supine: Mod assist;+2 for physical assistance;+2 for safety/equipment   General bed mobility comments: Increased time. Assist for trunk. Cues for safety, technique.  Transfers Overall transfer level: Needs assistance             General transfer comment: NT on today due to low BP in sitting 80s/40s. Pt denied dizziness but deferred OOB for safety  Ambulation/Gait                Stairs            Wheelchair Mobility    Modified Rankin (Stroke Patients Only)       Balance  Overall balance assessment: Needs assistance Sitting-balance support: Feet supported;Bilateral upper extremity supported Sitting balance-Leahy Scale: Fair                                       Pertinent Vitals/Pain Pain Assessment: No/denies pain    Home Living Family/patient expects to be discharged to:: Unsure Living Arrangements: Spouse/significant other Available Help at Discharge: Family Type of Home: House Home Access: Level entry     Home Layout: One level Home Equipment: Cane - single point;Shower seat - built in Additional Comments: wife hopes to take pt home    Prior Function Level of Independence: Independent         Comments: assistance needed since recent admission     Hand Dominance   Dominant Hand: Right    Extremity/Trunk Assessment   Upper Extremity Assessment Upper Extremity Assessment: Defer to OT evaluation LUE Deficits / Details: limited L shoulder    Lower Extremity Assessment Lower Extremity Assessment: Generalized weakness    Cervical / Trunk Assessment Cervical / Trunk Assessment: Kyphotic  Communication   Communication: HOH  Cognition Arousal/Alertness: Awake/alert Behavior During Therapy: WFL for tasks assessed/performed Overall Cognitive Status: Within Functional Limits for tasks assessed  General Comments: follows commands; could not understand why he couldn't get up to [facility]. Pt antsy and had difficulty staying still during BP readings      General Comments General comments (skin integrity, edema, etc.): intial BP 95/44 MAP 60 in bed. Sat up and BP 84/31; immediately repeated a pt felt "a little dizzy:.  BP 81/27.  Supine 106/52    Exercises     Assessment/Plan    PT Assessment Patient needs continued PT services  PT Problem List Decreased strength;Decreased mobility;Decreased activity tolerance;Decreased balance;Decreased knowledge of use of DME;Decreased safety  awareness       PT Treatment Interventions DME instruction;Gait training;Therapeutic exercise;Patient/family education;Functional mobility training;Balance training;Therapeutic activities    PT Goals (Current goals can be found in the Care Plan section)  Acute Rehab PT Goals Patient Stated Goal: get stronger PT Goal Formulation: With patient Time For Goal Achievement: 11/02/19 Potential to Achieve Goals: Fair    Frequency Min 3X/week   Barriers to discharge        Co-evaluation               AM-PAC PT "6 Clicks" Mobility  Outcome Measure Help needed turning from your back to your side while in a flat bed without using bedrails?: A Little Help needed moving from lying on your back to sitting on the side of a flat bed without using bedrails?: A Lot Help needed moving to and from a bed to a chair (including a wheelchair)?: A Lot Help needed standing up from a chair using your arms (e.g., wheelchair or bedside chair)?: A Lot Help needed to walk in hospital room?: A Lot Help needed climbing 3-5 steps with a railing? : Total 6 Click Score: 12    End of Session Equipment Utilized During Treatment: Oxygen Activity Tolerance: Patient tolerated treatment well Patient left: in bed;with call bell/phone within reach;with nursing/sitter in room(with NT and OT in room assisting pt)   PT Visit Diagnosis: Muscle weakness (generalized) (M62.81);Difficulty in walking, not elsewhere classified (R26.2)    Time: [id number] PT Time Calculation (min) (ACUTE ONLY): 16 min   Charges:   PT Evaluation $PT Eval Moderate Complexity: 1 Mod            Ciel Yanes P, PT Acute Rehabilitation

## 2019-10-19 NOTE — Progress Notes (Signed)
Progress Note    Robert Cuevas  [id number] DOB: 10/11/1930  DOA: 10/17/2019 PCP: [facility]    Brief Narrative:   Chief complaint: Follow-up hypoxia/shortness of breath  Medical records reviewed and are as summarized below:  Robert Cuevas is an 85 y.o. male with a PMH of hypertension, hyperlipidemia, PAF on chronic Eliquis, complete heart block status post pacemaker, type 2 diabetes who was admitted from his SNF with hypoxia in the setting of recently being diagnosed with COVID-19.  He was previously hospitalized 09/25/2019-10/08/2019 at which time he received a treatment course of IV remdesivir and Decadron, was subsequently readmitted 10/11/2019-10/16/2019 with severe sepsis and hypoxia secondary to aspiration pneumonia/UTI.  Readmitted a third time 10/17/19 with ongoing hypoxia requiring increasing oxygen.  CT angiogram showed nonobstructive segmental pulmonary emboli in the right lower lobe and severe multilobar pneumonia with moderate right and small left pleural effusions.  Assessment/Plan:   Principal Problem:   Acute on chronic respiratory failure with hypoxia (HCC) secondary to multilobar pneumonia in the setting of recent COVID-19 associated pneumonia and aspiration complicated by pulmonary emboli At this time, primary driver of the patient's pneumonia is felt to be secondary bacterial pneumonia in the setting of aspiration and he is therefore being covered for healthcare associated pathogens including Pseudomonas.  Continue cefepime.  He also continues on steroids and supplemental oxygen.  Status post speech therapy evaluation with dysphagia 1 diet recommended.  With regard to the pulmonary emboli, the patient remains on Eliquis.  Given the patient's recurrent readmissions and persistent hypoxia, a palliative care consultation has been requested.  Performed today and at this time, the patient and family continue to want the full scope of treatment with the hope  of discharging home.  Blood cultures negative.  Wean oxygen as tolerated, continues on 4 L.  Active Problems:   Enterococcus/Pseudomonas UTI Remains on cefepime which should cover.    Hypokalemia 3 potassium runs ordered for potassium of 2.9 earlier today, as well as 40 mEq of oral replacement.  Magnesium levels WNL.  Recheck potassium in the morning.    Bilateral pleural effusions Underwent right-sided thoracentesis 10/17/2019 with 700 cc of fluid removed.  Left-sided pleural effusion is small.  Pleural fluid cultures were negative.     Dysphagia Evaluated by speech therapy, continue dysphagia 1 diet.    Paroxysmal atrial fibrillation (HCC)/Long term (current) use of anticoagulants/status post pacemaker Continue metoprolol.    COVID-19 virus infection No longer felt to be shedding virus and therefore is off isolation.    Diabetes mellitus type II, non insulin dependent ([place]), uncontrolled Hemoglobin A1c 8.4% on 09/25/2019.  Metformin on hold.  Chronic currently being managed with SSI.  CBGs 187-221.  Add NovoLog 4 units before each meal.  Needs better glycemic control.  Steroids likely a complicating factor.    Pressure ulcer, stage II to sacrum, present prior to admission Continue wound care per nursing.    Hyperlipidemia Continue Lipitor.    Severe protein calorie malnutrition/underweight  Body mass index is 19.17 kg/m.  Dietitian consultation requested and performed.  Noted to have severe fat depletion and severe muscle depletion consistent with severe malnutrition.  Continue supplements as ordered.    Family Communication/Anticipated D/C date and plan/Code Status   DVT prophylaxis: On chronic Eliquis. Code Status: DNR.  Family Communication: Daughter updated by telephone. Disposition Plan: Transfer to floor. Likely stable for d/c in the next 24-48 hours. PT/OT evaluations requested to see if patient will need rehab services  at home since family wants to take him  home.   Medical Consultants:    Critical care  Palliative care   Anti-Infectives:    Cefepime 10/17/2019--->  Subjective:   Robert Cuevas says his breathing is "okay ".  He denies cough and fever.  Denies nausea and vomiting and says his bowels last moved yesterday.  Does complain of right arm pain with potassium infusion.  Objective:    Vitals:   10/19/19 0000 10/19/19 0400 10/19/19 0500 10/19/19 0600  BP: (!) 113/49  104/61 (!) 121/55  Pulse: 70  93 63  Resp: 16  16 16   Temp: (!) 97.5 F (36.4 C) (!) 97.4 F (36.3 C)    TempSrc: Axillary Axillary    SpO2: 92%  94% 92%  Weight:      Height:        Intake/Output Summary (Last 24 hours) at 10/19/2019 0659 Last data filed at 10/19/2019 0654 Gross per 24 hour  Intake 206.91 ml  Output 1500 ml  Net -1293.09 ml   Filed Weights   10/17/19 0304 10/17/19 1800  Weight: 62.1 kg 57.2 kg    Exam: General: No acute distress. Cardiovascular: Heart sounds show a regular rate, and rhythm. No gallops or rubs. No murmurs. No JVD. Lungs: Clear to auscultation bilaterally with good air movement. No rales, rhonchi or wheezes. Abdomen: Soft, nontender, nondistended with normal active bowel sounds. No masses. No hepatosplenomegaly. Neurological: Alert and oriented 3. Moves all extremities 4 with equal strength. Cranial nerves II through XII grossly intact. Skin: Warm and dry. Stage II sacral ulcer noted as described below. Extremities: No clubbing or cyanosis. No edema. Pedal pulses 2+. Psychiatric: Mood and affect are normal. Insight and judgment are fair.  Pressure Injury 10/17/19 Sacrum Medial Stage 2 -  Partial thickness loss of dermis presenting as a shallow open injury with a red, pink wound bed without slough. (Active)  10/17/19 1742  Location: Sacrum  Location Orientation: Medial  Staging: Stage 2 -  Partial thickness loss of dermis presenting as a shallow open injury with a red, pink wound bed without slough.  Wound  Description (Comments):   Present on Admission: Yes    Data Reviewed:   I have personally reviewed following labs and imaging studies:  Labs: Labs show the following:   Basic Metabolic Panel: Recent Labs  Lab 10/13/19 0330 10/14/19 0615 10/15/19 0430 10/17/19 0421 10/17/19 0822 10/18/19 0147 10/19/19 0126  NA 135 135 136 136  --  139 139  K 3.2* 4.5 4.4 3.8  --  3.6 2.9*  CL 103 101 102 104  --  100 97*  CO2 24 23 25 22   --  25 30  GLUCOSE 144* 111* 103* 145*  --  209* 233*  BUN 13 12 11 9   --  10 17  CREATININE 0.48* 0.56* 0.59* 0.51*  --  0.49* 0.47*  CALCIUM 8.0* 8.2* 8.2* 8.3*  --  8.7* 8.8*  MG 1.8  --   --   --  1.6* 1.8 1.8  PHOS  --   --   --   --  3.4  --   --    GFR Estimated Creatinine Clearance: 52.6 mL/min (A) (by C-G formula based on SCr of 0.47 mg/dL (L)).  CBC: Recent Labs  Lab 10/14/19 0615 10/15/19 0430 10/16/19 0344 10/17/19 0421 10/18/19 0147  WBC 8.1 6.4 5.9 7.1 6.7  NEUTROABS  --   --   --  6.3  --  HGB 15.9 15.4 15.3 15.9 15.9  HCT 47.3 45.8 45.9 47.7 47.6  MCV 94.2 93.9 94.3 95.4 94.3  PLT 108* 127* 151 150 176   CBG: Recent Labs  Lab 10/17/19 2207 10/18/19 0758 10/18/19 1132 10/18/19 1620 10/18/19 2105  GLUCAP 203* 187* 199* 221* 198*   D-Dimer: Recent Labs    10/17/19 0420  DDIMER 0.74*    Microbiology Recent Results (from the past 240 hour(s))  Blood Culture (routine x 2)     Status: None   Collection Time: 10/11/19  6:42 AM   Specimen: BLOOD  Result Value Ref Range Status   Specimen Description   Final    BLOOD LEFT ARM Performed at [facility] [address], [place], [place] [address]    Special Requests   Final    BOTTLES DRAWN AEROBIC AND ANAEROBIC Blood Culture results may not be optimal due to an excessive volume of blood received in culture bottles Performed at [facility] [address] [place], [place] [address]    Culture   Final    NO GROWTH 5  DAYS Performed at [facility], [place] [address], [place], [place] [address]    Report Status 10/16/2019 FINAL  Final  Blood Culture (routine x 2)     Status: None   Collection Time: 10/11/19  6:42 AM   Specimen: BLOOD  Result Value Ref Range Status   Specimen Description   Final    BLOOD RIGHT ARM Performed at [facility] [address], [place], [place] [address]    Special Requests   Final    BOTTLES DRAWN AEROBIC AND ANAEROBIC Blood Culture adequate volume Performed at [facility] [address] [place], [place] [address]    Culture   Final    NO GROWTH 5 DAYS Performed at [facility], [place] [address] [place], [place] [address]    Report Status 10/16/2019 FINAL  Final  Urine culture     Status: Abnormal   Collection Time: 10/11/19  6:42 AM   Specimen: In/Out Cath Urine  Result Value Ref Range Status   Specimen Description   Final    IN/OUT CATH URINE Performed at [facility] [address], [place], [place] [address]    Special Requests   Final    NONE Performed at [facility], [place] [address] [place], [place] [address]    Culture (A)  Final    >=100,000 COLONIES/mL PSEUDOMONAS AERUGINOSA >=100,000 COLONIES/mL ENTEROCOCCUS FAECALIS    Report Status 10/14/2019 FINAL  Final   Organism ID, Bacteria PSEUDOMONAS AERUGINOSA (A)  Final   Organism ID, Bacteria ENTEROCOCCUS FAECALIS (A)  Final      Susceptibility   Enterococcus faecalis - MIC*    AMPICILLIN <=2 SENSITIVE Sensitive     NITROFURANTOIN <=16 SENSITIVE Sensitive     VANCOMYCIN 1 SENSITIVE Sensitive     * >=100,000 COLONIES/mL ENTEROCOCCUS FAECALIS   Pseudomonas aeruginosa - MIC*    CEFTAZIDIME 4 SENSITIVE Sensitive     CIPROFLOXACIN <=0.25 SENSITIVE Sensitive     GENTAMICIN <=1 SENSITIVE Sensitive     IMIPENEM 1 SENSITIVE Sensitive     PIP/TAZO 8 SENSITIVE Sensitive     CEFEPIME 2 SENSITIVE Sensitive     *  >=100,000 COLONIES/mL PSEUDOMONAS AERUGINOSA  Body fluid culture (includes gram stain)     Status: None (Preliminary result)   Collection Time: 10/17/19  2:46 PM   Specimen:  Pleural Fluid  Result Value Ref Range Status   Specimen Description   Final    PLEURAL Performed at [facility] [address] [place], [place] [address]    Special Requests   Final    NONE Performed at [facility], [place] [address] [place], [place] [address]    Gram Stain NO WBC SEEN NO ORGANISMS SEEN   Final   Culture   Final    NO GROWTH < 24 HOURS Performed at [facility], [place] [address], [place], [place] [address]    Report Status PENDING  Incomplete    Procedures and diagnostic studies:  DG Chest 1 View  Result Date: 10/17/2019 CLINICAL DATA:  Decreased oxygen saturation EXAM: CHEST  1 VIEW COMPARISON:  Film from earlier in the same day. FINDINGS: Cardiac shadow is stable. Pacing device is again seen. Diffuse infiltrates are noted right greater than left stable from prior exam. No acute bony abnormality is seen. No pneumothorax is noted. IMPRESSION: Stable appearance of the chest when compared with the prior study. Electronically Signed   By: Inez Catalina M.D.   On: 10/17/2019 15:16   CT ANGIO CHEST PE W OR WO CONTRAST  Result Date: 10/17/2019 CLINICAL DATA:  85 year old male with history of respiratory failure. COVID-19 infection with increasing oxygen requirements. EXAM: CT CHEST WITHOUT CONTRAST CT ANGIOGRAPHY CHEST WITH CONTRAST TECHNIQUE: Multidetector CT imaging of the chest was performed following the standard protocol without intravenous contrast. High resolution imaging of the lungs, as well as inspiratory and expiratory imaging, was performed. Additional multidetector CT imaging of the chest was performed using the standard protocol during bolus administration of intravenous contrast. Multiplanar CT image reconstructions and MIPs were obtained to  evaluate the vascular anatomy. COMPARISON:  Chest CTA 03/03/2013. FINDINGS: Cardiovascular: Although study is limited by considerable patient respiratory motion, there is a small nonocclusive central filling defect in a right lower lobe segmental sized branch (axial image 129 of series 7) of the pulmonary arteries, indicative of pulmonary embolism. No other larger more central or lobar filling defect is noted. Heart size is enlarged with biatrial dilatation (right greater than left). There is no significant pericardial fluid, thickening or pericardial calcification. There is aortic atherosclerosis, as well as atherosclerosis of the great vessels of the mediastinum and the coronary arteries, including calcified atherosclerotic plaque in the left main, left anterior descending, left circumflex and right coronary arteries. Thickening calcification of the aortic valve and calcifications of the mitral annulus. Right-sided pacemaker device in place with lead tips terminating in the right atrial appendage and right ventricle. Mediastinum/Nodes: No pathologically enlarged mediastinal or hilar lymph nodes. Moderate-sized hiatal hernia. No axillary lymphadenopathy. Lungs/Pleura: Patchy multifocal areas of ground-glass attenuation, septal thickening and frank airspace consolidation in the lungs bilaterally (right much greater than left). The densest areas of consolidation are in the right upper and right lower lobe where there is some subpleural sparing. Moderate right and small left pleural effusions lying dependently. Upper Abdomen: Aortic atherosclerosis. Musculoskeletal: There are no aggressive appearing lytic or blastic lesions noted in the visualized portions of the skeleton. IMPRESSION: 1. Tiny nonobstructive segmental sized embolus in the right lower lobe pulmonary arteries, as above. 2. Severe multilobar bilateral pneumonia (right greater than left), compatible with reported COVID-19 infection. 3. Cardiomegaly with  biatrial dilatation. 4. Moderate right and small left pleural effusions. 5. Aortic atherosclerosis, in addition to left main and 3 vessel coronary artery disease. 6. There are calcifications of the aortic  valve and mitral annulus. Echocardiographic correlation for evaluation of potential valvular dysfunction may be warranted if clinically indicated. Aortic Atherosclerosis (ICD10-I70.0). Electronically Signed   By: Vinnie Langton M.D.   On: 10/17/2019 10:26   CT Chest High Resolution  Result Date: 10/17/2019 CLINICAL DATA:  85 year old male with history of respiratory failure. COVID-19 infection with increasing oxygen requirements. EXAM: CT CHEST WITHOUT CONTRAST CT ANGIOGRAPHY CHEST WITH CONTRAST TECHNIQUE: Multidetector CT imaging of the chest was performed following the standard protocol without intravenous contrast. High resolution imaging of the lungs, as well as inspiratory and expiratory imaging, was performed. Additional multidetector CT imaging of the chest was performed using the standard protocol during bolus administration of intravenous contrast. Multiplanar CT image reconstructions and MIPs were obtained to evaluate the vascular anatomy. COMPARISON:  Chest CTA 03/03/2013. FINDINGS: Cardiovascular: Although study is limited by considerable patient respiratory motion, there is a small nonocclusive central filling defect in a right lower lobe segmental sized branch (axial image 129 of series 7) of the pulmonary arteries, indicative of pulmonary embolism. No other larger more central or lobar filling defect is noted. Heart size is enlarged with biatrial dilatation (right greater than left). There is no significant pericardial fluid, thickening or pericardial calcification. There is aortic atherosclerosis, as well as atherosclerosis of the great vessels of the mediastinum and the coronary arteries, including calcified atherosclerotic plaque in the left main, left anterior descending, left circumflex  and right coronary arteries. Thickening calcification of the aortic valve and calcifications of the mitral annulus. Right-sided pacemaker device in place with lead tips terminating in the right atrial appendage and right ventricle. Mediastinum/Nodes: No pathologically enlarged mediastinal or hilar lymph nodes. Moderate-sized hiatal hernia. No axillary lymphadenopathy. Lungs/Pleura: Patchy multifocal areas of ground-glass attenuation, septal thickening and frank airspace consolidation in the lungs bilaterally (right much greater than left). The densest areas of consolidation are in the right upper and right lower lobe where there is some subpleural sparing. Moderate right and small left pleural effusions lying dependently. Upper Abdomen: Aortic atherosclerosis. Musculoskeletal: There are no aggressive appearing lytic or blastic lesions noted in the visualized portions of the skeleton. IMPRESSION: 1. Tiny nonobstructive segmental sized embolus in the right lower lobe pulmonary arteries, as above. 2. Severe multilobar bilateral pneumonia (right greater than left), compatible with reported COVID-19 infection. 3. Cardiomegaly with biatrial dilatation. 4. Moderate right and small left pleural effusions. 5. Aortic atherosclerosis, in addition to left main and 3 vessel coronary artery disease. 6. There are calcifications of the aortic valve and mitral annulus. Echocardiographic correlation for evaluation of potential valvular dysfunction may be warranted if clinically indicated. Aortic Atherosclerosis (ICD10-I70.0). Electronically Signed   By: Vinnie Langton M.D.   On: 10/17/2019 10:26   DG Swallowing Func-Speech Pathology  Result Date: 10/17/2019 Objective Swallowing Evaluation: Type of Study: MBS-Modified Barium Swallow Study  Patient Details Name: Robert Cuevas MRN: [id number] Date of Birth: August 18, 1930 Today's Date: 10/17/2019 Time: SLP Start Time (ACUTE ONLY): [id number] -SLP Stop Time (ACUTE ONLY): [id number] SLP Time  Calculation (min) (ACUTE ONLY): 28 min Past Medical History: Past Medical History: Diagnosis Date . Cancer ([place])   skin cancer removed from head . Complete heart block ([place])   a. s/p gen change 07/2011. b. Pacer pocket infx 02/2013 - s/p extraction, temp perm placement, then eventual implantation of new St. Jude pacemaker 03/04/13. . Coronary artery calcification   Seen on CT 02/2013. . High cholesterol  . HOH (hard of hearing)  . Hyperglycemia  . Hypertension  .  Pacemaker- St Jude  03/06/2013  Extracted 5/14 Reimplant 5/14  . Paroxysmal atrial fibrillation ([place]) 01/04/2015 Past Surgical History: Past Surgical History: Procedure Laterality Date . GENERATOR REMOVAL N/A 03/02/2013  Procedure: GENERATOR REMOVAL;  Surgeon: Evans Lance, MD;  Location: [facility];  Service: Cardiovascular;  Laterality: N/A; . ICD LEAD REMOVAL N/A 03/02/2013  Procedure: ICD LEAD REMOVAL;  Surgeon: Evans Lance, MD;  Location: [facility];  Service: Cardiovascular;  Laterality: N/A; . INGUINAL HERNIA REPAIR Bilateral  . PACEMAKER GENERATOR CHANGE N/A 09/19/2011  Procedure: PACEMAKER GENERATOR CHANGE;  Surgeon: Sanda Klein, MD;  Location: [facility];  Service: Cardiovascular;  Laterality: N/A; . PACEMAKER INSERTION   . PACEMAKER LEAD REMOVAL N/A 03/02/2013  Procedure: PACEMAKER LEAD REMOVAL;  Surgeon: Evans Lance, MD;  Location: [facility];  Service: Cardiovascular;  Laterality: N/A; . PERMANENT PACEMAKER INSERTION N/A 03/04/2013  Procedure: PERMANENT PACEMAKER INSERTION;  Surgeon: Evans Lance, MD;  Location: [facility] C[facility];  Service: Cardiovascular;  Laterality: N/A; HPI: Pt is an 85 year old man presenting with recurrent hypoxemic respiratory failure after having COVID-19 in early December. Pt was evaluated by SLP on 12/22 with no overt s/s of aspiration noted clinically. PMH: afib, HTN, HLD, pacemaker-dependence  Subjective: denies any trouble swallowing Assessment / Plan / Recommendation CHL IP CLINICAL IMPRESSIONS 10/17/2019 Clinical Impression  Pt  has a mild oropharyngeal dysphagia with silent aspiration of thin liquids that appears to be primarily related to timing issues and mild, generalized weakness. His oral phase is mildly prolonged, particularly his mastication even though he has his dentures in place for the study. There is mild oral/lingual residue after the swallow as well as mild-moderate residue across the base of tongue, valleculae, and pyriform sinuses. When pt takes a small, single sip of thin liquid he does well; however, he typically drinks with more impulsivity, and when he does, this ultimately results in silent aspiration. He can achieve adequate LVC, but when he already has pharyngeal residue present and his timing is minimally off, he does not get his laryngeal vestibule sealed in time. Penetration occurs with nectar thick liquids when consumed in the same fashion, but aspiration is not observed. Pt can clear thin and nectar thick liquids from his laryngeal vestibule well with a cued cough or throat clear. Recommend Dys 2 diet to facilitate oropharyngeal clearance and small, single cup sips of nectar thick liquids while pt's respiratory status is more compromised. Will f/u with pt to try to advance diet if strategies can be consistently implemented as his overall strength and respirations improve.  SLP Visit Diagnosis Dysphagia, oropharyngeal phase (R13.12) Attention and concentration deficit following -- Frontal lobe and executive function deficit following -- Impact on safety and function Mild aspiration risk;Moderate aspiration risk   CHL IP TREATMENT RECOMMENDATION 10/17/2019 Treatment Recommendations Therapy as outlined in treatment plan below   Prognosis 10/17/2019 Prognosis for Safe Diet Advancement Fair Barriers to Reach Goals -- Barriers/Prognosis Comment -- CHL IP DIET RECOMMENDATION 10/17/2019 SLP Diet Recommendations Dysphagia 2 (Fine chop) solids;Nectar thick liquid Liquid Administration via Cup;No straw Medication  Administration Crushed with puree Compensations Minimize environmental distractions;Slow rate;Small sips/bites;Other (Comment);Clear throat intermittently Postural Changes Remain semi-upright after after feeds/meals (Comment);Seated upright at 90 degrees   CHL IP OTHER RECOMMENDATIONS 10/17/2019 Recommended Consults -- Oral Care Recommendations Oral care BID Other Recommendations --   CHL IP FOLLOW UP RECOMMENDATIONS 10/17/2019 Follow up Recommendations Skilled Nursing facility   [facility] IP FREQUENCY AND DURATION 10/17/2019 Speech Therapy Frequency (ACUTE ONLY) min  2x/week Treatment Duration 2 weeks      CHL IP ORAL PHASE 10/17/2019 Oral Phase Impaired Oral - Pudding Teaspoon -- Oral - Pudding Cup -- Oral - Honey Teaspoon -- Oral - Honey Cup -- Oral - Nectar Teaspoon -- Oral - Nectar Cup Delayed oral transit;Decreased bolus cohesion Oral - Nectar Straw -- Oral - Thin Teaspoon -- Oral - Thin Cup Delayed oral transit;Decreased bolus cohesion Oral - Thin Straw Delayed oral transit;Decreased bolus cohesion Oral - Puree Delayed oral transit;Decreased bolus cohesion;Lingual/palatal residue Oral - Mech Soft Delayed oral transit;Decreased bolus cohesion;Lingual/palatal residue;Impaired mastication Oral - Regular -- Oral - Multi-Consistency -- Oral - Pill -- Oral Phase - Comment --  CHL IP PHARYNGEAL PHASE 10/17/2019 Pharyngeal Phase Impaired Pharyngeal- Pudding Teaspoon -- Pharyngeal -- Pharyngeal- Pudding Cup -- Pharyngeal -- Pharyngeal- Honey Teaspoon -- Pharyngeal -- Pharyngeal- Honey Cup -- Pharyngeal -- Pharyngeal- Nectar Teaspoon -- Pharyngeal -- Pharyngeal- Nectar Cup Reduced tongue base retraction;Reduced pharyngeal peristalsis;Pharyngeal residue - valleculae;Pharyngeal residue - pyriform;Penetration/Aspiration during swallow Pharyngeal Material enters airway, remains ABOVE vocal cords and not ejected out Pharyngeal- Nectar Straw -- Pharyngeal -- Pharyngeal- Thin Teaspoon -- Pharyngeal -- Pharyngeal- Thin Cup Reduced  tongue base retraction;Reduced pharyngeal peristalsis;Pharyngeal residue - valleculae;Pharyngeal residue - pyriform;Penetration/Aspiration during swallow Pharyngeal Material enters airway, passes BELOW cords without attempt by patient to eject out (silent aspiration) Pharyngeal- Thin Straw Reduced tongue base retraction;Reduced pharyngeal peristalsis;Pharyngeal residue - valleculae;Pharyngeal residue - pyriform;Penetration/Aspiration during swallow Pharyngeal Material enters airway, passes BELOW cords without attempt by patient to eject out (silent aspiration) Pharyngeal- Puree Reduced tongue base retraction;Reduced pharyngeal peristalsis;Pharyngeal residue - valleculae;Pharyngeal residue - pyriform Pharyngeal -- Pharyngeal- Mechanical Soft Reduced tongue base retraction;Reduced pharyngeal peristalsis;Pharyngeal residue - valleculae;Pharyngeal residue - pyriform Pharyngeal -- Pharyngeal- Regular -- Pharyngeal -- Pharyngeal- Multi-consistency -- Pharyngeal -- Pharyngeal- Pill -- Pharyngeal -- Pharyngeal Comment --  CHL IP CERVICAL ESOPHAGEAL PHASE 10/17/2019 Cervical Esophageal Phase WFL Pudding Teaspoon -- Pudding Cup -- Honey Teaspoon -- Honey Cup -- Nectar Teaspoon -- Nectar Cup -- Nectar Straw -- Thin Teaspoon -- Thin Cup -- Thin Straw -- Puree -- Mechanical Soft -- Regular -- Multi-consistency -- Pill -- Cervical Esophageal Comment -- Osie Bond., M.A. [place] Pager [phone] Office [phone] 10/17/2019, 5:03 PM              ECHOCARDIOGRAM COMPLETE  Result Date: 10/18/2019   ECHOCARDIOGRAM REPORT   Patient Name:   Robert Cuevas Date of Exam: 10/18/2019 Medical Rec #:  [id number]      Height:       68.0 in Accession #:    [id number]     Weight:       126.1 lb Date of Birth:  1929/12/20      BSA:          1.68 m Patient Age:    36 years       BP:           138/53 mmHg Patient Gender: M              HR:           70 bpm. Exam Location:  Inpatient Procedure: 2D Echo, Cardiac  Doppler and Color Doppler Indications:    Dyspnea 786.09  History:        Patient has prior history of Echocardiogram examinations, most                 recent 01/10/2015. Pacemaker, Arrythmias:Atrial Fibrillation,  Signs/Symptoms:Hypotension; Risk Factors:Diabetes. Sepsis.                 Complete heart block.  Sonographer:    Paulita Fujita RDCS Referring Phys: [id number] [place]  Sonographer Comments: Suboptimal parasternal window. IMPRESSIONS  1. Left ventricular ejection fraction, by visual estimation, is 45 to 50%. The left ventricle has normal function. There is no left ventricular hypertrophy.  2. The left ventricle demonstrates regional wall motion abnormalities.  3. Interventricular septal dyssynchrony and hypokinesis consistent with RV pacing.  4. Global right ventricle has normal systolic function.The right ventricular size is moderately enlarged. No increase in right ventricular wall thickness.  5. Left atrial size was normal.  6. Right atrial size was normal.  7. Mild mitral annular calcification.  8. The mitral valve is normal in structure. No evidence of mitral valve regurgitation. No evidence of mitral stenosis.  9. The tricuspid valve is normal in structure. 10. The aortic valve is normal in structure. Aortic valve regurgitation is mild. Mild aortic valve stenosis. 11. The pulmonic valve was normal in structure. Pulmonic valve regurgitation is not visualized. 12. Moderately elevated pulmonary artery systolic pressure. 13. A pacer wire is visualized. 14. The inferior vena cava is normal in size with <50% respiratory variability, suggesting right atrial pressure of 8 mmHg. FINDINGS  Left Ventricle: Left ventricular ejection fraction, by visual estimation, is 45 to 50%. The left ventricle has normal function. The left ventricle demonstrates regional wall motion abnormalities. There is no left ventricular hypertrophy. Normal left atrial pressure. Interventricular septal  dyssynchrony and hypokinesis consistent with RV pacing. Right Ventricle: The right ventricular size is moderately enlarged. No increase in right ventricular wall thickness. Global RV systolic function is has normal systolic function. The tricuspid regurgitant velocity is 3.01 m/s, and with an assumed right atrial pressure of 8 mmHg, the estimated right ventricular systolic pressure is moderately elevated at 44.2 mmHg. Left Atrium: Left atrial size was normal in size. Right Atrium: Right atrial size was normal in size Pericardium: There is no evidence of pericardial effusion. Mitral Valve: The mitral valve is normal in structure. Mild mitral annular calcification. No evidence of mitral valve regurgitation. No evidence of mitral valve stenosis by observation. Tricuspid Valve: The tricuspid valve is normal in structure. Tricuspid valve regurgitation moderate-severe. Aortic Valve: The aortic valve is normal in structure.. There is moderate thickening and moderate calcification of the aortic valve. Aortic valve regurgitation is mild. Mild aortic stenosis is present. Moderate aortic valve annular calcification. There is moderate thickening of the aortic valve. There is moderate calcification of the aortic valve. Pulmonic Valve: The pulmonic valve was normal in structure. Pulmonic valve regurgitation is not visualized. Pulmonic regurgitation is not visualized. Aorta: The aortic root, ascending aorta and aortic arch are all structurally normal, with no evidence of dilitation or obstruction. Venous: The inferior vena cava is normal in size with less than 50% respiratory variability, suggesting right atrial pressure of 8 mmHg. IAS/Shunts: No atrial level shunt detected by color flow Doppler. There is no evidence of a patent foramen ovale. No ventricular septal defect is seen or detected. There is no evidence of an atrial septal defect. Additional Comments: A pacer wire is visualized.  LEFT VENTRICLE PLAX 2D LVIDd:          3.60 cm LVIDs:         2.70 cm LV PW:         1.00 cm LV IVS:        1.00 cm  LVOT diam:     1.70 cm LV SV:         27 ml LV SV Index:   16.64 LVOT Area:     2.27 cm  LV Volumes (MOD) LV area d, A2C:    20.20 cm LV area d, A4C:    18.90 cm LV area s, A2C:    16.60 cm LV area s, A4C:    14.60 cm LV major d, A2C:   5.61 cm LV major d, A4C:   6.32 cm LV major s, A2C:   5.64 cm LV major s, A4C:   5.57 cm LV vol d, MOD A2C: 60.2 ml LV vol d, MOD A4C: 47.8 ml LV vol s, MOD A2C: 39.2 ml LV vol s, MOD A4C: 32.4 ml LV SV MOD A2C:     21.0 ml LV SV MOD A4C:     47.8 ml LV SV MOD BP:      21.0 ml RIGHT VENTRICLE RV S prime:     13.60 cm/s TAPSE (M-mode): 2.3 cm LEFT ATRIUM           Index       RIGHT ATRIUM           Index LA diam:      3.60 cm 2.14 cm/m  RA Area:     14.10 cm LA Vol (A2C): 34.5 ml 20.54 ml/m RA Volume:   39.80 ml  23.69 ml/m LA Vol (A4C): 47.5 ml 28.27 ml/m  AORTIC VALVE LVOT Vmax:   71.80 cm/s LVOT Vmean:  47.100 cm/s LVOT VTI:    0.113 m  AORTA Ao Root diam: 3.40 cm TRICUSPID VALVE TR Peak grad:   36.2 mmHg TR Vmax:        301.00 cm/s  SHUNTS Systemic VTI:  0.11 m Systemic Diam: 1.70 cm  Skeet Latch MD Electronically signed by Skeet Latch MD Signature Date/Time: 10/18/2019/2:46:13 PM    Final     Medications:   . apixaban  5 mg Oral BID  . atorvastatin  20 mg Oral Daily  . chlorhexidine  15 mL Mouth Rinse BID  . Chlorhexidine Gluconate Cloth  6 each Topical Daily  . cholecalciferol  1,000 Units Oral Daily  . collagenase  1 application Topical Daily  . furosemide  40 mg Intravenous BID  . insulin aspart  0-15 Units Subcutaneous TID WC  . mouth rinse  15 mL Mouth Rinse q12n4p  . methylPREDNISolone (SOLU-MEDROL) injection  60 mg Intravenous Q6H  . metoprolol tartrate  50 mg Oral BID  . omega-3 acid ethyl esters  1 g Oral BID   Continuous Infusions: . ceFEPime (MAXIPIME) IV Stopped (10/18/19 2212)  . potassium chloride 100 mL/hr at 10/19/19 0654     LOS: 2 days     This patient is critically ill and I spent 40 minutes with the patient with greater than 50% in face-to-face interaction and therefore this is a level 3 visit.  Margreta Journey Tiarrah Saville  [facility] Pager [phone].   [facility] How to contact the [facility] Attending or Consulting provider [place] or covering provider during after hours [place], for this patient?  1. Check the care team in [facility] and look for a) attending/consulting [facility] provider listed and b) the [facility] team listed 2. Log into [email or web address] and use [place]'s universal password to access. If you do not have the password, please contact the hospital operator. 3. Locate the [facility] provider you are looking for under [facility]  and page to a number that you can be directly reached. 4. If you still have difficulty reaching the provider, please page the [facility] (Director on Call) for the Hospitalists listed on amion for assistance.  10/19/2019, 6:59 AM

## 2019-10-19 NOTE — Progress Notes (Signed)
NAME:  Robert Cuevas, MRN:  [id number], DOB:  1930-07-27, LOS: 2 ADMISSION DATE:  10/17/2019, CONSULTATION DATE:  12/28 REFERRING MD:  Tonye Royalty, MD, CHIEF COMPLAINT:  Hypoxemic respiratory failure  Brief History   85 year old man with history of afib on AC, HTN, HLD, pacemaker-dependence presenting with recurrent hypoxemic respiratory failure after having COVID in early December.  Tested positive on December 2.  History as below, confirmed with patient:  " Was admitted to [facility] on 09/25/2019 -10/08/2019, treated withIV Remdisivir and Decadron,discharged on 1 L nasal cannula toSNFand again on 10/11/2019-12/27/2020withsevere sepsis and hypoxia from aspiration pneumonia, also with UTI.Robert Cuevas was discharged with 2 L nasal cannula and instructions to f/u speech therapy-his nurse at [facility] reports he was satting 84% on 2 L nasal cannula. Increased him to 5 L and his sats wereonlly88%. Instructed by nurse practitioner to call [facility] sent here.Patient denies feeling short of breath. He has no complaints at this time. No chest pain. States he was sent here because of low oxygen levels.'  Denies chest pain, productive cough, fevers, chills, orthopnea. Seen by speech last admission rec'd for puree with thin liquids.  Consults:  PCCM  Procedures:  12/28 thoracentesis right chest therapeutic 700cc  Micro Data:  12/28Pleural fluid culture no WBC, no organisms 12/22 Urine cx PSA and E.Faecalis - pansensitive Antimicrobials:  Cefepime 12/28 >>   Interim history/subjective:  No overnight issues. Left shoulder pain today. Having difficulty lifting his left arm over his head without pain.  Objective   Blood pressure (!) 125/53, pulse 75, temperature 97.7 F (36.5 C), temperature source Oral, resp. rate 15, height 5\' 8"  (1.727 m), weight 57.2 kg, SpO2 95 %.        Intake/Output Summary (Last 24 hours) at 10/19/2019 0850 Last data filed  at 10/19/2019 0654 Gross per 24 hour  Intake 206.91 ml  Output 1500 ml  Net -1293.09 ml   Filed Weights   10/17/19 0304 10/17/19 1800  Weight: 62.1 kg 57.2 kg    Examination: General: elderly, chronically ill appearing HENT: temporal wasting, dry mucus membranes Lungs: right lung diminished, crackles on the left. No cough. No wheezes. No increased wob.  Cardiovascular: RRR, no mrg Abdomen: scaphoid, soft Extremities: thin, no edema Neuro: awake and alert, follows commands MSK: sacral ulcer Lines: PIV  Assessment & Plan:  85 yo gentleman with recent hospitalization for COVID pneumonia now with recurrent respiratory failure:  Acute hypoxemic Respiratory Failure COVID 19 Pneumonia - now 21 days s/p last positive test no longer requires contact/droplet isolation Severe Protein Calorie Malnutrition Body mass index is 19.17 kg/m. Pressure Ulcer - POA Atrial Fibrillation, rate controlled on eliquis Steroid Induced Hyperglycemia Acute on chronic Heart failure reduced ejection fraction - decompensated UTI  Plan: Maintain oxygen and wean as tolerated flow and FiO2 goal sats over 88%. Down to [facility] Maintain Cefepime for PSA and E.Faecalis UTI - consider 5 day course, defer to primary Continue steroids for post covid state - will transition to q12 today. Maintain net negative fluid balance with lasix; however his BNP is minimally elevated and I feel he is approaching euvolemia. Will back down to BID lasix with plans to transition to po tomorrow?  Would avoid IVF and albumin as able and tolerate slightly lower Blood pressures and uop to accomplish this if necessary Discontinue metformin, increase SSI as necessary for glucose management.  Continue metoprolol for rate control Transferring to RNF with tele. Patient's family apparently wants to take  him home - not to nursing home.  Pulmonary will follow.   Best practice:  Diet: Dysphagia 2 diet with nectar thick liquids per MBS on 12/28  Pain/Anxiety/Delirium protocol (if indicated): minimize sedating medications VAP protocol (if indicated): n/a DVT prophylaxis: therapeutic AC GI prophylaxis: n/a Glucose control: steroid induced hyperglycemia, increase SSI as needed Foley none - condom cath Mobility: OOB with PT/OT Code Status: DNR Family Communication: patient updated at bedside Disposition: Patient is stable for RNF.   Labs/Imaging  CT Chest 12/28 with dense central peribronchovascular consolidations which spare the periphery, air bronchograms. Large right sided pleural effusion. Left lung relatively spared.   CBC: Recent Labs  Lab 10/14/19 0615 10/15/19 0430 10/16/19 0344 10/17/19 0421 10/18/19 0147  WBC 8.1 6.4 5.9 7.1 6.7  NEUTROABS  --   --   --  6.3  --   HGB 15.9 15.4 15.3 15.9 15.9  HCT 47.3 45.8 45.9 47.7 47.6  MCV 94.2 93.9 94.3 95.4 94.3  PLT 108* 127* 151 150 0000000    Basic Metabolic Panel: Recent Labs  Lab 10/13/19 0330 10/14/19 0615 10/15/19 0430 10/17/19 0421 10/17/19 0822 10/18/19 0147 10/19/19 0126  NA 135 135 136 136  --  139 139  K 3.2* 4.5 4.4 3.8  --  3.6 2.9*  CL 103 101 102 104  --  100 97*  CO2 24 23 25 22   --  25 30  GLUCOSE 144* 111* 103* 145*  --  209* 233*  BUN 13 12 11 9   --  10 17  CREATININE 0.48* 0.56* 0.59* 0.51*  --  0.49* 0.47*  CALCIUM 8.0* 8.2* 8.2* 8.3*  --  8.7* 8.8*  MG 1.8  --   --   --  1.6* 1.8 1.8  PHOS  --   --   --   --  3.4  --   --    GFR: Estimated Creatinine Clearance: 52.6 mL/min (A) (by C-G formula based on SCr of 0.47 mg/dL (L)). Recent Labs  Lab 10/13/19 0330 10/15/19 0430 10/16/19 0344 10/17/19 0421 10/18/19 0147  PROCALCITON 0.14 <0.10  --   --  <0.10  WBC 10.1 6.4 5.9 7.1 6.7    Liver Function Tests: No results for input(s): AST, ALT, ALKPHOS, BILITOT, PROT, ALBUMIN in the last 168 hours. No results for input(s): LIPASE, AMYLASE in the last 168 hours. No results for input(s): AMMONIA in the last 168 hours.  ABG    Component  Value Date/Time   PHART 7.435 10/17/2019 0826   PCO2ART 34.7 10/17/2019 0826   PO2ART 75.6 (L) 10/17/2019 0826   HCO3 22.9 10/17/2019 0826   ACIDBASEDEF 0.2 10/17/2019 0826   O2SAT 94.7 10/17/2019 0826     Coagulation Profile: No results for input(s): INR, PROTIME in the last 168 hours.  Cardiac Enzymes: No results for input(s): CKTOTAL, CKMB, CKMBINDEX, TROPONINI in the last 168 hours.  HbA1C: Hgb A1c MFr Bld  Date/Time Value Ref Range Status  09/25/2019 04:52 AM 8.4 (H) 4.8 - 5.6 % Final    Comment:    (NOTE) Pre diabetes:          5.7%-6.4% Diabetes:              >6.4% Glycemic control for   <7.0% adults with diabetes     CBG: Recent Labs  Lab 10/17/19 2207 10/18/19 0758 10/18/19 1132 10/18/19 1620 10/18/19 2105  GLUCAP 203* 187* 199* 221* 198*    Critical care time:   The patient is critically  ill with multiple organ systems failure and requires high complexity decision making for assessment and support, frequent evaluation and titration of therapies, application of advanced monitoring technologies and extensive interpretation of multiple databases.   Critical Care Time devoted to patient care services described in this note is 35 minutes. This time reflects the time of my personal involvement. This critical care time does not reflect separately billable procedures or procedure time, teaching time or supervisory time of PA/NP/Med student/Med Resident etc but could involve care discussion time.  Leone [facility] 10/19/2019 8:50 AM  Pager: [phone] After hours pager: [phone]

## 2019-10-20 ENCOUNTER — Encounter (HOSPITAL_COMMUNITY): Payer: Self-pay | Admitting: Internal Medicine

## 2019-10-20 DIAGNOSIS — J9621 Acute and chronic respiratory failure with hypoxia: Secondary | ICD-10-CM

## 2019-10-20 DIAGNOSIS — Z8679 Personal history of other diseases of the circulatory system: Secondary | ICD-10-CM

## 2019-10-20 DIAGNOSIS — R799 Abnormal finding of blood chemistry, unspecified: Secondary | ICD-10-CM | POA: Diagnosis present

## 2019-10-20 LAB — BASIC METABOLIC PANEL
Anion gap: 11 (ref 5–15)
BUN: 35 mg/dL — ABNORMAL HIGH (ref 8–23)
CO2: 31 mmol/L (ref 22–32)
Calcium: 8.7 mg/dL — ABNORMAL LOW (ref 8.9–10.3)
Chloride: 96 mmol/L — ABNORMAL LOW (ref 98–111)
Creatinine, Ser: 0.53 mg/dL — ABNORMAL LOW (ref 0.61–1.24)
GFR calc Af Amer: >60 mL/min (ref >60)
GFR calc non Af Amer: >60 mL/min (ref >60)
Glucose, Bld: 289 mg/dL — ABNORMAL HIGH (ref 70–99)
Potassium: 3.5 mmol/L (ref 3.5–5.1)
Sodium: 138 mmol/L (ref 135–145)

## 2019-10-20 LAB — GLUCOSE, CAPILLARY
Glucose-Capillary: 281 mg/dL — ABNORMAL HIGH (ref 70–99)
Glucose-Capillary: 268 mg/dL — ABNORMAL HIGH (ref 70–99)
Glucose-Capillary: 394 mg/dL — ABNORMAL HIGH (ref 70–99)
Glucose-Capillary: 278 mg/dL — ABNORMAL HIGH (ref 70–99)
Glucose-Capillary: 327 mg/dL — ABNORMAL HIGH (ref 70–99)

## 2019-10-20 MED ORDER — POTASSIUM CHLORIDE 20 MEQ PO PACK
40.0000 meq | PACK | Freq: Once | ORAL | Status: AC
  Administered 2019-10-20: 40 meq via ORAL
  Filled 2019-10-20: qty 2

## 2019-10-20 MED ORDER — METHYLPREDNISOLONE SODIUM SUCC 40 MG IJ SOLR
40.0000 mg | Freq: Two times a day (BID) | INTRAMUSCULAR | Status: DC
  Administered 2019-10-20 – 2019-10-21 (×2): 40 mg via INTRAVENOUS
  Filled 2019-10-20 (×2): qty 1

## 2019-10-20 MED ORDER — INSULIN ASPART 100 UNIT/ML ~~LOC~~ SOLN
0.0000 [IU] | Freq: Every day | SUBCUTANEOUS | Status: DC
  Administered 2019-10-20: 4 [IU] via SUBCUTANEOUS
  Administered 2019-10-21: 23:00:00 2 [IU] via SUBCUTANEOUS
  Administered 2019-10-22 – 2019-10-23 (×2): 4 [IU] via SUBCUTANEOUS
  Administered 2019-10-24: 22:00:00 3 [IU] via SUBCUTANEOUS

## 2019-10-20 MED ORDER — INSULIN ASPART 100 UNIT/ML ~~LOC~~ SOLN
6.0000 [IU] | Freq: Three times a day (TID) | SUBCUTANEOUS | Status: DC
  Administered 2019-10-20: 6 [IU] via SUBCUTANEOUS

## 2019-10-20 MED ORDER — INSULIN GLARGINE 100 UNIT/ML ~~LOC~~ SOLN
10.0000 [IU] | Freq: Every day | SUBCUTANEOUS | Status: DC
  Administered 2019-10-20: 10 [IU] via SUBCUTANEOUS
  Filled 2019-10-20 (×2): qty 0.1

## 2019-10-20 MED ORDER — SODIUM CHLORIDE 0.9 % IV SOLN: INTRAVENOUS | Status: DC

## 2019-10-20 MED ORDER — INSULIN ASPART 100 UNIT/ML ~~LOC~~ SOLN
0.0000 [IU] | Freq: Three times a day (TID) | SUBCUTANEOUS | Status: DC
  Administered 2019-10-20: 11 [IU] via SUBCUTANEOUS
  Administered 2019-10-21: 12:00:00 20 [IU] via SUBCUTANEOUS
  Administered 2019-10-21: 08:00:00 11 [IU] via SUBCUTANEOUS
  Administered 2019-10-22: 08:00:00 4 [IU] via SUBCUTANEOUS
  Administered 2019-10-23: 3 [IU] via SUBCUTANEOUS
  Administered 2019-10-23: 12:00:00 20 [IU] via SUBCUTANEOUS
  Administered 2019-10-23: 7 [IU] via SUBCUTANEOUS
  Administered 2019-10-24: 18:00:00 4 [IU] via SUBCUTANEOUS
  Administered 2019-10-24 – 2019-10-25 (×2): 7 [IU] via SUBCUTANEOUS

## 2019-10-20 MED ORDER — POTASSIUM CHLORIDE CRYS ER 10 MEQ PO TBCR
10.0000 meq | EXTENDED_RELEASE_TABLET | Freq: Two times a day (BID) | ORAL | Status: DC
  Administered 2019-10-20 – 2019-10-21 (×3): 10 meq via ORAL
  Filled 2019-10-20 (×3): qty 1

## 2019-10-20 NOTE — Care Management Important Message (Signed)
Important Message  Patient Details IM Letter given to Rhea Pink SW to present to the Patient Name: Robert Cuevas MRN: [id number] Date of Birth: 09/10/1930   Medicare Important Message Given:  Yes     Kerin Salen 10/20/2019, 1:02 PM

## 2019-10-20 NOTE — Plan of Care (Signed)
  Problem: Health Behavior/Discharge Planning: Goal: Ability to manage health-related needs will improve Outcome: Progressing   Problem: Clinical Measurements: Goal: Respiratory complications will improve Outcome: Progressing Goal: Cardiovascular complication will be avoided Outcome: Progressing   Problem: Activity: Goal: Risk for activity intolerance will decrease Outcome: Progressing   Problem: Safety: Goal: Ability to remain free from injury will improve Outcome: Progressing   Problem: Skin Integrity: Goal: Risk for impaired skin integrity will decrease Outcome: Progressing

## 2019-10-20 NOTE — Progress Notes (Signed)
Progress Note    Robert Cuevas  [id number] DOB: 1930/05/18  DOA: 10/17/2019 PCP: [facility]    Brief Narrative:   Chief complaint: Follow-up hypoxia/shortness of breath  Medical records reviewed and are as summarized below:  Robert Cuevas is an 85 y.o. male with a PMH of hypertension, hyperlipidemia, PAF on chronic Eliquis, complete heart block status post pacemaker, type 2 diabetes who was admitted from his SNF with hypoxia in the setting of recently being diagnosed with COVID-19.  He was previously hospitalized 09/25/2019-10/08/2019 at which time he received a treatment course of IV remdesivir and Decadron, was subsequently readmitted 10/11/2019-10/16/2019 with severe sepsis (POA at that time) and hypoxia secondary to aspiration pneumonia/UTI.  Readmitted a third time 10/17/19 with ongoing hypoxia requiring increasing oxygen.  CT angiogram showed nonobstructive segmental pulmonary emboli in the right lower lobe and severe multilobar pneumonia with moderate right and small left pleural effusions.  Assessment/Plan:   Principal Problem:   Acute on chronic respiratory failure with hypoxia (HCC) secondary to multilobar pneumonia in the setting of recent COVID-19 associated pneumonia and aspiration complicated by pulmonary emboli The patient presented with pneumonia but not sepsis on admission.  At this time, primary driver of the patient's pneumonia is felt to be secondary bacterial pneumonia in the setting of aspiration and he is therefore being covered for healthcare associated pathogens including Pseudomonas (had prior pseudomonal UTI).  Continue cefepime.  He also continues on steroids and supplemental oxygen.  Status post speech therapy evaluation with dysphagia 1 diet recommended.  With regard to the pulmonary emboli, the patient remains on Eliquis.  Given the patient's recurrent readmissions and persistent hypoxia, a palliative care consultation was performed  10/19/2019 at this time, the patient and family continue to want the full scope of treatment with the hope of discharging home.  Blood cultures negative.  Wean oxygen as tolerated, continues on supplemental oxygen intermittently and drops his oxygen saturation with any activity.  Will likely need home oxygen.  Active Problems:   Orthostatic hypotension/Elevated BUN Patient dropped his blood pressure to 80s/40s yesterday and his BUN is noted to be elevated today, consistent with dehydration. Will resume IV fluids.    Enterococcus/Pseudomonas UTI Remains on cefepime which should cover.    Hypokalemia Normalized today but on the low side of normal so we will continue supplementation.    Bilateral pleural effusions Underwent right-sided thoracentesis 10/17/2019 with 700 cc of fluid removed.  Left-sided pleural effusion is small.  Pleural fluid cultures were negative.     Dysphagia Evaluated by speech therapy, continue dysphagia 1 diet.    Paroxysmal atrial fibrillation (HCC)/Long term (current) use of anticoagulants/status post pacemaker Continue metoprolol.  2D echo shows EF 45-50% with regional wall motion abnormalities.    COVID-19 virus infection No longer felt to be shedding virus and therefore is off isolation.    Diabetes mellitus type II, non insulin dependent ([place]), uncontrolled Hemoglobin A1c 8.4% on 09/25/2019.  Metformin on hold.  Chronic currently being managed with moderate scale SSI and 4 units of meal coverage.  CBGs 198-281.  Add 10 units of Lantus.  Needs better glycemic control.  Steroids likely a complicating factor.    Pressure ulcer, stage II to sacrum, present prior to admission Continue wound care per nursing.    Hyperlipidemia Continue Lipitor.    Severe protein calorie malnutrition/underweight  Body mass index is 19.17 kg/m.  Dietitian consultation requested and performed.  Noted to have severe fat depletion and severe  muscle depletion consistent with severe  malnutrition.  Continue supplements as ordered.    Family Communication/Anticipated D/C date and plan/Code Status   DVT prophylaxis: On chronic Eliquis. Code Status: DNR.  Family Communication: Spouse updated by telephone. Disposition Plan: Transfer to floor. Likely stable for d/c in the next 24-48 hours, but slow progress limiting stability for discharge since he is going home.  TOC consult for arrangement of home health services and equipment.   Medical Consultants:    Critical care  Palliative care   Anti-Infectives:    Cefepime 10/17/2019--->  Subjective:   Robert Cuevas denies SOB, nausea.  Appetite is fair.  No complaints of pain.  Remains very weak and dropped his blood pressure to 80s/40s yesterday when up with PT so they could not get him out of bed.  Objective:    Vitals:   10/19/19 1452 10/19/19 1640 10/19/19 2117 10/20/19 0513  BP: (!) 98/48 (!) 94/55 (!) 95/46 (!) 110/57  Pulse: 74 70 70 70  Resp: 16 20 20 18   Temp: 97.9 F (36.6 C) 97.9 F (36.6 C)  98 F (36.7 C)  TempSrc: Oral Oral    SpO2: 94% 96% 93% 95%  Weight:      Height:        Intake/Output Summary (Last 24 hours) at 10/20/2019 0714 Last data filed at 10/20/2019 0640 Gross per 24 hour  Intake 720 ml  Output 2200 ml  Net -1480 ml   Filed Weights   10/17/19 0304 10/17/19 1800  Weight: 62.1 kg 57.2 kg    Exam: General: No acute distress. Cardiovascular: Heart sounds show a regular rate, and rhythm. No gallops or rubs. No murmurs. No JVD. Lungs: Clear to auscultation bilaterally with good air movement. No rales, rhonchi or wheezes. Abdomen: Soft, nontender, nondistended with normal active bowel sounds. No masses. No hepatosplenomegaly. Skin: Warm and dry.  Stage II pressure ulcer to the sacrum as described below. Extremities: No clubbing or cyanosis. No edema. Pedal pulses 2+.   Pressure Injury 10/17/19 Sacrum Medial Stage 2 -  Partial thickness loss of dermis presenting as a  shallow open injury with a red, pink wound bed without slough. (Active)  10/17/19 1742  Location: Sacrum  Location Orientation: Medial  Staging: Stage 2 -  Partial thickness loss of dermis presenting as a shallow open injury with a red, pink wound bed without slough.  Wound Description (Comments):   Present on Admission: Yes    Data Reviewed:   I have personally reviewed following labs and imaging studies:  Labs: Labs show the following:   Basic Metabolic Panel: Recent Labs  Lab 10/15/19 0430 10/17/19 0421 10/17/19 0822 10/18/19 0147 10/19/19 0126 10/20/19 0526  NA 136 136  --  139 139 138  K 4.4 3.8  --  3.6 2.9* 3.5  CL 102 104  --  100 97* 96*  CO2 25 22  --  25 30 31   GLUCOSE 103* 145*  --  209* 233* 289*  BUN 11 9  --  10 17 35*  CREATININE 0.59* 0.51*  --  0.49* 0.47* 0.53*  CALCIUM 8.2* 8.3*  --  8.7* 8.8* 8.7*  MG  --   --  1.6* 1.8 1.8  --   PHOS  --   --  3.4  --   --   --    GFR Estimated Creatinine Clearance: 52.6 mL/min (A) (by C-G formula based on SCr of 0.53 mg/dL (L)).  CBC: Recent Labs  Lab 10/14/19  [id number] 10/15/19 0430 10/16/19 0344 10/17/19 0421 10/18/19 0147  WBC 8.1 6.4 5.9 7.1 6.7  NEUTROABS  --   --   --  6.3  --   HGB 15.9 15.4 15.3 15.9 15.9  HCT 47.3 45.8 45.9 47.7 47.6  MCV 94.2 93.9 94.3 95.4 94.3  PLT 108* 127* 151 150 176   CBG: Recent Labs  Lab 10/18/19 2105 10/19/19 0749 10/19/19 1150 10/19/19 1634 10/20/19 0518  GLUCAP 198* 215* 251* 220* 281*    Microbiology Recent Results (from the past 240 hour(s))  Blood Culture (routine x 2)     Status: None   Collection Time: 10/11/19  6:42 AM   Specimen: BLOOD  Result Value Ref Range Status   Specimen Description   Final    BLOOD LEFT ARM Performed at [facility], [place] [address], [place], [place] [address]    Special Requests   Final    BOTTLES DRAWN AEROBIC AND ANAEROBIC Blood Culture results may not be optimal due to an excessive volume of blood  received in culture bottles Performed at [facility] [address], [place], [place] [address]    Culture   Final    NO GROWTH 5 DAYS Performed at [facility], [place] [address], [place], [place] [address]    Report Status 10/16/2019 FINAL  Final  Blood Culture (routine x 2)     Status: None   Collection Time: 10/11/19  6:42 AM   Specimen: BLOOD  Result Value Ref Range Status   Specimen Description   Final    BLOOD RIGHT ARM Performed at [facility] [address] [place], [place] [address]    Special Requests   Final    BOTTLES DRAWN AEROBIC AND ANAEROBIC Blood Culture adequate volume Performed at [facility] [address], [place], [place] [address]    Culture   Final    NO GROWTH 5 DAYS Performed at [facility], [place] [address] [place], [place] [address]    Report Status 10/16/2019 FINAL  Final  Urine culture     Status: Abnormal   Collection Time: 10/11/19  6:42 AM   Specimen: In/Out Cath Urine  Result Value Ref Range Status   Specimen Description   Final    IN/OUT CATH URINE Performed at [facility] [address] [place], [place] [address]    Special Requests   Final    NONE Performed at [facility], [place] [address] [place],  [address]    Culture (A)  Final    >=100,000 COLONIES/mL PSEUDOMONAS AERUGINOSA >=100,000 COLONIES/mL ENTEROCOCCUS FAECALIS    Report Status 10/14/2019 FINAL  Final   Organism ID, Bacteria PSEUDOMONAS AERUGINOSA (A)  Final   Organism ID, Bacteria ENTEROCOCCUS FAECALIS (A)  Final      Susceptibility   Enterococcus faecalis - MIC*    AMPICILLIN <=2 SENSITIVE Sensitive     NITROFURANTOIN <=16 SENSITIVE Sensitive     VANCOMYCIN 1 SENSITIVE Sensitive     * >=100,000 COLONIES/mL ENTEROCOCCUS FAECALIS   Pseudomonas aeruginosa - MIC*    CEFTAZIDIME 4 SENSITIVE Sensitive     CIPROFLOXACIN <=0.25 SENSITIVE Sensitive      GENTAMICIN <=1 SENSITIVE Sensitive     IMIPENEM 1 SENSITIVE Sensitive     PIP/TAZO 8 SENSITIVE Sensitive     CEFEPIME 2 SENSITIVE Sensitive     * >=100,000 COLONIES/mL PSEUDOMONAS AERUGINOSA  Body fluid culture (includes gram stain)  Status: None (Preliminary result)   Collection Time: 10/17/19  2:46 PM   Specimen: Pleural Fluid  Result Value Ref Range Status   Specimen Description   Final    PLEURAL Performed at [facility] [address], [place], [place] [address]    Special Requests   Final    NONE Performed at [facility], [place] [address], [place], [place] [address]    Gram Stain NO WBC SEEN NO ORGANISMS SEEN   Final   Culture   Final    NO GROWTH 2 DAYS Performed at [facility], [place] [address] [place], [place] [address]    Report Status PENDING  Incomplete    Procedures and diagnostic studies:  ECHOCARDIOGRAM COMPLETE  Result Date: 10/18/2019   ECHOCARDIOGRAM REPORT   Patient Name:   KEIANDRE GRUMET Date of Exam: 10/18/2019 Medical Rec #:  [id number]      Height:       68.0 in Accession #:    [id number]     Weight:       126.1 lb Date of Birth:  [birth year]      BSA:          1.68 m Patient Age:    59 years       BP:           138/53 mmHg Patient Gender: M              HR:           70 bpm. Exam Location:  Inpatient Procedure: 2D Echo, Cardiac Doppler and Color Doppler Indications:    Dyspnea 786.09  History:        Patient has prior history of Echocardiogram examinations, most                 recent 01/10/2015. Pacemaker, Arrythmias:Atrial Fibrillation,                 Signs/Symptoms:Hypotension; Risk Factors:Diabetes. Sepsis.                 Complete heart block.  Sonographer:    Paulita Fujita RDCS Referring Phys: [id number] [facility]  Sonographer Comments: Suboptimal parasternal window. IMPRESSIONS  1. Left ventricular ejection fraction, by visual estimation, is 45 to 50%. The left ventricle has normal function. There is no  left ventricular hypertrophy.  2. The left ventricle demonstrates regional wall motion abnormalities.  3. Interventricular septal dyssynchrony and hypokinesis consistent with RV pacing.  4. Global right ventricle has normal systolic function.The right ventricular size is moderately enlarged. No increase in right ventricular wall thickness.  5. Left atrial size was normal.  6. Right atrial size was normal.  7. Mild mitral annular calcification.  8. The mitral valve is normal in structure. No evidence of mitral valve regurgitation. No evidence of mitral stenosis.  9. The tricuspid valve is normal in structure. 10. The aortic valve is normal in structure. Aortic valve regurgitation is mild. Mild aortic valve stenosis. 11. The pulmonic valve was normal in structure. Pulmonic valve regurgitation is not visualized. 12. Moderately elevated pulmonary artery systolic pressure. 13. A pacer wire is visualized. 14. The inferior vena cava is normal in size with <50% respiratory variability, suggesting right atrial pressure of 8 mmHg. FINDINGS  Left Ventricle: Left ventricular ejection fraction, by visual estimation, is 45 to 50%. The left ventricle has normal function. The left ventricle demonstrates regional wall motion abnormalities. There is no left ventricular hypertrophy. Normal left  atrial pressure. Interventricular septal dyssynchrony and hypokinesis consistent with RV pacing. Right Ventricle: The right ventricular size is moderately enlarged. No increase in right ventricular wall thickness. Global RV systolic function is has normal systolic function. The tricuspid regurgitant velocity is 3.01 m/s, and with an assumed right atrial pressure of 8 mmHg, the estimated right ventricular systolic pressure is moderately elevated at 44.2 mmHg. Left Atrium: Left atrial size was normal in size. Right Atrium: Right atrial size was normal in size Pericardium: There is no evidence of pericardial effusion. Mitral Valve: The mitral  valve is normal in structure. Mild mitral annular calcification. No evidence of mitral valve regurgitation. No evidence of mitral valve stenosis by observation. Tricuspid Valve: The tricuspid valve is normal in structure. Tricuspid valve regurgitation moderate-severe. Aortic Valve: The aortic valve is normal in structure.. There is moderate thickening and moderate calcification of the aortic valve. Aortic valve regurgitation is mild. Mild aortic stenosis is present. Moderate aortic valve annular calcification. There is moderate thickening of the aortic valve. There is moderate calcification of the aortic valve. Pulmonic Valve: The pulmonic valve was normal in structure. Pulmonic valve regurgitation is not visualized. Pulmonic regurgitation is not visualized. Aorta: The aortic root, ascending aorta and aortic arch are all structurally normal, with no evidence of dilitation or obstruction. Venous: The inferior vena cava is normal in size with less than 50% respiratory variability, suggesting right atrial pressure of 8 mmHg. IAS/Shunts: No atrial level shunt detected by color flow Doppler. There is no evidence of a patent foramen ovale. No ventricular septal defect is seen or detected. There is no evidence of an atrial septal defect. Additional Comments: A pacer wire is visualized.  LEFT VENTRICLE PLAX 2D LVIDd:         3.60 cm LVIDs:         2.70 cm LV PW:         1.00 cm LV IVS:        1.00 cm LVOT diam:     1.70 cm LV SV:         27 ml LV SV Index:   16.64 LVOT Area:     2.27 cm  LV Volumes (MOD) LV area d, A2C:    20.20 cm LV area d, A4C:    18.90 cm LV area s, A2C:    16.60 cm LV area s, A4C:    14.60 cm LV major d, A2C:   5.61 cm LV major d, A4C:   6.32 cm LV major s, A2C:   5.64 cm LV major s, A4C:   5.57 cm LV vol d, MOD A2C: 60.2 ml LV vol d, MOD A4C: 47.8 ml LV vol s, MOD A2C: 39.2 ml LV vol s, MOD A4C: 32.4 ml LV SV MOD A2C:     21.0 ml LV SV MOD A4C:     47.8 ml LV SV MOD BP:      21.0 ml RIGHT  VENTRICLE RV S prime:     13.60 cm/s TAPSE (M-mode): 2.3 cm LEFT ATRIUM           Index       RIGHT ATRIUM           Index LA diam:      3.60 cm 2.14 cm/m  RA Area:     14.10 cm LA Vol (A2C): 34.5 ml 20.54 ml/m RA Volume:   39.80 ml  23.69 ml/m LA Vol (A4C): 47.5 ml 28.27 ml/m  AORTIC VALVE LVOT Vmax:  71.80 cm/s LVOT Vmean:  47.100 cm/s LVOT VTI:    0.113 m  AORTA Ao Root diam: 3.40 cm TRICUSPID VALVE TR Peak grad:   36.2 mmHg TR Vmax:        301.00 cm/s  SHUNTS Systemic VTI:  0.11 m Systemic Diam: 1.70 cm  Skeet Latch MD Electronically signed by Skeet Latch MD Signature Date/Time: 10/18/2019/2:46:13 PM    Final     Medications:   . apixaban  5 mg Oral BID  . atorvastatin  20 mg Oral Daily  . chlorhexidine  15 mL Mouth Rinse BID  . Chlorhexidine Gluconate Cloth  6 each Topical Daily  . cholecalciferol  1,000 Units Oral Daily  . collagenase  1 application Topical Daily  . furosemide  40 mg Intravenous BID  . insulin aspart  0-15 Units Subcutaneous TID WC  . insulin aspart  4 Units Subcutaneous TID WC  . mouth rinse  15 mL Mouth Rinse q12n4p  . methylPREDNISolone (SOLU-MEDROL) injection  60 mg Intravenous Q12H  . metoprolol tartrate  50 mg Oral BID  . multivitamin with minerals  1 tablet Oral Daily  . omega-3 acid ethyl esters  1 g Oral BID   Continuous Infusions: . ceFEPime (MAXIPIME) IV 2 g (10/19/19 2227)     LOS: 3 days    This patient is critically ill and I spent 35 minutes with the patient with greater than 50% in face-to-face interaction and therefore this is a level 3 visit.  Margreta Journey Michaeljoseph Revolorio  [facility] Pager [phone].   [facility] How to contact the [facility] Attending or Consulting provider [place] or covering provider during after hours [place], for this patient?  1. Check the care team in [facility] and look for a) attending/consulting [facility] provider listed and b) the [facility] team listed 2. Log into [email or web address] and use [place]'s universal password  to access. If you do not have the password, please contact the hospital operator. 3. Locate the [facility] provider you are looking for under [facility] and page to a number that you can be directly reached. 4. If you still have difficulty reaching the provider, please page the [facility] (Director on Call) for the Hospitalists listed on amion for assistance.  10/20/2019, 7:14 AM

## 2019-10-20 NOTE — Progress Notes (Signed)
  Speech Language Pathology Treatment: Dysphagia  Patient Details Name: Robert Cuevas MRN: [id number] DOB: 1930/05/26 Today's Date: 10/20/2019 Time: [id number] SLP Time Calculation (min) (ACUTE ONLY): 18 min  Assessment / Plan / Recommendation Clinical Impression  RN reports patient tolerating medication whole in applesauce well and breakfast this morning. His appetite is reduced, but he reports he hasn't eaten much in a while. He is able to state his need to eat slowly, small bites and clear his throat periodically. Pt required minimal verbal cues with PO trials observed. Clear vocal quality throughout session with ability to produce strong cough on command.    HPI HPI: Pt is an 85 year old man presenting with recurrent hypoxemic respiratory failure after having COVID-19 in early December. Pt was evaluated by SLP on 12/22 with no overt s/s of aspiration noted clinically. PMH: afib, HTN, HLD, pacemaker-dependence       SLP Plan  Continue with current plan of care       Recommendations  Diet recommendations: Dysphagia 1 (puree);Nectar-thick liquid Liquids provided via: Cup Medication Administration: Whole meds with puree Supervision: Patient able to self feed;Intermittent supervision to cue for compensatory strategies Compensations: Minimize environmental distractions;Slow rate;Small sips/bites;Other (Comment);Clear throat intermittently Postural Changes and/or Swallow Maneuvers: Seated upright 90 degrees;Upright 30-60 min after meal                Oral Care Recommendations: Oral care BID Follow up Recommendations: Skilled Nursing facility SLP Visit Diagnosis: Dysphagia, oropharyngeal phase (R13.12) Plan: Continue with current plan of care       GO               Wynelle Bourgeois., MA, CCC-SLP 10/20/2019, 9:43 AM

## 2019-10-20 NOTE — Progress Notes (Signed)
NAME:  Robert Cuevas, MRN:  [id number], DOB:  07-15-1930, LOS: 3 ADMISSION DATE:  10/17/2019, CONSULTATION DATE:  12/28 REFERRING MD:  Tonye Royalty, MD, CHIEF COMPLAINT:  Hypoxemic respiratory failure  Brief History   85 year old man with history of afib on AC, HTN, HLD, pacemaker-dependence presenting with recurrent hypoxemic respiratory failure after having COVID in early December.  Tested positive on December 2.  History as below, confirmed with patient:  " Was admitted to [facility] on 09/25/2019 -10/08/2019, treated withIV Remdisivir and Decadron,discharged on 1 L nasal cannula toSNFand again on 10/11/2019-12/27/2020withsevere sepsis and hypoxia from aspiration pneumonia, also with UTI.[name] was discharged with 2 L nasal cannula and instructions to f/u speech therapy-his nurse at [facility] reports he was satting 84% on 2 L nasal cannula. Increased him to 5 L and his sats wereonlly88%. Instructed by nurse practitioner to call [facility] sent here.Patient denies feeling short of breath. He has no complaints at this time. No chest pain. States he was sent here because of low oxygen levels.'  Denies chest pain, productive cough, fevers, chills, orthopnea. Seen by speech last admission rec'd for puree with thin liquids.  Consults:  PCCM  Procedures:  12/28 thoracentesis right chest therapeutic 700cc  Micro Data:  12/28Pleural fluid culture no WBC, no organisms 12/22 Urine cx PSA and E.Faecalis - pansensitive Antimicrobials:  Cefepime 12/28 >>   Interim history/subjective:   Denies any significant complaints at present He is on oxygen supplementation Denies any chest pains or chest discomfort Denies shortness of breath  Objective   Blood pressure (!) 110/57, pulse 70, temperature 98 F (36.7 C), resp. rate 18, height 5\' 8"  (1.727 m), weight 57.2 kg, SpO2 95 %.        Intake/Output Summary (Last 24 hours) at 10/20/2019 0915 Last  data filed at 10/20/2019 [id number] Gross per 24 hour  Intake 580 ml  Output 2200 ml  Net -1620 ml   Filed Weights   10/17/19 0304 10/17/19 1800  Weight: 62.1 kg 57.2 kg    Examination: General: Chronically ill-appearing HENT: Dry oral mucosa Lungs: Decreased breath sounds bilaterally, few rales at the bases Cardiovascular: S1-S2 appreciated Abdomen: scaphoid, soft Extremities: No clubbing, no edema Neuro: awake and alert, follows commands MSK: sacral ulcer Lines: PIV  CT scan of the chest from 12/28 reviewed showing multifocal infiltrates on the right with moderate effusion on the right Assessment & Plan:  85 yo gentleman with recent hospitalization for COVID pneumonia now with recurrent respiratory failure:  Acute hypoxemic Respiratory Failure-still requiring supplemental oxygen COVID 19 Pneumonia - now 21 days s/p last positive test no longer requires contact/droplet isolation Severe Protein Calorie Malnutrition Body mass index is 19.17 kg/m. Pressure Ulcer - POA Atrial Fibrillation, rate controlled on eliquis Steroid Induced Hyperglycemia Acute on chronic Heart failure reduced ejection fraction - decompensated UTI  Plan: Continue oxygen supplementation 5-day course of antibiotics recommended, defer to primary-today's day 4 of cefepime Tapering course of steroids Cautious diuresis-has been able to maintain negative balance the last couple of days Glucose management Metoprolol for rate control  Patient's plan is to go home with family Stated, spouse has had some therapies delivered to assist with caring for him at home   Best practice:  Diet: Dysphagia 2 diet with nectar thick liquids per MBS on 12/28 Pain/Anxiety/Delirium protocol (if indicated): minimize sedating medications VAP protocol (if indicated): n/a DVT prophylaxis: therapeutic AC GI prophylaxis: n/a Glucose control: steroid induced hyperglycemia, increase SSI as needed Foley  none - condom cath Mobility:  OOB with PT/OT Code Status: DNR Family Communication: patient updated at bedside Disposition: Patient is stable for RNF.   Labs/Imaging  CT Chest 12/28 with dense central peribronchovascular consolidations which spare the periphery, air bronchograms. Large right sided pleural effusion. Left lung relatively spared.   CBC: Recent Labs  Lab 10/14/19 0615 10/15/19 0430 10/16/19 0344 10/17/19 0421 10/18/19 0147  WBC 8.1 6.4 5.9 7.1 6.7  NEUTROABS  --   --   --  6.3  --   HGB 15.9 15.4 15.3 15.9 15.9  HCT 47.3 45.8 45.9 47.7 47.6  MCV 94.2 93.9 94.3 95.4 94.3  PLT 108* 127* 151 150 0000000    Basic Metabolic Panel: Recent Labs  Lab 10/15/19 0430 10/17/19 0421 10/17/19 0822 10/18/19 0147 10/19/19 0126 10/20/19 0526  NA 136 136  --  139 139 138  K 4.4 3.8  --  3.6 2.9* 3.5  CL 102 104  --  100 97* 96*  CO2 25 22  --  25 30 31   GLUCOSE 103* 145*  --  209* 233* 289*  BUN 11 9  --  10 17 35*  CREATININE 0.59* 0.51*  --  0.49* 0.47* 0.53*  CALCIUM 8.2* 8.3*  --  8.7* 8.8* 8.7*  MG  --   --  1.6* 1.8 1.8  --   PHOS  --   --  3.4  --   --   --    GFR: Estimated Creatinine Clearance: 52.6 mL/min (A) (by C-G formula based on SCr of 0.53 mg/dL (L)). Recent Labs  Lab 10/15/19 0430 10/16/19 0344 10/17/19 0421 10/18/19 0147  PROCALCITON <0.10  --   --  <0.10  WBC 6.4 5.9 7.1 6.7    Liver Function Tests: No results for input(s): AST, ALT, ALKPHOS, BILITOT, PROT, ALBUMIN in the last 168 hours. No results for input(s): LIPASE, AMYLASE in the last 168 hours. No results for input(s): AMMONIA in the last 168 hours.  ABG    Component Value Date/Time   PHART 7.435 10/17/2019 0826   PCO2ART 34.7 10/17/2019 0826   PO2ART 75.6 (L) 10/17/2019 0826   HCO3 22.9 10/17/2019 0826   ACIDBASEDEF 0.2 10/17/2019 0826   O2SAT 94.7 10/17/2019 0826     Coagulation Profile: No results for input(s): INR, PROTIME in the last 168 hours.  Cardiac Enzymes: No results for input(s): CKTOTAL,  CKMB, CKMBINDEX, TROPONINI in the last 168 hours.  HbA1C: Hgb A1c MFr Bld  Date/Time Value Ref Range Status  09/25/2019 04:52 AM 8.4 (H) 4.8 - 5.6 % Final    Comment:    (NOTE) Pre diabetes:          5.7%-6.4% Diabetes:              >6.4% Glycemic control for   <7.0% adults with diabetes     CBG: Recent Labs  Lab 10/19/19 0749 10/19/19 1150 10/19/19 1634 10/20/19 0518 10/20/19 0745  GLUCAP 215* 251* 220* 281* 268*   Sherrilyn Rist, MD [place], PCCM Cell: [phone]

## 2019-10-20 NOTE — TOC Progression Note (Signed)
Transition of Care [facility]) - Progression Note    Patient Details  Name: Robert Cuevas MRN: [id number] Date of Birth: [birth year]  Transition of Care [facility]) CM/SW [place], LCSW Phone Number: 10/20/2019, 4:43 PM  Clinical Narrative:   CSW spoke with patients spouse via phone. Spouse stated she would like patient to discharge home. Family agreeable to have Home Health follow patient and stated they just want an agency that will be able take patients insurance. CSW verified address with spouse [phone] [place] [address]). CSW spoke with [facility] with  [facility] and they stated they will look to see if they can cover patient for PT OT Rn     Expected Discharge Plan: [place]    Expected Discharge Plan and Services Expected Discharge Plan: [place]                                               Social Determinants of Health (SDOH) Interventions    Readmission Risk Interventions Readmission Risk Prevention Plan 10/02/2019  Medication Review (RN CM) Complete  Some recent data might be hidden

## 2019-10-21 DIAGNOSIS — J9601 Acute respiratory failure with hypoxia: Secondary | ICD-10-CM

## 2019-10-21 LAB — BASIC METABOLIC PANEL
Anion gap: 8 (ref 5–15)
BUN: 42 mg/dL — ABNORMAL HIGH (ref 8–23)
CO2: 35 mmol/L — ABNORMAL HIGH (ref 22–32)
Calcium: 9 mg/dL (ref 8.9–10.3)
Chloride: 101 mmol/L (ref 98–111)
Creatinine, Ser: 0.65 mg/dL (ref 0.61–1.24)
GFR calc Af Amer: >60 mL/min (ref >60)
GFR calc non Af Amer: >60 mL/min (ref >60)
Glucose, Bld: 198 mg/dL — ABNORMAL HIGH (ref 70–99)
Potassium: 4.4 mmol/L (ref 3.5–5.1)
Sodium: 144 mmol/L (ref 135–145)

## 2019-10-21 LAB — GLUCOSE, CAPILLARY
Glucose-Capillary: 270 mg/dL — ABNORMAL HIGH (ref 70–99)
Glucose-Capillary: 400 mg/dL — ABNORMAL HIGH (ref 70–99)
Glucose-Capillary: 317 mg/dL — ABNORMAL HIGH (ref 70–99)
Glucose-Capillary: 231 mg/dL — ABNORMAL HIGH (ref 70–99)
Glucose-Capillary: 110 mg/dL — ABNORMAL HIGH (ref 70–99)
Glucose-Capillary: 50 mg/dL — ABNORMAL LOW (ref 70–99)
Glucose-Capillary: 81 mg/dL (ref 70–99)
Glucose-Capillary: 179 mg/dL — ABNORMAL HIGH (ref 70–99)

## 2019-10-21 LAB — BODY FLUID CULTURE
Culture: NO GROWTH
Gram Stain: NONE SEEN

## 2019-10-21 MED ORDER — INSULIN GLARGINE 100 UNIT/ML ~~LOC~~ SOLN
20.0000 [IU] | Freq: Every day | SUBCUTANEOUS | Status: DC
  Administered 2019-10-21: 11:00:00 20 [IU] via SUBCUTANEOUS
  Filled 2019-10-21: qty 0.2

## 2019-10-21 MED ORDER — INSULIN ASPART 100 UNIT/ML ~~LOC~~ SOLN
15.0000 [IU] | Freq: Once | SUBCUTANEOUS | Status: AC
  Administered 2019-10-21: 14:00:00 15 [IU] via SUBCUTANEOUS

## 2019-10-21 MED ORDER — INSULIN GLARGINE 100 UNIT/ML ~~LOC~~ SOLN
25.0000 [IU] | Freq: Every day | SUBCUTANEOUS | Status: DC
  Administered 2019-10-22 – 2019-10-23 (×2): 25 [IU] via SUBCUTANEOUS
  Filled 2019-10-21 (×2): qty 0.25

## 2019-10-21 MED ORDER — INSULIN ASPART 100 UNIT/ML ~~LOC~~ SOLN
10.0000 [IU] | Freq: Three times a day (TID) | SUBCUTANEOUS | Status: DC
  Administered 2019-10-21 – 2019-10-22 (×2): 10 [IU] via SUBCUTANEOUS

## 2019-10-21 MED ORDER — INSULIN ASPART 100 UNIT/ML ~~LOC~~ SOLN
7.0000 [IU] | Freq: Once | SUBCUTANEOUS | Status: AC
  Administered 2019-10-21: 15:00:00 7 [IU] via SUBCUTANEOUS

## 2019-10-21 MED ORDER — METHYLPREDNISOLONE SODIUM SUCC 40 MG IJ SOLR: 40.0000 mg | Freq: Every day | INTRAMUSCULAR | Status: DC

## 2019-10-21 MED ORDER — INSULIN ASPART 100 UNIT/ML ~~LOC~~ SOLN
8.0000 [IU] | Freq: Three times a day (TID) | SUBCUTANEOUS | Status: DC
  Administered 2019-10-21: 08:00:00 8 [IU] via SUBCUTANEOUS

## 2019-10-21 NOTE — Progress Notes (Signed)
CBG 206 (10/21/2019 @ 2200)- did not transfer over to results. Norlene Duel RN, BSN

## 2019-10-21 NOTE — Progress Notes (Signed)
Progress Note    Robert Cuevas  [id number] DOB: 1930-01-02  DOA: 10/17/2019 PCP: [facility]    Brief Narrative:   Chief complaint: Follow-up hypoxia/shortness of breath  Medical records reviewed and are as summarized below:  Robert Cuevas is an 86 y.o. male with a PMH of hypertension, hyperlipidemia, PAF on chronic Eliquis, complete heart block status post pacemaker, type 2 diabetes who was admitted from his SNF with hypoxia in the setting of recently being diagnosed with COVID-19.  He was previously hospitalized 09/25/2019-10/08/2019 at which time he received a treatment course of IV remdesivir and Decadron, was subsequently readmitted 10/11/2019-10/16/2019 with severe sepsis (POA at that time) and hypoxia secondary to aspiration pneumonia/UTI.  Readmitted a third time 10/17/19 with ongoing hypoxia requiring increasing oxygen.  CT angiogram showed nonobstructive segmental pulmonary emboli in the right lower lobe and severe multilobar pneumonia with moderate right and small left pleural effusions.  Assessment/Plan:   Principal Problem:   Acute on chronic respiratory failure with hypoxia (HCC) secondary to multilobar pneumonia in the setting of recent COVID-19 associated pneumonia and aspiration complicated by pulmonary emboli The patient presented with pneumonia but not sepsis on admission.  At this time, primary driver of the patient's pneumonia felt to be secondary bacterial pneumonia in the setting of aspiration and he is therefore being covered for healthcare associated pathogens including Pseudomonas (had prior pseudomonal UTI).  Continue cefepime x 7 days.  He also continues on steroids and supplemental oxygen.  Status post speech therapy evaluation with dysphagia 1 diet recommended.  With regard to the pulmonary emboli, the patient remains on Eliquis.  Given the patient's recurrent readmissions and persistent hypoxia, a palliative care consultation was performed  10/19/2019 at this time, the patient and family continue to want the full scope of treatment with the hope of discharging home.  Blood cultures negative.  Wean oxygen as tolerated, continues on supplemental oxygen intermittently and drops his oxygen saturation with any activity.  Will likely need home oxygen.  Decrease Solu-Medrol to 40 mg daily.  Active Problems:   Orthostatic hypotension/Elevated BUN Blood pressure improved with reinitiation of IV fluids.  BUN remains elevated, so we will continue IV fluids today.    Enterococcus/Pseudomonas UTI Remains on cefepime which should cover.    Hypokalemia Resolved, potassium 4.4.    Bilateral pleural effusions Underwent right-sided thoracentesis 10/17/2019 with 700 cc of fluid removed.  Left-sided pleural effusion is small.  Pleural fluid cultures were negative.     Dysphagia Evaluated by speech therapy, continue dysphagia 1 diet.    Paroxysmal atrial fibrillation (HCC)/Long term (current) use of anticoagulants/status post pacemaker Continue metoprolol.  2D echo shows EF 45-50% with regional wall motion abnormalities.    COVID-19 virus infection No longer felt to be shedding virus and therefore is off isolation.    Diabetes mellitus type II, non insulin dependent ([place]), uncontrolled Hemoglobin A1c 8.4% on 09/25/2019.  Metformin on hold.  Chronic currently being managed with Lantus 10 units daily, moderate scale SSI and 4 units of meal coverage.  270-394.  Increase Lantus to 20 units and change meal coverage to 8 units.  Weaning steroids as tolerated.    Pressure ulcer, stage II to sacrum, present prior to admission Continue wound care per nursing.    Hyperlipidemia Continue Lipitor.    Severe protein calorie malnutrition/underweight  Body mass index is 19.17 kg/m.  Dietitian consultation requested and performed.  Noted to have severe fat depletion and severe muscle depletion consistent  with severe malnutrition.  Continue supplements as  ordered.    Family Communication/Anticipated D/C date and plan/Code Status   DVT prophylaxis: On chronic Eliquis. Code Status: DNR.  Family Communication: No answer -- called spouse's phone. Disposition Plan: Persistent hyperglycemia the current barrier to discharge but hopeful we can stabilize his glycemic control over the next 24-48 hours.   Medical Consultants:    Critical care  Palliative care   Anti-Infectives:    Cefepime 10/17/2019--->  Subjective:   No new complaints.  The patient denies shortness of breath but has 5 L of nasal cannula in place upon rounding.  No reports of cough.  Appetite fair at best.  Objective:    Vitals:   10/20/19 1434 10/20/19 2121 10/20/19 2213 10/21/19 0455  BP: (!) 100/59 (!) 100/50 (!) 106/51 119/63  Pulse: 69 70 70 70  Resp: 17 18  20   Temp:  97.7 F (36.5 C)  98 F (36.7 C)  TempSrc:  Oral  Oral  SpO2: 96% 95%  94%  Weight:      Height:        Intake/Output Summary (Last 24 hours) at 10/21/2019 0750 Last data filed at 10/21/2019 0600 Gross per 24 hour  Intake 1856.82 ml  Output 1450 ml  Net 406.82 ml   Filed Weights   10/17/19 0304 10/17/19 1800  Weight: 62.1 kg 57.2 kg    Exam: General: No acute distress. Cardiovascular: Heart sounds show a regular rate, and rhythm. No gallops or rubs. No murmurs. No JVD. Lungs: Clear to auscultation bilaterally with good air movement. No rales, rhonchi or wheezes. Abdomen: Soft, nontender, nondistended with normal active bowel sounds. No masses. No hepatosplenomegaly. Skin: Warm and dry.  Sacral wound as described below. Extremities: No clubbing or cyanosis. No edema. Pedal pulses 2+.  Pressure Injury 10/17/19 Sacrum Medial Stage 2 -  Partial thickness loss of dermis presenting as a shallow open injury with a red, pink wound bed without slough. (Active)  10/17/19 1742  Location: Sacrum  Location Orientation: Medial  Staging: Stage 2 -  Partial thickness loss of dermis  presenting as a shallow open injury with a red, pink wound bed without slough.  Wound Description (Comments):   Present on Admission: Yes    Data Reviewed:   I have personally reviewed following labs and imaging studies:  Labs: Labs show the following:   Basic Metabolic Panel: Recent Labs  Lab 10/17/19 0421 10/17/19 0822 10/18/19 0147 10/19/19 0126 10/20/19 0526 10/21/19 0432  NA 136  --  139 139 138 144  K 3.8  --  3.6 2.9* 3.5 4.4  CL 104  --  100 97* 96* 101  CO2 22  --  25 30 31  35*  GLUCOSE 145*  --  209* 233* 289* 198*  BUN 9  --  10 17 35* 42*  CREATININE 0.51*  --  0.49* 0.47* 0.53* 0.65  CALCIUM 8.3*  --  8.7* 8.8* 8.7* 9.0  MG  --  1.6* 1.8 1.8  --   --   PHOS  --  3.4  --   --   --   --    GFR Estimated Creatinine Clearance: 52.6 mL/min (by C-G formula based on SCr of 0.65 mg/dL).  CBC: Recent Labs  Lab 10/15/19 0430 10/16/19 0344 10/17/19 0421 10/18/19 0147  WBC 6.4 5.9 7.1 6.7  NEUTROABS  --   --  6.3  --   HGB 15.4 15.3 15.9 15.9  HCT 45.8 45.9 47.7  47.6  MCV 93.9 94.3 95.4 94.3  PLT 127* 151 150 176   CBG: Recent Labs  Lab 10/20/19 0745 10/20/19 1154 10/20/19 1650 10/20/19 2059 10/21/19 0742  GLUCAP 268* 394* 278* 327* 270*    Microbiology Recent Results (from the past 240 hour(s))  Body fluid culture (includes gram stain)     Status: None (Preliminary result)   Collection Time: 10/17/19  2:46 PM   Specimen: Pleural Fluid  Result Value Ref Range Status   Specimen Description   Final    PLEURAL Performed at [facility] [address] [place], [place] [address]    Special Requests   Final    NONE Performed at [facility], [place] [address] [place], [place] [address]    Gram Stain NO WBC SEEN NO ORGANISMS SEEN   Final   Culture   Final    NO GROWTH 3 DAYS Performed at [facility], [place] [address] [place], [place] [address]    Report Status PENDING  Incomplete    Procedures  and diagnostic studies:  No results found.  Medications:   . apixaban  5 mg Oral BID  . atorvastatin  20 mg Oral Daily  . chlorhexidine  15 mL Mouth Rinse BID  . Chlorhexidine Gluconate Cloth  6 each Topical Daily  . cholecalciferol  1,000 Units Oral Daily  . collagenase  1 application Topical Daily  . furosemide  40 mg Intravenous BID  . insulin aspart  0-20 Units Subcutaneous TID WC  . insulin aspart  0-5 Units Subcutaneous QHS  . insulin aspart  6 Units Subcutaneous TID WC  . insulin glargine  10 Units Subcutaneous Daily  . mouth rinse  15 mL Mouth Rinse q12n4p  . methylPREDNISolone (SOLU-MEDROL) injection  40 mg Intravenous Q12H  . metoprolol tartrate  50 mg Oral BID  . multivitamin with minerals  1 tablet Oral Daily  . omega-3 acid ethyl esters  1 g Oral BID  . potassium chloride  10 mEq Oral BID   Continuous Infusions: . sodium chloride 100 mL/hr at 10/21/19 0600  . ceFEPime (MAXIPIME) IV Stopped (10/20/19 2151)     LOS: 4 days     Jacquelynn Cree  [facility] Pager [phone].   [facility] How to contact the [facility] Attending or Consulting provider [place] or covering provider during after hours [place], for this patient?  1. Check the care team in [facility] and look for a) attending/consulting TRH provider listed and b) the [facility] team listed 2. Log into [email or web address] and use [place]'s universal password to access. If you do not have the password, please contact the hospital operator. 3. Locate the [facility] provider you are looking for under [facility] and page to a number that you can be directly reached. 4. If you still have difficulty reaching the provider, please page the [facility] (Director on Call) for the Hospitalists listed on amion for assistance.  10/21/2019, 7:50 AM

## 2019-10-21 NOTE — Progress Notes (Addendum)
Physical Therapy Treatment Patient Details Name: Robert Cuevas MRN: [id number] DOB: 1930/04/20 Today's Date: 10/21/2019    History of Present Illness 86 yo male admitted with acute on chronic respiratory failure, PE, severe Pna. Initially diagnosed with COVID 09/21/19. Admitted to [facility] then d/c'd to SNF. Multiple readmissions to hospital since. Hx of pacemaker, DM, A fib, HB    PT Comments    Pt reporting back pain on arrival and agreeable to mobilize.  Obtained BP in supine and sitting however pt unable to tolerate standing.  Pt briefly sat EOB (only long enough for BP reading) then requested back to bed due to pain likely from sacral pressure injury.  Per notes, family prefers taking pt home although pt would benefit from SNF.  Pt would benefit from increased home care if home.    (RN also reporting MD wanting pt up to recliner however pt will likely need pressure redistribution cushion in order to tolerate and RN aware)    Follow Up Recommendations  Home health PT;Supervision/Assistance - 24 hour(would benefit from SNF however family prefer home per notes)     Equipment Recommendations  Rolling walker with 5" wheels    Recommendations for Other Services       Precautions / Restrictions Precautions Precautions: Fall;ICD/Pacemaker Precaution Comments: monitor BP    Mobility  Bed Mobility Overal bed mobility: Needs Assistance Bed Mobility: Supine to Sit;Sit to Supine     Supine to sit: Mod assist;+2 for physical assistance;+2 for safety/equipment Sit to supine: Mod assist;+2 for physical assistance;+2 for safety/equipment   General bed mobility comments: Increased time. Assist for trunk. Cues for safety, technique.  Pt very uncomfortable EOB, able to wait for BP reading however then wished to return to supine due to pain, also did reports dizziness in sitting (orthostatics obtained supine and sitting however pt unable to stand)  Transfers                     Ambulation/Gait                 Stairs             Wheelchair Mobility    Modified Rankin (Stroke Patients Only)       Balance                                            Cognition Arousal/Alertness: Awake/alert Behavior During Therapy: WFL for tasks assessed/performed Overall Cognitive Status: Within Functional Limits for tasks assessed                                        Exercises      General Comments        Pertinent Vitals/Pain Pain Assessment: Faces Faces Pain Scale: Hurts whole lot Pain Location: "buttocks" (has pressure injury) Pain Descriptors / Indicators: Discomfort Pain Intervention(s): Monitored during session;Repositioned(RN notified)    Home Living                      Prior Function            PT Goals (current goals can now be found in the care plan section) Progress towards PT goals: Not progressing toward goals - comment(pain)    Frequency  Min 3X/week      PT Plan Current plan remains appropriate    Co-evaluation              AM-PAC PT "6 Clicks" Mobility   Outcome Measure  Help needed turning from your back to your side while in a flat bed without using bedrails?: A Little Help needed moving from lying on your back to sitting on the side of a flat bed without using bedrails?: A Lot Help needed moving to and from a bed to a chair (including a wheelchair)?: A Lot Help needed standing up from a chair using your arms (e.g., wheelchair or bedside chair)?: A Lot Help needed to walk in hospital room?: A Lot Help needed climbing 3-5 steps with a railing? : Total 6 Click Score: 12    End of Session   Activity Tolerance: Patient limited by pain Patient left: in bed;with call bell/phone within reach;with nursing/sitter in room Nurse Communication: Mobility status PT Visit Diagnosis: Muscle weakness (generalized) (M62.81);Difficulty in walking, not elsewhere  classified (R26.2)     Time: [id number] PT Time Calculation (min) (ACUTE ONLY): 12 min  Charges:  $Therapeutic Activity: 8-22 mins                     Arlyce Dice, DPT Acute Rehabilitation Services Office: [phone]  Nayelly Laughman,KATHrine E 10/21/2019, 1:05 PM

## 2019-10-21 NOTE — Progress Notes (Signed)
Pt noon CBG 400. MD Rama made aware, and orders given to give sliding scale, plus additional scheduled meal coverage, and recheck CBG in 1 hour. At 1 hour recheck, CBG was 317. MD Rama made aware and news orders placed. Will carry out new orders and recheck CBG at 1 hour.  1440: CBG 231. MD Rama made aware, new orders placed and asked to recheck CBG in 1 hour.  1605: CBG 110.  1730: CBG 50. Pt asymptomatic. Given sherbert and MD Rama made aware.   1829: CBG 81. Pt alert, ate cup of applesauce, and currently eating another cup of sherbert.  Will continue to monitor patient.

## 2019-10-22 DIAGNOSIS — I2699 Other pulmonary embolism without acute cor pulmonale: Secondary | ICD-10-CM | POA: Diagnosis present

## 2019-10-22 LAB — BASIC METABOLIC PANEL
Anion gap: 10 (ref 5–15)
BUN: 37 mg/dL — ABNORMAL HIGH (ref 8–23)
CO2: 31 mmol/L (ref 22–32)
Calcium: 8.4 mg/dL — ABNORMAL LOW (ref 8.9–10.3)
Chloride: 101 mmol/L (ref 98–111)
Creatinine, Ser: 0.42 mg/dL — ABNORMAL LOW (ref 0.61–1.24)
GFR calc Af Amer: >60 mL/min (ref >60)
GFR calc non Af Amer: >60 mL/min (ref >60)
Glucose, Bld: 146 mg/dL — ABNORMAL HIGH (ref 70–99)
Potassium: 3.2 mmol/L — ABNORMAL LOW (ref 3.5–5.1)
Sodium: 142 mmol/L (ref 135–145)

## 2019-10-22 LAB — GLUCOSE, CAPILLARY
Glucose-Capillary: 109 mg/dL — ABNORMAL HIGH (ref 70–99)
Glucose-Capillary: 116 mg/dL — ABNORMAL HIGH (ref 70–99)
Glucose-Capillary: 311 mg/dL — ABNORMAL HIGH (ref 70–99)

## 2019-10-22 MED ORDER — RESOURCE THICKENUP CLEAR PO POWD: ORAL | 6 refills | Status: DC

## 2019-10-22 MED ORDER — PREDNISONE 20 MG PO TABS
40.0000 mg | ORAL_TABLET | Freq: Every day | ORAL | Status: AC
  Administered 2019-10-22 – 2019-10-24 (×3): 40 mg via ORAL
  Filled 2019-10-22 (×3): qty 2

## 2019-10-22 MED ORDER — OXYCODONE-ACETAMINOPHEN 5-325 MG PO TABS
1.0000 | ORAL_TABLET | Freq: Three times a day (TID) | ORAL | Status: DC | PRN
  Administered 2019-10-22 (×2): 1 via ORAL
  Filled 2019-10-22 (×2): qty 1

## 2019-10-22 MED ORDER — POTASSIUM CHLORIDE CRYS ER 20 MEQ PO TBCR
20.0000 meq | EXTENDED_RELEASE_TABLET | Freq: Two times a day (BID) | ORAL | Status: DC
  Administered 2019-10-22 – 2019-10-25 (×7): 20 meq via ORAL
  Filled 2019-10-22 (×7): qty 1

## 2019-10-22 NOTE — Discharge Summary (Deleted)
Physician Discharge Summary  Robert Cuevas [id number] DOB: April 05, 1930 DOA: 10/17/2019  [facility]  Admit date: 10/17/2019 Discharge date: 10/22/2019  Admitted From: SNF ([facility]) Discharge disposition: Home   Recommendations for Outpatient Follow-Up:   1. Needs close follow-up, high risk for readmission as he is discharging home despite SNF being recommended and is extremely frail.  Home health services set up with DME including oxygen and a rolling walker.   Discharge Diagnosis:   Principal Problem:   Acute on chronic respiratory failure with hypoxia (HCC) secondary to aspiration pneumonia in the setting of dysphagia status post COVID-19 virus infection Active Problems:   Aspiration pneumonia (HCC)   Bilateral pulmonary emboli   Pacemaker- St Jude    Dysphagia   Paroxysmal atrial fibrillation (HCC)   Long term (current) use of anticoagulants   COVID-19 virus infection   Diabetes mellitus type II, non insulin dependent (HCC)   Pneumonia due to COVID-19 virus   Pressure ulcer   Type 2 diabetes mellitus with hyperlipidemia (HCC)   Orthostatic hypotension   Acute lower UTI   Pleural effusion, bilateral   Protein-calorie malnutrition, severe (HCC)   Patient underweight   Hypokalemia   History of complete heart block   Elevated BUN  Discharge Condition: Improved but guarded.  Diet recommendation:  Carbohydrate-modified.   Code status: DO NOT RESUSCITATE.   History of Present Illness:   Robert Cuevas is an 86 y.o. male with a PMH of hypertension, hyperlipidemia, PAF on chronic Eliquis, complete heart block status post pacemaker, type 2 diabetes who was admitted from his SNF with hypoxia in the setting of recently being diagnosed with COVID-19.  He was previously hospitalized 09/25/2019-10/08/2019 at which time he received a treatment course of IV remdesivir and Decadron, was subsequently readmitted 10/11/2019-10/16/2019 with severe sepsis (POA  at that time) and hypoxia secondary to aspiration pneumonia/UTI.  Readmitted a third time 10/17/19 with ongoing hypoxia requiring increasing oxygen.  CT angiogram showed nonobstructive segmental pulmonary emboli in the right lower lobe and severe multilobar pneumonia with moderate right and small left pleural effusions.  Hospital Course by Problem:   Principal Problem:   Acute on chronic respiratory failure with hypoxia (HCC) secondary to multilobar pneumonia in the setting of recent COVID-19 associated pneumonia and aspiration complicated by pulmonary emboli The patient presented with pneumonia but not sepsis on admission.  At this time, primary driver of the patient's pneumonia felt to be secondary bacterial pneumonia in the setting of aspiration and he is therefore was covered for healthcare associated pathogens including Pseudomonas (had prior pseudomonal UTI) with cefepime. He also was treated with steroids and supplemental oxygen. Will discontinue steroids on discharge but continue supplemental oxygen with home DME set up.  Status post speech therapy evaluation with dysphagia 1 diet recommended. Given the patient's recurrent readmissions and persistent hypoxia, a palliative care consultation was performed 10/19/2019 at this time, the patient and family continue to want the full scope of treatment with the hope of discharging home.  Blood cultures negative.    Active Problems:   Bilateral pulmonary emboli Continue Eliquis.  Would not increase dose given these were very tiny emboli.    Orthostatic hypotension/Elevated BUN Lasix discontinued with improvement in blood pressure.    Enterococcus/Pseudomonas UTI Status post treatment with cefepime.    Hypokalemia Likely related to treatment with Lasix, will not discharge on further diuretics so no need for ongoing supplementation at discharge.    Bilateral pleural effusions Underwent right-sided thoracentesis 10/17/2019  with 700 cc of fluid  removed.  Left-sided pleural effusion is small.  Pleural fluid cultures were negative.     Dysphagia Evaluated by speech therapy, continue dysphagia 1 diet.  Fluids should be thickened to nectar consistency.    Paroxysmal atrial fibrillation (HCC)/Long term (current) use of anticoagulants/status post pacemaker Continue metoprolol.  2D echo shows EF 45-50% with regional wall motion abnormalities.    COVID-19 virus infection No longer felt to be shedding virus and therefore is off isolation.    Diabetes mellitus type II, non insulin dependent ([place]), uncontrolled Hemoglobin A1c 8.4% on 09/25/2019.  Can resume Metformin at discharge.  Hopefully with discontinuation of steroids, the patient's glycemic control will stabilize.    Pressure ulcer, stage II to sacrum, present prior to admission Wound care provided by nursing.  Home health nurse will follow.    Hyperlipidemia Continue Lipitor.    Severe protein calorie malnutrition/underweight  Body mass index is 19.17 kg/m.  Dietitian consultation requested and performed.  Noted to have severe fat depletion and severe muscle depletion consistent with severe malnutrition.  Continue supplements as ordered.  Medical Consultants:    None.   Discharge Exam:   Vitals:   10/21/19 2100 10/22/19 0500  BP: 120/66 118/70  Pulse: 70 68  Resp: 20 20  Temp: 97.9 F (36.6 C) 98 F (36.7 C)  SpO2: 96% 98%   Vitals:   10/21/19 1308 10/21/19 1544 10/21/19 2100 10/22/19 0500  BP: (!) 121/59 112/62 120/66 118/70  Pulse: 70 70 70 68  Resp: 18 (!) 22 20 20   Temp: (!) 97.4 F (36.3 C)  97.9 F (36.6 C) 98 F (36.7 C)  TempSrc: Oral  Oral Oral  SpO2: 97% 95% 96% 98%  Weight:      Height:        General exam: Appears calm and comfortable.  Respiratory system: Clear to auscultation, but diminished. Respiratory effort normal at rest but tachypneic with activity. Cardiovascular system: S1 & S2 heard, RRR. No JVD,  rubs, gallops or clicks.  No murmurs. Gastrointestinal system: Abdomen is nondistended, soft and nontender. No organomegaly or masses felt. Normal bowel sounds heard. Central nervous system: Alert and oriented x2. No focal neurological deficits. Extremities: No clubbing,  or cyanosis. No edema. Skin: No rashes, scattered ecchymosis Psychiatry: Judgement and insight appear impaired. Mood & affect flat.    The results of significant diagnostics from this hospitalization (including imaging, microbiology, ancillary and laboratory) are listed below for reference.     Procedures and Diagnostic Studies:   DG Chest 1 View  Result Date: 10/17/2019 CLINICAL DATA:  Decreased oxygen saturation EXAM: CHEST  1 VIEW COMPARISON:  Film from earlier in the same day. FINDINGS: Cardiac shadow is stable. Pacing device is again seen. Diffuse infiltrates are noted right greater than left stable from prior exam. No acute bony abnormality is seen. No pneumothorax is noted. IMPRESSION: Stable appearance of the chest when compared with the prior study. Electronically Signed   By: Inez Catalina M.D.   On: 10/17/2019 15:16   CT ANGIO CHEST PE W OR WO CONTRAST CT Chest High Resolution  Result Date: 10/17/2019 CLINICAL DATA:  86 year old male with history of respiratory failure. COVID-19 infection with increasing oxygen requirements. EXAM: CT CHEST WITHOUT CONTRAST CT ANGIOGRAPHY CHEST WITH CONTRAST TECHNIQUE: Multidetector CT imaging of the chest was performed following the standard protocol without intravenous contrast. High resolution imaging of the lungs, as well as inspiratory and expiratory imaging, was performed. Additional multidetector CT imaging  of the chest was performed using the standard protocol during bolus administration of intravenous contrast. Multiplanar CT image reconstructions and MIPs were obtained to evaluate the vascular anatomy. COMPARISON:  Chest CTA 03/03/2013. FINDINGS: Cardiovascular: Although study is limited by  considerable patient respiratory motion, there is a small nonocclusive central filling defect in a right lower lobe segmental sized branch (axial image 129 of series 7) of the pulmonary arteries, indicative of pulmonary embolism. No other larger more central or lobar filling defect is noted. Heart size is enlarged with biatrial dilatation (right greater than left). There is no significant pericardial fluid, thickening or pericardial calcification. There is aortic atherosclerosis, as well as atherosclerosis of the great vessels of the mediastinum and the coronary arteries, including calcified atherosclerotic plaque in the left main, left anterior descending, left circumflex and right coronary arteries. Thickening calcification of the aortic valve and calcifications of the mitral annulus. Right-sided pacemaker device in place with lead tips terminating in the right atrial appendage and right ventricle. Mediastinum/Nodes: No pathologically enlarged mediastinal or hilar lymph nodes. Moderate-sized hiatal hernia. No axillary lymphadenopathy. Lungs/Pleura: Patchy multifocal areas of ground-glass attenuation, septal thickening and frank airspace consolidation in the lungs bilaterally (right much greater than left). The densest areas of consolidation are in the right upper and right lower lobe where there is some subpleural sparing. Moderate right and small left pleural effusions lying dependently. Upper Abdomen: Aortic atherosclerosis. Musculoskeletal: There are no aggressive appearing lytic or blastic lesions noted in the visualized portions of the skeleton. IMPRESSION: 1. Tiny nonobstructive segmental sized embolus in the right lower lobe pulmonary arteries, as above. 2. Severe multilobar bilateral pneumonia (right greater than left), compatible with reported COVID-19 infection. 3. Cardiomegaly with biatrial dilatation. 4. Moderate right and small left pleural effusions. 5. Aortic atherosclerosis, in addition to left  main and 3 vessel coronary artery disease. 6. There are calcifications of the aortic valve and mitral annulus. Echocardiographic correlation for evaluation of potential valvular dysfunction may be warranted if clinically indicated. Aortic Atherosclerosis (ICD10-I70.0). Electronically Signed   By: Vinnie Langton M.D.   On: 10/17/2019 10:26    DG Chest Portable 1 View  Result Date: 10/17/2019 CLINICAL DATA:  Shortness of breath with increasing O2 requirement EXAM: PORTABLE CHEST 1 VIEW COMPARISON:  Five days ago FINDINGS: Enlarged heart. Bilateral airspace disease more dense on the right where there is also some volume loss. Cardiomegaly. Unremarkable dual-chamber pacer positioning. IMPRESSION: Stable pulmonary infiltrates asymmetric to the right. Electronically Signed   By: Monte Fantasia M.D.   On: 10/17/2019 05:28   DG Swallowing Func-Speech Pathology  Result Date: 10/17/2019 Objective Swallowing Evaluation: Type of Study: MBS-Modified Barium Swallow Study  Patient Details Name: Robert Cuevas MRN: [id number] Date of Birth: 24-Mar-1930 Today's Date: 10/17/2019 Time: SLP Start Time (ACUTE ONLY): [id number] -SLP Stop Time (ACUTE ONLY): [id number] SLP Time Calculation (min) (ACUTE ONLY): 28 min Past Medical History: Past Medical History: Diagnosis Date . Cancer ([place])   skin cancer removed from head . Complete heart block ([place])   a. s/p gen change 07/2011. b. Pacer pocket infx 02/2013 - s/p extraction, temp perm placement, then eventual implantation of new St. Jude pacemaker 03/04/13. . Coronary artery calcification   Seen on CT 02/2013. . High cholesterol  . HOH (hard of hearing)  . Hyperglycemia  . Hypertension  . Pacemaker- St Jude  03/06/2013  Extracted 5/14 Reimplant 5/14  . Paroxysmal atrial fibrillation ([place]) 01/04/2015 Past Surgical History: Past Surgical History: Procedure Laterality Date . GENERATOR REMOVAL  N/A 03/02/2013  Procedure: GENERATOR REMOVAL;  Surgeon: Evans Lance, MD;  Location: [facility];  Service:  Cardiovascular;  Laterality: N/A; . ICD LEAD REMOVAL N/A 03/02/2013  Procedure: ICD LEAD REMOVAL;  Surgeon: Evans Lance, MD;  Location: [facility];  Service: Cardiovascular;  Laterality: N/A; . INGUINAL HERNIA REPAIR Bilateral  . PACEMAKER GENERATOR CHANGE N/A 09/19/2011  Procedure: PACEMAKER GENERATOR CHANGE;  Surgeon: Sanda Klein, MD;  Location: [facility] CATH LAB;  Service: Cardiovascular;  Laterality: N/A; . PACEMAKER INSERTION   . PACEMAKER LEAD REMOVAL N/A 03/02/2013  Procedure: PACEMAKER LEAD REMOVAL;  Surgeon: Evans Lance, MD;  Location: [place];  Service: Cardiovascular;  Laterality: N/A; . PERMANENT PACEMAKER INSERTION N/A 03/04/2013  Procedure: PERMANENT PACEMAKER INSERTION;  Surgeon: Evans Lance, MD;  Location: [facility] C[facility];  Service: Cardiovascular;  Laterality: N/A; HPI: Pt is an 86 year old man presenting with recurrent hypoxemic respiratory failure after having COVID-19 in early December. Pt was evaluated by SLP on 12/22 with no overt s/s of aspiration noted clinically. PMH: afib, HTN, HLD, pacemaker-dependence  Subjective: denies any trouble swallowing Assessment / Plan / Recommendation CHL IP CLINICAL IMPRESSIONS 10/17/2019 Clinical Impression  Pt has a mild oropharyngeal dysphagia with silent aspiration of thin liquids that appears to be primarily related to timing issues and mild, generalized weakness. His oral phase is mildly prolonged, particularly his mastication even though he has his dentures in place for the study. There is mild oral/lingual residue after the swallow as well as mild-moderate residue across the base of tongue, valleculae, and pyriform sinuses. When pt takes a small, single sip of thin liquid he does well; however, he typically drinks with more impulsivity, and when he does, this ultimately results in silent aspiration. He can achieve adequate LVC, but when he already has pharyngeal residue present and his timing is minimally off, he does not get his laryngeal vestibule sealed in  time. Penetration occurs with nectar thick liquids when consumed in the same fashion, but aspiration is not observed. Pt can clear thin and nectar thick liquids from his laryngeal vestibule well with a cued cough or throat clear. Recommend Dys 2 diet to facilitate oropharyngeal clearance and small, single cup sips of nectar thick liquids while pt's respiratory status is more compromised. Will f/u with pt to try to advance diet if strategies can be consistently implemented as his overall strength and respirations improve.  SLP Visit Diagnosis Dysphagia, oropharyngeal phase (R13.12) Attention and concentration deficit following -- Frontal lobe and executive function deficit following -- Impact on safety and function Mild aspiration risk;Moderate aspiration risk   CHL IP TREATMENT RECOMMENDATION 10/17/2019 Treatment Recommendations Therapy as outlined in treatment plan below   Prognosis 10/17/2019 Prognosis for Safe Diet Advancement Fair Barriers to Reach Goals -- Barriers/Prognosis Comment -- CHL IP DIET RECOMMENDATION 10/17/2019 SLP Diet Recommendations Dysphagia 2 (Fine chop) solids;Nectar thick liquid Liquid Administration via Cup;No straw Medication Administration Crushed with puree Compensations Minimize environmental distractions;Slow rate;Small sips/bites;Other (Comment);Clear throat intermittently Postural Changes Remain semi-upright after after feeds/meals (Comment);Seated upright at 90 degrees   CHL IP OTHER RECOMMENDATIONS 10/17/2019 Recommended Consults -- Oral Care Recommendations Oral care BID Other Recommendations --   CHL IP FOLLOW UP RECOMMENDATIONS 10/17/2019 Follow up Recommendations Skilled Nursing facility   [facility] IP FREQUENCY AND DURATION 10/17/2019 Speech Therapy Frequency (ACUTE ONLY) min 2x/week Treatment Duration 2 weeks      CHL IP ORAL PHASE 10/17/2019 Oral Phase Impaired Oral - Pudding Teaspoon -- Oral - Pudding Cup -- Oral -  Honey Teaspoon -- Oral - Honey Cup -- Oral - Nectar Teaspoon --  Oral - Nectar Cup Delayed oral transit;Decreased bolus cohesion Oral - Nectar Straw -- Oral - Thin Teaspoon -- Oral - Thin Cup Delayed oral transit;Decreased bolus cohesion Oral - Thin Straw Delayed oral transit;Decreased bolus cohesion Oral - Puree Delayed oral transit;Decreased bolus cohesion;Lingual/palatal residue Oral - Mech Soft Delayed oral transit;Decreased bolus cohesion;Lingual/palatal residue;Impaired mastication Oral - Regular -- Oral - Multi-Consistency -- Oral - Pill -- Oral Phase - Comment --  CHL IP PHARYNGEAL PHASE 10/17/2019 Pharyngeal Phase Impaired Pharyngeal- Pudding Teaspoon -- Pharyngeal -- Pharyngeal- Pudding Cup -- Pharyngeal -- Pharyngeal- Honey Teaspoon -- Pharyngeal -- Pharyngeal- Honey Cup -- Pharyngeal -- Pharyngeal- Nectar Teaspoon -- Pharyngeal -- Pharyngeal- Nectar Cup Reduced tongue base retraction;Reduced pharyngeal peristalsis;Pharyngeal residue - valleculae;Pharyngeal residue - pyriform;Penetration/Aspiration during swallow Pharyngeal Material enters airway, remains ABOVE vocal cords and not ejected out Pharyngeal- Nectar Straw -- Pharyngeal -- Pharyngeal- Thin Teaspoon -- Pharyngeal -- Pharyngeal- Thin Cup Reduced tongue base retraction;Reduced pharyngeal peristalsis;Pharyngeal residue - valleculae;Pharyngeal residue - pyriform;Penetration/Aspiration during swallow Pharyngeal Material enters airway, passes BELOW cords without attempt by patient to eject out (silent aspiration) Pharyngeal- Thin Straw Reduced tongue base retraction;Reduced pharyngeal peristalsis;Pharyngeal residue - valleculae;Pharyngeal residue - pyriform;Penetration/Aspiration during swallow Pharyngeal Material enters airway, passes BELOW cords without attempt by patient to eject out (silent aspiration) Pharyngeal- Puree Reduced tongue base retraction;Reduced pharyngeal peristalsis;Pharyngeal residue - valleculae;Pharyngeal residue - pyriform Pharyngeal -- Pharyngeal- Mechanical Soft Reduced tongue base  retraction;Reduced pharyngeal peristalsis;Pharyngeal residue - valleculae;Pharyngeal residue - pyriform Pharyngeal -- Pharyngeal- Regular -- Pharyngeal -- Pharyngeal- Multi-consistency -- Pharyngeal -- Pharyngeal- Pill -- Pharyngeal -- Pharyngeal Comment --  CHL IP CERVICAL ESOPHAGEAL PHASE 10/17/2019 Cervical Esophageal Phase WFL Pudding Teaspoon -- Pudding Cup -- Honey Teaspoon -- Honey Cup -- Nectar Teaspoon -- Nectar Cup -- Nectar Straw -- Thin Teaspoon -- Thin Cup -- Thin Straw -- Puree -- Mechanical Soft -- Regular -- Multi-consistency -- Pill -- Cervical Esophageal Comment -- Osie Bond., M.A. [place] Pager [phone] Office [phone] 10/17/2019, 5:03 PM              ECHOCARDIOGRAM COMPLETE  Result Date: 10/18/2019   ECHOCARDIOGRAM REPORT   Patient Name:   Robert Cuevas Date of Exam: 10/18/2019 Medical Rec #:  [id number]      Height:       68.0 in Accession #:    [id number]     Weight:       126.1 lb Date of Birth:  1930/09/16      BSA:          1.68 m Patient Age:    73 years       BP:           138/53 mmHg Patient Gender: M              HR:           70 bpm. Exam Location:  Inpatient Procedure: 2D Echo, Cardiac Doppler and Color Doppler Indications:    Dyspnea 786.09  History:        Patient has prior history of Echocardiogram examinations, most                 recent 01/10/2015. Pacemaker, Arrythmias:Atrial Fibrillation,                 Signs/Symptoms:Hypotension; Risk Factors:Diabetes. Sepsis.                 Complete  heart block.  Sonographer:    Paulita Fujita RDCS Referring Phys: [id number] [facility]  Sonographer Comments: Suboptimal parasternal window. IMPRESSIONS  1. Left ventricular ejection fraction, by visual estimation, is 45 to 50%. The left ventricle has normal function. There is no left ventricular hypertrophy.  2. The left ventricle demonstrates regional wall motion abnormalities.  3. Interventricular septal dyssynchrony and hypokinesis consistent  with RV pacing.  4. Global right ventricle has normal systolic function.The right ventricular size is moderately enlarged. No increase in right ventricular wall thickness.  5. Left atrial size was normal.  6. Right atrial size was normal.  7. Mild mitral annular calcification.  8. The mitral valve is normal in structure. No evidence of mitral valve regurgitation. No evidence of mitral stenosis.  9. The tricuspid valve is normal in structure. 10. The aortic valve is normal in structure. Aortic valve regurgitation is mild. Mild aortic valve stenosis. 11. The pulmonic valve was normal in structure. Pulmonic valve regurgitation is not visualized. 12. Moderately elevated pulmonary artery systolic pressure. 13. A pacer wire is visualized. 14. The inferior vena cava is normal in size with <50% respiratory variability, suggesting right atrial pressure of 8 mmHg. FINDINGS  Left Ventricle: Left ventricular ejection fraction, by visual estimation, is 45 to 50%. The left ventricle has normal function. The left ventricle demonstrates regional wall motion abnormalities. There is no left ventricular hypertrophy. Normal left atrial pressure. Interventricular septal dyssynchrony and hypokinesis consistent with RV pacing. Right Ventricle: The right ventricular size is moderately enlarged. No increase in right ventricular wall thickness. Global RV systolic function is has normal systolic function. The tricuspid regurgitant velocity is 3.01 m/s, and with an assumed right atrial pressure of 8 mmHg, the estimated right ventricular systolic pressure is moderately elevated at 44.2 mmHg. Left Atrium: Left atrial size was normal in size. Right Atrium: Right atrial size was normal in size Pericardium: There is no evidence of pericardial effusion. Mitral Valve: The mitral valve is normal in structure. Mild mitral annular calcification. No evidence of mitral valve regurgitation. No evidence of mitral valve stenosis by observation. Tricuspid  Valve: The tricuspid valve is normal in structure. Tricuspid valve regurgitation moderate-severe. Aortic Valve: The aortic valve is normal in structure.. There is moderate thickening and moderate calcification of the aortic valve. Aortic valve regurgitation is mild. Mild aortic stenosis is present. Moderate aortic valve annular calcification. There is moderate thickening of the aortic valve. There is moderate calcification of the aortic valve. Pulmonic Valve: The pulmonic valve was normal in structure. Pulmonic valve regurgitation is not visualized. Pulmonic regurgitation is not visualized. Aorta: The aortic root, ascending aorta and aortic arch are all structurally normal, with no evidence of dilitation or obstruction. Venous: The inferior vena cava is normal in size with less than 50% respiratory variability, suggesting right atrial pressure of 8 mmHg. IAS/Shunts: No atrial level shunt detected by color flow Doppler. There is no evidence of a patent foramen ovale. No ventricular septal defect is seen or detected. There is no evidence of an atrial septal defect. Additional Comments: A pacer wire is visualized.  LEFT VENTRICLE PLAX 2D LVIDd:         3.60 cm LVIDs:         2.70 cm LV PW:         1.00 cm LV IVS:        1.00 cm LVOT diam:     1.70 cm LV SV:         27 ml LV  SV Index:   16.64 LVOT Area:     2.27 cm  LV Volumes (MOD) LV area d, A2C:    20.20 cm LV area d, A4C:    18.90 cm LV area s, A2C:    16.60 cm LV area s, A4C:    14.60 cm LV major d, A2C:   5.61 cm LV major d, A4C:   6.32 cm LV major s, A2C:   5.64 cm LV major s, A4C:   5.57 cm LV vol d, MOD A2C: 60.2 ml LV vol d, MOD A4C: 47.8 ml LV vol s, MOD A2C: 39.2 ml LV vol s, MOD A4C: 32.4 ml LV SV MOD A2C:     21.0 ml LV SV MOD A4C:     47.8 ml LV SV MOD BP:      21.0 ml RIGHT VENTRICLE RV S prime:     13.60 cm/s TAPSE (M-mode): 2.3 cm LEFT ATRIUM           Index       RIGHT ATRIUM           Index LA diam:      3.60 cm 2.14 cm/m  RA Area:     14.10  cm LA Vol (A2C): 34.5 ml 20.54 ml/m RA Volume:   39.80 ml  23.69 ml/m LA Vol (A4C): 47.5 ml 28.27 ml/m  AORTIC VALVE LVOT Vmax:   71.80 cm/s LVOT Vmean:  47.100 cm/s LVOT VTI:    0.113 m  AORTA Ao Root diam: 3.40 cm TRICUSPID VALVE TR Peak grad:   36.2 mmHg TR Vmax:        301.00 cm/s  SHUNTS Systemic VTI:  0.11 m Systemic Diam: 1.70 cm  Skeet Latch MD Electronically signed by Skeet Latch MD Signature Date/Time: 10/18/2019/2:46:13 PM    Final      Labs:   Basic Metabolic Panel: Recent Labs  Lab 10/17/19 [id number] 10/18/19 0147 10/19/19 0126 10/20/19 0526 10/21/19 0432 10/22/19 0432  NA  --  139 139 138 144 142  K  --  3.6 2.9* 3.5 4.4 3.2*  CL  --  100 97* 96* 101 101  CO2  --  25 30 31  35* 31  GLUCOSE  --  209* 233* 289* 198* 146*  BUN  --  10 17 35* 42* 37*  CREATININE  --  0.49* 0.47* 0.53* 0.65 0.42*  CALCIUM  --  8.7* 8.8* 8.7* 9.0 8.4*  MG 1.6* 1.8 1.8  --   --   --   PHOS 3.4  --   --   --   --   --    GFR Estimated Creatinine Clearance: 52.6 mL/min (A) (by C-G formula based on SCr of 0.42 mg/dL (L)).  CBC: Recent Labs  Lab 10/16/19 0344 10/17/19 0421 10/18/19 0147  WBC 5.9 7.1 6.7  NEUTROABS  --  6.3  --   HGB 15.3 15.9 15.9  HCT 45.9 47.7 47.6  MCV 94.3 95.4 94.3  PLT 151 150 176   CBG: Recent Labs  Lab 10/21/19 1605 10/21/19 1730 10/21/19 1829 10/21/19 2017 10/22/19 1135  GLUCAP 110* 50* 81 179* 109*   Microbiology Recent Results (from the past 240 hour(s))  Body fluid culture (includes gram stain)     Status: None   Collection Time: 10/17/19  2:46 PM   Specimen: Pleural Fluid  Result Value Ref Range Status   Specimen Description   Final    PLEURAL Performed at [facility], [address] [place], [place] [address]    Special Requests   Final    NONE Performed at [facility], [place] [address] [place], [place] [address]    Gram Stain NO WBC SEEN NO ORGANISMS SEEN   Final   Culture   Final     NO GROWTH 3 DAYS Performed at [facility], [place] [address], [place], [place] [address]    Report Status 10/21/2019 FINAL  Final     Discharge Instructions:   Discharge Instructions    Call MD for:  difficulty breathing, headache or visual disturbances   Complete by: As directed    Call MD for:  extreme fatigue   Complete by: As directed    Call MD for:  persistant dizziness or light-headedness   Complete by: As directed    Call MD for:  persistant nausea and vomiting   Complete by: As directed    Call MD for:  severe uncontrolled pain   Complete by: As directed    Call MD for:  temperature >100.4   Complete by: As directed    Diet - low sodium heart healthy   Complete by: As directed    Diet Carb Modified   Complete by: As directed    For home use only DME oxygen   Complete by: As directed    Length of Need: 6 Months   Liters per Minute: 4   Frequency: Continuous (stationary and portable oxygen unit needed)   Oxygen conserving device: Yes   Oxygen delivery system: Gas   Increase activity slowly   Complete by: As directed    Increase activity slowly   Complete by: As directed      Allergies as of 10/22/2019   No Known Allergies     Medication List    STOP taking these medications   predniSONE 20 MG tablet Commonly known as: DELTASONE   ramipril 2.5 MG capsule Commonly known as: ALTACE     TAKE these medications   acetaminophen 325 MG tablet Commonly known as: TYLENOL Take 2 tablets (650 mg total) by mouth every 6 (six) hours as needed for mild pain (or Fever >/= 101).   albuterol 108 (90 Base) MCG/ACT inhaler Commonly known as: VENTOLIN HFA Inhale 2 puffs into the lungs every 6 (six) hours as needed for wheezing or shortness of breath.   apixaban 5 MG Tabs tablet Commonly known as: ELIQUIS Take 1 tablet (5 mg total) by mouth 2 (two) times daily.   atorvastatin 20 MG tablet Commonly known as: LIPITOR Take 1 tablet (20 mg total) by mouth daily.     carboxymethylcellulose 0.5 % Soln Commonly known as: REFRESH PLUS Place 1 drop into both eyes daily as needed (dry eyes).   cholecalciferol 25 MCG (1000 UT) tablet Commonly known as: VITAMIN D3 Take 1,000 Units by mouth daily.   diclofenac Sodium 1 % Gel Commonly known as: VOLTAREN Apply 1 application topically daily as needed (knee pain).   Fish Oil 1200 MG Caps Take 2,400 mg by mouth daily.   metFORMIN 500 MG tablet Commonly known as: GLUCOPHAGE Take 500 mg by mouth 2 (two) times daily with a meal.   metoprolol tartrate 50 MG tablet Commonly known as: LOPRESSOR Take 1 tablet (50 mg total) by mouth 2 (two) times daily.   Resource ThickenUp Clear Powd Thicken liquids to nectar consistency.   Santyl ointment Generic drug: collagenase Apply 1 application topically daily. Apply nickel thickness amount to sacrum wound  daily            Durable Medical Equipment  (From admission, onward)         Start     Ordered   10/22/19 1100  For home use only DME Walker rolling  Once    Comments: 5" wheels  Question:  Patient needs a walker to treat with the following condition  Answer:  Physical deconditioning   10/22/19 1100   10/22/19 0000  For home use only DME oxygen    Question Answer Comment  Length of Need 6 Months   Liters per Minute 4   Frequency Continuous (stationary and portable oxygen unit needed)   Oxygen conserving device Yes   Oxygen delivery system Gas      10/22/19 1050   10/21/19 1419  For home use only DME Walker rolling  Once    Comments: With 5 inch wheels  Question:  Patient needs a walker to treat with the following condition  Answer:  Physical deconditioning   10/21/19 1418   10/21/19 1419  For home use only DME oxygen  Once    Question Answer Comment  Length of Need 6 Months   Mode or (Route) Nasal cannula   Liters per Minute 4   Frequency Continuous (stationary and portable oxygen unit needed)   Oxygen conserving device Yes   Oxygen  delivery system Gas      10/21/19 1418         Follow-up Information    [facility]. Schedule an appointment as soon as possible for a visit in 1 week(s).   Why: Hospital follow up. Contact information: [place] [address] [phone]            Time coordinating discharge: 35 minutes.  SignedMargreta Journey Edom Schmuhl  Pager [phone] [facility] 10/22/2019, 11:50 AM

## 2019-10-22 NOTE — Progress Notes (Signed)
SATURATION QUALIFICATIONS: (This note is used to comply with regulatory documentation for home oxygen)  Patient Saturations on Room Air at Rest = 80%   Patient Saturations on Room Air while Ambulating = pt currently unable to tolerate ambulation   Patient Saturations on 4 Liters of oxygen while Ambulating = pt was unable to tolerate ambulation; severe weakness   Please briefly explain why patient needs home oxygen: patient will need home o2 to maintain appropriate saturation level; currently sats in 70's-80's while on RA.

## 2019-10-22 NOTE — Progress Notes (Signed)
Progress Note    Robert Cuevas  [id number] DOB: [birth year]  DOA: 10/17/2019 PCP: [facility]    Brief Narrative:   Chief complaint: Follow-up hypoxia/shortness of breath  Medical records reviewed and are as summarized below:  Robert Cuevas is an 86 y.o. male with a PMH of hypertension, hyperlipidemia, PAF on chronic Eliquis, complete heart block status post pacemaker, type 2 diabetes who was admitted from his SNF with hypoxia in the setting of recently being diagnosed with COVID-19.  He was previously hospitalized 09/25/2019-10/08/2019 at which time he received a treatment course of IV remdesivir and Decadron, was subsequently readmitted 10/11/2019-10/16/2019 with severe sepsis (POA at that time) and hypoxia secondary to aspiration pneumonia/UTI.  Readmitted a third time 10/17/19 with ongoing hypoxia requiring increasing oxygen.  CT angiogram showed nonobstructive segmental pulmonary emboli in the right lower lobe and severe multilobar pneumonia with moderate right and small left pleural effusions.  Assessment/Plan:   Principal Problem:   Acute on chronic respiratory failure with hypoxia (HCC) secondary to multilobar pneumonia in the setting of recent COVID-19 associated pneumonia and aspiration complicated by pulmonary emboli The patient presented with pneumonia but not sepsis on admission.  At this time, primary driver of the patient's pneumonia felt to be secondary bacterial pneumonia in the setting of aspiration and he is therefore being covered for healthcare associated pathogens including Pseudomonas (had prior pseudomonal UTI).  Continue cefepime x 7 days.  He also continues on steroids and supplemental oxygen.  Status post speech therapy evaluation with dysphagia 1 diet recommended. Given the patient's recurrent readmissions and persistent hypoxia, a palliative care consultation was performed 10/19/2019 at this time, the patient and family continue to want the full  scope of treatment with the hope of discharging home.  Blood cultures negative.  Wean oxygen as tolerated, continues on supplemental oxygen intermittently and drops his oxygen saturation with any activity.  Will need home oxygen.  Was going to discharge him today but he was unable to stand and significantly dropped his oxygen saturations with minimal activity so discharge was canceled.  It is unlikely that the patient's wife will be able to handle his care at home without 24/7 help.  Active Problems:   Bilateral pulmonary emboli Continue Eliquis.  Would not increase dose given that these emboli are small.    Orthostatic hypotension/Elevated BUN Thought to be secondary to treatment with Lasix which has been discontinued.    Enterococcus/Pseudomonas UTI Remains on cefepime which should cover.    Hypokalemia Potassium low again this morning, will increase routine supplementation to 20 mEq of potassium chloride twice daily.  Should resolve with discontinuation of Lasix.    Bilateral pleural effusions Underwent right-sided thoracentesis 10/17/2019 with 700 cc of fluid removed.  Left-sided pleural effusion is small.  Pleural fluid cultures were negative.     Dysphagia Evaluated by speech therapy, continue dysphagia 1 diet with nectar thickened liquids.    Paroxysmal atrial fibrillation (HCC)/Long term (current) use of anticoagulants/status post pacemaker Continue metoprolol.  2D echo shows EF 45-50% with regional wall motion abnormalities.    COVID-19 virus infection No longer felt to be shedding virus and therefore is off isolation.    Diabetes mellitus type II, non insulin dependent ([place]), uncontrolled Hemoglobin A1c 8.4% on 09/25/2019.  Metformin on hold.  Had markedly elevated blood glucoses yesterday requiring several as needed doses of NovoLog to get his sugars down.  Currently being managed with Lantus 20 units daily, resistant scale SSI and 8  units of meal coverage.  CBGs 50-231.   Glycemic control should improve with steroid taper.    Pressure ulcer, stage II to sacrum, present prior to admission Continue wound care per nursing.    Hyperlipidemia Continue Lipitor.    Severe protein calorie malnutrition/underweight  Body mass index is 19.17 kg/m.  Dietitian consultation requested and performed.  Noted to have severe fat depletion and severe muscle depletion consistent with severe malnutrition.  Continue supplements as ordered.    Family Communication/Anticipated D/C date and plan/Code Status   DVT prophylaxis: On chronic Eliquis. Code Status: DNR.  Family Communication: Updated spouse by telephone. Disposition Plan: Leading to discharge today however patient unsafe to discharge home with degree of hypoxia, activity intolerance.   Medical Consultants:    Critical care  Palliative care   Anti-Infectives:    Cefepime 10/17/2019--->   Subjective:   Seems irritable today.  Complains of back pain and chest pain.  Nurses report he is got a small decubitus ulcer to his sacral area.  He gets extremely short of breath and drops his oxygen saturations significantly with even minimal activity.  Objective:    Vitals:   10/21/19 1308 10/21/19 1544 10/21/19 2100 10/22/19 0500  BP: (!) 121/59 112/62 120/66 118/70  Pulse: 70 70 70 68  Resp: 18 (!) 22 20 20   Temp: (!) 97.4 F (36.3 C)  97.9 F (36.6 C) 98 F (36.7 C)  TempSrc: Oral  Oral Oral  SpO2: 97% 95% 96% 98%  Weight:      Height:        Intake/Output Summary (Last 24 hours) at 10/22/2019 0733 Last data filed at 10/22/2019 0600 Gross per 24 hour  Intake 2695.02 ml  Output 1327 ml  Net 1368.02 ml   Filed Weights   10/17/19 0304 10/17/19 1800  Weight: 62.1 kg 57.2 kg    Exam: General: Frail appearing. Cardiovascular: Heart sounds show a regular rate, and rhythm. No gallops or rubs. No murmurs. No JVD. Lungs: Nourished and becomes tachypneic with even minimal activity. Abdomen: Soft,  nontender, nondistended with normal active bowel sounds. No masses. No hepatosplenomegaly. Skin: Warm and dry.  Stage II sacral ulcer. Extremities: No clubbing or cyanosis. No edema. Pedal pulses 2+.  Pressure Injury 10/17/19 Sacrum Medial Stage 2 -  Partial thickness loss of dermis presenting as a shallow open injury with a red, pink wound bed without slough. (Active)  10/17/19 1742  Location: Sacrum  Location Orientation: Medial  Staging: Stage 2 -  Partial thickness loss of dermis presenting as a shallow open injury with a red, pink wound bed without slough.  Wound Description (Comments):   Present on Admission: Yes    Data Reviewed:   I have personally reviewed following labs and imaging studies:  Labs: Labs show the following:   Basic Metabolic Panel: Recent Labs  Lab 10/17/19 0822 10/18/19 0147 10/19/19 0126 10/20/19 0526 10/21/19 0432 10/22/19 0432  NA  --  139 139 138 144 142  K  --  3.6 2.9* 3.5 4.4 3.2*  CL  --  100 97* 96* 101 101  CO2  --  25 30 31  35* 31  GLUCOSE  --  209* 233* 289* 198* 146*  BUN  --  10 17 35* 42* 37*  CREATININE  --  0.49* 0.47* 0.53* 0.65 0.42*  CALCIUM  --  8.7* 8.8* 8.7* 9.0 8.4*  MG 1.6* 1.8 1.8  --   --   --   PHOS 3.4  --   --   --   --   --  GFR Estimated Creatinine Clearance: 52.6 mL/min (A) (by C-G formula based on SCr of 0.42 mg/dL (L)).  CBC: Recent Labs  Lab 10/16/19 0344 10/17/19 0421 10/18/19 0147  WBC 5.9 7.1 6.7  NEUTROABS  --  6.3  --   HGB 15.3 15.9 15.9  HCT 45.9 47.7 47.6  MCV 94.3 95.4 94.3  PLT 151 150 176   CBG: Recent Labs  Lab 10/21/19 1439 10/21/19 1605 10/21/19 1730 10/21/19 1829 10/21/19 2017  GLUCAP 231* 110* 50* 81 179*    Microbiology Recent Results (from the past 240 hour(s))  Body fluid culture (includes gram stain)     Status: None   Collection Time: 10/17/19  2:46 PM   Specimen: Pleural Fluid  Result Value Ref Range Status   Specimen Description   Final    PLEURAL Performed  at [facility], [place] [address], [place], [place] [address]    Special Requests   Final    NONE Performed at [facility], [place] [address] [place], [place] [address]    Gram Stain NO WBC SEEN NO ORGANISMS SEEN   Final   Culture   Final    NO GROWTH 3 DAYS Performed at [facility], [place] [address], [place], [place] [address]    Report Status 10/21/2019 FINAL  Final    Procedures and diagnostic studies:  No results found.  Medications:   . apixaban  5 mg Oral BID  . atorvastatin  20 mg Oral Daily  . chlorhexidine  15 mL Mouth Rinse BID  . Chlorhexidine Gluconate Cloth  6 each Topical Daily  . cholecalciferol  1,000 Units Oral Daily  . collagenase  1 application Topical Daily  . furosemide  40 mg Intravenous BID  . insulin aspart  0-20 Units Subcutaneous TID WC  . insulin aspart  0-5 Units Subcutaneous QHS  . insulin aspart  10 Units Subcutaneous TID WC  . insulin glargine  25 Units Subcutaneous Daily  . mouth rinse  15 mL Mouth Rinse q12n4p  . methylPREDNISolone (SOLU-MEDROL) injection  40 mg Intravenous Daily  . metoprolol tartrate  50 mg Oral BID  . multivitamin with minerals  1 tablet Oral Daily  . omega-3 acid ethyl esters  1 g Oral BID  . potassium chloride  10 mEq Oral BID   Continuous Infusions: . sodium chloride 100 mL/hr at 10/21/19 1505  . ceFEPime (MAXIPIME) IV 2 g (10/21/19 2249)     LOS: 5 days     Margreta Journey Rafeef Lau  [facility]  [facility] How to contact the [facility] Attending or Consulting provider [place] or covering provider during after hours [place], for this patient?  1. Check the care team in [facility] and look for a) attending/consulting TR[facility] provider listed and b) the [facility] team listed 2. Log into [email or web address] and use 's universal password to access. If you do not have the password, please contact the hospital operator. 3. Locate the [facility] provider you are looking for under [facility] and page to a number that you can be directly reached. 4. If you still have difficulty reaching the provider, please page the [facility] (Director on Call) for the Hospitalists listed on amion for assistance.  10/22/2019, 7:33 AM

## 2019-10-22 NOTE — Progress Notes (Signed)
Patient's BP was 90/52 at 2054.Manual recheck at 2155 prior to medication was 95/50. Dr Humphrey Rolls was informed at 2158 and order to hold 2200 Lopressor dose received. Recheck of BP at 2334 was 108/50.

## 2019-10-22 NOTE — TOC Progression Note (Signed)
Transition of Care [facility]) - Progression Note    Patient Details  Name: Robert Cuevas MRN: [id number] Date of Birth: 05/04/1930  Transition of Care [facility]) CM/SW Contact  Joaquin Courts, RN Phone Number: 10/22/2019, 1:16 PM  Clinical Narrative:    CM confirmed with Alvis Lemmings rep that agency will provide HHPT/OT services. [employer] arranged to deliver rolling walker and oxygen.    Expected Discharge Plan: [place]    Expected Discharge Plan and Services Expected Discharge Plan: [place]   Discharge Planning Services: CM Consult     Expected Discharge Date: 10/22/19               DME Arranged: Oxygen, Walker rolling DME Agency: [employer] Date DME Agency Contacted: 10/22/19 Time DME Agency Contacted: [phone] Representative spoke with at DME Agency: [place]) Interventions    Readmission Risk Interventions Readmission Risk Prevention Plan 10/02/2019  Medication Review (RN CM) Complete  Some recent data might be hidden

## 2019-10-22 NOTE — Discharge Instructions (Signed)
Acute Respiratory Failure, Adult  Acute respiratory failure occurs when there is not enough oxygen passing from your lungs to your body. When this happens, your lungs have trouble removing carbon dioxide from the blood. This causes your blood oxygen level to drop too low as carbon dioxide builds up. Acute respiratory failure is a medical emergency. It can develop quickly, but it is temporary if treated promptly. Your lung capacity, or how much air your lungs can hold, may improve with time, exercise, and treatment. What are the causes? There are many possible causes of acute respiratory failure, including:  Lung injury.  Chest injury or damage to the ribs or tissues near the lungs.  Lung conditions that affect the flow of air and blood into and out of the lungs, such as pneumonia, acute respiratory distress syndrome, and cystic fibrosis.  Medical conditions, such as strokes or spinal cord injuries, that affect the muscles and nerves that control breathing.  Blood infection (sepsis).  Inflammation of the pancreas (pancreatitis).  A blood clot in the lungs (pulmonary embolism).  A large-volume blood transfusion.  Burns.  Near-drowning.  Seizure.  Smoke inhalation.  Reaction to medicines.  Alcohol or drug overdose. What increases the risk? This condition is more likely to develop in people who have:  A blocked airway.  Asthma.  A condition or disease that damages or weakens the muscles, nerves, bones, or tissues that are involved in breathing.  A serious infection.  A health problem that blocks the unconscious reflex that is involved in breathing, such as hypothyroidism or sleep apnea.  A lung injury or trauma. What are the signs or symptoms? Trouble breathing is the main symptom of acute respiratory failure. Symptoms may also include:  Rapid breathing.  Restlessness or anxiety.  Skin, lips, or fingernails that appear blue (cyanosis).  Rapid heart  rate.  Abnormal heart rhythms (arrhythmias).  Confusion or changes in behavior.  Tiredness or loss of energy.  Feeling sleepy or having a loss of consciousness. How is this diagnosed? Your health care provider can diagnose acute respiratory failure with a medical history and physical exam. During the exam, your health care provider will listen to your heart and check for crackling or wheezing sounds in your lungs. Your may also have tests to confirm the diagnosis and determine what is causing respiratory failure. These tests may include:  Measuring the amount of oxygen in your blood (pulse oximetry). The measurement comes from a small device that is placed on your finger, earlobe, or toe.  Other blood tests to measure blood gases and to look for signs of infection.  Sampling your cerebral spinal fluid or tracheal fluid to check for infections.  Chest X-ray to look for fluid in spaces that should be filled with air.  Electrocardiogram (ECG) to look at the heart's electrical activity. How is this treated? Treatment for this condition usually takes places in a hospital intensive care unit (ICU). Treatment depends on what is causing the condition. It may include one or more treatments until your symptoms improve. Treatment may include:  Supplemental oxygen. Extra oxygen is given through a tube in the nose, a face mask, or a hood.  A device such as a continuous positive airway pressure (CPAP) or bi-level positive airway pressure (BiPAP or BPAP) machine. This treatment uses mild air pressure to keep the airways open. A mask or other device will be placed over your nose or mouth. A tube that is connected to a motor will deliver oxygen through the   mask.  Ventilator. This treatment helps move air into and out of the lungs. This may be done with a bag and mask or a machine. For this treatment, a tube is placed in your windpipe (trachea) so air and oxygen can flow to the lungs.  Extracorporeal  membrane oxygenation (ECMO). This treatment temporarily takes over the function of the heart and lungs, supplying oxygen and removing carbon dioxide. ECMO gives the lungs a chance to recover. It may be used if a ventilator is not effective.  Tracheostomy. This is a procedure that creates a hole in the neck to insert a breathing tube.  Receiving fluids and medicines.  Rocking the bed to help breathing. Follow these instructions at home:  Take over-the-counter and prescription medicines only as told by your health care provider.  Return to normal activities as told by your health care provider. Ask your health care provider what activities are safe for you.  Keep all follow-up visits as told by your health care provider. This is important. How is this prevented? Treating infections and medical conditions that may lead to acute respiratory failure can help prevent the condition from developing. Contact a health care provider if:  You have a fever.  Your symptoms do not improve or they get worse. Get help right away if:  You are having trouble breathing.  You lose consciousness.  Your have cyanosis or turn blue.  You develop a rapid heart rate.  You are confused. These symptoms may represent a serious problem that is an emergency. Do not wait to see if the symptoms will go away. Get medical help right away. Call your local emergency services (911 in the [place] Do not drive yourself to the hospital. This information is not intended to replace advice given to you by your health care provider. Make sure you discuss any questions you have with your health care provider. Document Revised: 09/18/2017 Document Reviewed: 04/23/2016 Elsevier Patient Education  2020 [employer].   COVID-19 COVID-19 is a respiratory infection that is caused by a virus called severe acute respiratory syndrome coronavirus 2 (SARS-CoV-2). The disease is also known as coronavirus disease or novel coronavirus.  In some people, the virus may not cause any symptoms. In others, it may cause a serious infection. The infection can get worse quickly and can lead to complications, such as:  Pneumonia, or infection of the lungs.  Acute respiratory distress syndrome or ARDS. This is a condition in which fluid build-up in the lungs prevents the lungs from filling with air and passing oxygen into the blood.  Acute respiratory failure. This is a condition in which there is not enough oxygen passing from the lungs to the body or when carbon dioxide is not passing from the lungs out of the body.  Sepsis or septic shock. This is a serious bodily reaction to an infection.  Blood clotting problems.  Secondary infections due to bacteria or fungus.  Organ failure. This is when your body's organs stop working. The virus that causes COVID-19 is contagious. This means that it can spread from person to person through droplets from coughs and sneezes (respiratory secretions). What are the causes? This illness is caused by a virus. You may catch the virus by:  Breathing in droplets from an infected person. Droplets can be spread by a person breathing, speaking, singing, coughing, or sneezing.  Touching something, like a table or a doorknob, that was exposed to the virus (contaminated) and then touching your mouth, nose, or eyes.  What increases the risk? Risk for infection You are more likely to be infected with this virus if you:  Are within 6 feet (2 meters) of a person with COVID-19.  Provide care for or live with a person who is infected with COVID-19.  Spend time in crowded indoor spaces or live in shared housing. Risk for serious illness You are more likely to become seriously ill from the virus if you:  Are 18 years of age or older. The higher your age, the more you are at risk for serious illness.  Live in a nursing home or long-term care facility.  Have cancer.  Have a long-term (chronic) disease  such as: ? Chronic lung disease, including chronic obstructive pulmonary disease or asthma. ? A long-term disease that lowers your body's ability to fight infection (immunocompromised). ? Heart disease, including heart failure, a condition in which the arteries that lead to the heart become narrow or blocked (coronary artery disease), a disease which makes the heart muscle thick, weak, or stiff (cardiomyopathy). ? Diabetes. ? Chronic kidney disease. ? Sickle cell disease, a condition in which red blood cells have an abnormal "sickle" shape. ? Liver disease.  Are obese. What are the signs or symptoms? Symptoms of this condition can range from mild to severe. Symptoms may appear any time from 2 to 14 days after being exposed to the virus. They include:  A fever or chills.  A cough.  Difficulty breathing.  Headaches, body aches, or muscle aches.  Runny or stuffy (congested) nose.  A sore throat.  New loss of taste or smell. Some people may also have stomach problems, such as nausea, vomiting, or diarrhea. Other people may not have any symptoms of COVID-19. How is this diagnosed? This condition may be diagnosed based on:  Your signs and symptoms, especially if: ? You live in an area with a COVID-19 outbreak. ? You recently traveled to or from an area where the virus is common. ? You provide care for or live with a person who was diagnosed with COVID-19. ? You were exposed to a person who was diagnosed with COVID-19.  A physical exam.  Lab tests, which may include: ? Taking a sample of fluid from the back of your nose and throat (nasopharyngeal fluid), your nose, or your throat using a swab. ? A sample of mucus from your lungs (sputum). ? Blood tests.  Imaging tests, which may include, X-rays, CT scan, or ultrasound. How is this treated? At present, there is no medicine to treat COVID-19. Medicines that treat other diseases are being used on a trial basis to see if they are  effective against COVID-19. Your health care provider will talk with you about ways to treat your symptoms. For most people, the infection is mild and can be managed at home with rest, fluids, and over-the-counter medicines. Treatment for a serious infection usually takes places in a hospital intensive care unit (ICU). It may include one or more of the following treatments. These treatments are given until your symptoms improve.  Receiving fluids and medicines through an IV.  Supplemental oxygen. Extra oxygen is given through a tube in the nose, a face mask, or a hood.  Positioning you to lie on your stomach (prone position). This makes it easier for oxygen to get into the lungs.  Continuous positive airway pressure (CPAP) or bi-level positive airway pressure (BPAP) machine. This treatment uses mild air pressure to keep the airways open. A tube that is connected to  a motor delivers oxygen to the body.  Ventilator. This treatment moves air into and out of the lungs by using a tube that is placed in your windpipe.  Tracheostomy. This is a procedure to create a hole in the neck so that a breathing tube can be inserted.  Extracorporeal membrane oxygenation (ECMO). This procedure gives the lungs a chance to recover by taking over the functions of the heart and lungs. It supplies oxygen to the body and removes carbon dioxide. Follow these instructions at home: Lifestyle  If you are sick, stay home except to get medical care. Your health care provider will tell you how long to stay home. Call your health care provider before you go for medical care.  Rest at home as told by your health care provider.  Do not use any products that contain nicotine or tobacco, such as cigarettes, e-cigarettes, and chewing tobacco. If you need help quitting, ask your health care provider.  Return to your normal activities as told by your health care provider. Ask your health care provider what activities are safe for  you. General instructions  Take over-the-counter and prescription medicines only as told by your health care provider.  Drink enough fluid to keep your urine pale yellow.  Keep all follow-up visits as told by your health care provider. This is important. How is this prevented?  There is no vaccine to help prevent COVID-19 infection. However, there are steps you can take to protect yourself and others from this virus. To protect yourself:   Do not travel to areas where COVID-19 is a risk. The areas where COVID-19 is reported change often. To identify high-risk areas and travel restrictions, check the CDC travel website: FatFares.com.br  If you live in, or must travel to, an area where COVID-19 is a risk, take precautions to avoid infection. ? Stay away from people who are sick. ? Wash your hands often with soap and water for 20 seconds. If soap and water are not available, use an alcohol-based hand sanitizer. ? Avoid touching your mouth, face, eyes, or nose. ? Avoid going out in public, follow guidance from your state and local health authorities. ? If you must go out in public, wear a cloth face covering or face mask. Make sure your mask covers your nose and mouth. ? Avoid crowded indoor spaces. Stay at least 6 feet (2 meters) away from others. ? Disinfect objects and surfaces that are frequently touched every day. This may include:  Counters and tables.  Doorknobs and light switches.  Sinks and faucets.  Electronics, such as phones, remote controls, keyboards, computers, and tablets. To protect others: If you have symptoms of COVID-19, take steps to prevent the virus from spreading to others.  If you think you have a COVID-19 infection, contact your health care provider right away. Tell your health care team that you think you may have a COVID-19 infection.  Stay home. Leave your house only to seek medical care. Do not use public transport.  Do not travel while you  are sick.  Wash your hands often with soap and water for 20 seconds. If soap and water are not available, use alcohol-based hand sanitizer.  Stay away from other members of your household. Let healthy household members care for children and pets, if possible. If you have to care for children or pets, wash your hands often and wear a mask. If possible, stay in your own room, separate from others. Use a different bathroom.  Make  sure that all people in your household wash their hands well and often.  Cough or sneeze into a tissue or your sleeve or elbow. Do not cough or sneeze into your hand or into the air.  Wear a cloth face covering or face mask. Make sure your mask covers your nose and mouth. Where to find more information  Centers for Disease Control and Prevention: PurpleGadgets.be  World Health Organization: https://www.castaneda.info/ Contact a health care provider if:  You live in or have traveled to an area where COVID-19 is a risk and you have symptoms of the infection.  You have had contact with someone who has COVID-19 and you have symptoms of the infection. Get help right away if:  You have trouble breathing.  You have pain or pressure in your chest.  You have confusion.  You have bluish lips and fingernails.  You have difficulty waking from sleep.  You have symptoms that get worse. These symptoms may represent a serious problem that is an emergency. Do not wait to see if the symptoms will go away. Get medical help right away. Call your local emergency services (911 in the [place] Do not drive yourself to the hospital. Let the emergency medical personnel know if you think you have COVID-19. Summary  COVID-19 is a respiratory infection that is caused by a virus. It is also known as coronavirus disease or novel coronavirus. It can cause serious infections, such as pneumonia, acute respiratory distress syndrome, acute respiratory  failure, or sepsis.  The virus that causes COVID-19 is contagious. This means that it can spread from person to person through droplets from breathing, speaking, singing, coughing, or sneezing.  You are more likely to develop a serious illness if you are 76 years of age or older, have a weak immune system, live in a nursing home, or have chronic disease.  There is no medicine to treat COVID-19. Your health care provider will talk with you about ways to treat your symptoms.  Take steps to protect yourself and others from infection. Wash your hands often and disinfect objects and surfaces that are frequently touched every day. Stay away from people who are sick and wear a mask if you are sick. This information is not intended to replace advice given to you by your health care provider. Make sure you discuss any questions you have with your health care provider. Document Revised: 08/05/2019 Document Reviewed: 11/11/2018 Elsevier Patient Education  2020 [employer].   COVID-19 Frequently Asked Questions COVID-19 (coronavirus disease) is an infection that is caused by a large family of viruses. Some viruses cause illness in people and others cause illness in animals like camels, cats, and bats. In some cases, the viruses that cause illness in animals can spread to humans. Where did the coronavirus come from? In December 2019, [place] told the [facility]) of several cases of lung disease (human respiratory illness). These cases were linked to an open seafood and livestock market in the city of [place]. The link to the seafood and livestock market suggests that the virus may have spread from animals to humans. However, since that first outbreak in December, the virus has also been shown to spread from person to person. What is the name of the disease and the virus? Disease name Early on, this disease was called novel coronavirus. This is because scientists determined that the disease  was caused by a new (novel) respiratory virus. The World Health Organization [facility]) has now named the disease  COVID-19, or coronavirus disease. Virus name The virus that causes the disease is called severe acute respiratory syndrome coronavirus 2 (SARS-CoV-2). More information on disease and virus naming World Health Organization [facility]): www.who.int/emergencies/diseases/novel-coronavirus-2019/technical-guidance/naming-the-coronavirus-disease-(covid-2019)-and-the-virus-that-causes-it Who is at risk for complications from coronavirus disease? Some people may be at higher risk for complications from coronavirus disease. This includes older adults and people who have chronic diseases, such as heart disease, diabetes, and lung disease. If you are at higher risk for complications, take these extra precautions:  Stay home as much as possible.  Avoid social gatherings and travel.  Avoid close contact with others. Stay at least 6 ft (2 m) away from others, if possible.  Wash your hands often with soap and water for at least 20 seconds.  Avoid touching your face, mouth, nose, or eyes.  Keep supplies on hand at home, such as food, medicine, and cleaning supplies.  If you must go out in public, wear a cloth face covering or face mask. Make sure your mask covers your nose and mouth. How does coronavirus disease spread? The virus that causes coronavirus disease spreads easily from person to person (is contagious). You may catch the virus by:  Breathing in droplets from an infected person. Droplets can be spread by a person breathing, speaking, singing, coughing, or sneezing.  Touching something, like a table or a doorknob, that was exposed to the virus (contaminated) and then touching your mouth, nose, or eyes. Can I get the virus from touching surfaces or objects? There is still a lot that we do not know about the virus that causes coronavirus disease. Scientists are basing a lot of information on what  they know about similar viruses, such as:  Viruses cannot generally survive on surfaces for long. They need a human body (host) to survive.  It is more likely that the virus is spread by close contact with people who are sick (direct contact), such as through: ? Shaking hands or hugging. ? Breathing in respiratory droplets that travel through the air. Droplets can be spread by a person breathing, speaking, singing, coughing, or sneezing.  It is less likely that the virus is spread when a person touches a surface or object that has the virus on it (indirect contact). The virus may be able to enter the body if the person touches a surface or object and then touches his or her face, eyes, nose, or mouth. Can a person spread the virus without having symptoms of the disease? It may be possible for the virus to spread before a person has symptoms of the disease, but this is most likely not the main way the virus is spreading. It is more likely for the virus to spread by being in close contact with people who are sick and breathing in the respiratory droplets spread by a person breathing, speaking, singing, coughing, or sneezing. What are the symptoms of coronavirus disease? Symptoms vary from person to person and can range from mild to severe. Symptoms may include:  Fever or chills.  Cough.  Difficulty breathing or feeling short of breath.  Headaches, body aches, or muscle aches.  Runny or stuffy (congested) nose.  Sore throat.  New loss of taste or smell.  Nausea, vomiting, or diarrhea. These symptoms can appear anywhere from 2 to 14 days after you have been exposed to the virus. Some people may not have any symptoms. If you develop symptoms, call your health care provider. People with severe symptoms may need hospital care. Should I be  tested for this virus? Your health care provider will decide whether to test you based on your symptoms, history of exposure, and your risk factors. How  does a health care provider test for this virus? Health care providers will collect samples to send for testing. Samples may include:  Taking a swab of fluid from the back of your nose and throat, your nose, or your throat.  Taking fluid from the lungs by having you cough up mucus (sputum) into a sterile cup.  Taking a blood sample. Is there a treatment or vaccine for this virus? Currently, there is no vaccine to prevent coronavirus disease. Also, there are no medicines like antibiotics or antivirals to treat the virus. A person who becomes sick is given supportive care, which means rest and fluids. A person may also relieve his or her symptoms by using over-the-counter medicines that treat sneezing, coughing, and runny nose. These are the same medicines that a person takes for the common cold. If you develop symptoms, call your health care provider. People with severe symptoms may need hospital care. What can I do to protect myself and my family from this virus?     You can protect yourself and your family by taking the same actions that you would take to prevent the spread of other viruses. Take the following actions:  Wash your hands often with soap and water for at least 20 seconds. If soap and water are not available, use alcohol-based hand sanitizer.  Avoid touching your face, mouth, nose, or eyes.  Cough or sneeze into a tissue, sleeve, or elbow. Do not cough or sneeze into your hand or the air. ? If you cough or sneeze into a tissue, throw it away immediately and wash your hands.  Disinfect objects and surfaces that you frequently touch every day.  Stay away from people who are sick.  Avoid going out in public, follow guidance from your state and local health authorities.  Avoid crowded indoor spaces. Stay at least 6 ft (2 m) away from others.  If you must go out in public, wear a cloth face covering or face mask. Make sure your mask covers your nose and mouth.  Stay home if  you are sick, except to get medical care. Call your health care provider before you get medical care. Your health care provider will tell you how long to stay home.  Make sure your vaccines are up to date. Ask your health care provider what vaccines you need. What should I do if I need to travel? Follow travel recommendations from your local health authority, the CDC, and WHO. Travel information and advice  Centers for Disease Control and Prevention (CDC): BodyEditor.hu  World Health Organization [facility]): ThirdIncome.ca Know the risks and take action to protect your health  You are at higher risk of getting coronavirus disease if you are traveling to areas with an outbreak or if you are exposed to travelers from areas with an outbreak.  Wash your hands often and practice good hygiene to lower the risk of catching or spreading the virus. What should I do if I am sick? General instructions to stop the spread of infection  Wash your hands often with soap and water for at least 20 seconds. If soap and water are not available, use alcohol-based hand sanitizer.  Cough or sneeze into a tissue, sleeve, or elbow. Do not cough or sneeze into your hand or the air.  If you cough or sneeze into a tissue, throw  it away immediately and wash your hands.  Stay home unless you must get medical care. Call your health care provider or local health authority before you get medical care.  Avoid public areas. Do not take public transportation, if possible.  If you can, wear a mask if you must go out of the house or if you are in close contact with someone who is not sick. Make sure your mask covers your nose and mouth. Keep your home clean  Disinfect objects and surfaces that are frequently touched every day. This may include: ? Counters and tables. ? Doorknobs and light switches. ? Sinks and  faucets. ? Electronics such as phones, remote controls, keyboards, computers, and tablets.  Wash dishes in hot, soapy water or use a dishwasher. Air-dry your dishes.  Wash laundry in hot water. Prevent infecting other household members  Let healthy household members care for children and pets, if possible. If you have to care for children or pets, wash your hands often and wear a mask.  Sleep in a different bedroom or bed, if possible.  Do not share personal items, such as razors, toothbrushes, deodorant, combs, brushes, towels, and washcloths. Where to find more information Centers for Disease Control and Prevention (CDC)  Information and news updates: https://www.butler-gonzalez.com/ World Health Organization [facility])  Information and news updates: MissExecutive.com.ee  Coronavirus health topic: https://www.castaneda.info/  Questions and answers on COVID-19: OpportunityDebt.at  Global tracker: who.sprinklr.com American Academy of Pediatrics (AAP)  Information for families: www.healthychildren.org/English/health-issues/conditions/chest-lungs/Pages/2019-Novel-Coronavirus.aspx The coronavirus situation is changing rapidly. Check your local health authority website or the CDC and [facility] websites for updates and news. When should I contact a health care provider?  Contact your health care provider if you have symptoms of an infection, such as fever or cough, and you: ? Have been near anyone who is known to have coronavirus disease. ? Have come into contact with a person who is suspected to have coronavirus disease. ? Have traveled to an area where there is an outbreak of COVID-19. When should I get emergency medical care?  Get help right away by calling your local emergency services (911 in the U.S.) if you have: ? Trouble breathing. ? Pain or pressure in your chest. ? Confusion. ? Blue-tinged lips and  fingernails. ? Difficulty waking from sleep. ? Symptoms that get worse. Let the emergency medical personnel know if you think you have coronavirus disease. Summary  A new respiratory virus is spreading from person to person and causing COVID-19 (coronavirus disease).  The virus that causes COVID-19 appears to spread easily. It spreads from one person to another through droplets from breathing, speaking, singing, coughing, or sneezing.  Older adults and those with chronic diseases are at higher risk of disease. If you are at higher risk for complications, take extra precautions.  There is currently no vaccine to prevent coronavirus disease. There are no medicines, such as antibiotics or antivirals, to treat the virus.  You can protect yourself and your family by washing your hands often, avoiding touching your face, and covering your coughs and sneezes. This information is not intended to replace advice given to you by your health care provider. Make sure you discuss any questions you have with your health care provider. Document Revised: 08/05/2019 Document Reviewed: 02/01/2019 Elsevier Patient Education  [place].

## 2019-10-23 DIAGNOSIS — J9621 Acute and chronic respiratory failure with hypoxia: Secondary | ICD-10-CM

## 2019-10-23 LAB — GLUCOSE, CAPILLARY
Glucose-Capillary: 123 mg/dL — ABNORMAL HIGH (ref 70–99)
Glucose-Capillary: 373 mg/dL — ABNORMAL HIGH (ref 70–99)
Glucose-Capillary: 243 mg/dL — ABNORMAL HIGH (ref 70–99)
Glucose-Capillary: 316 mg/dL — ABNORMAL HIGH (ref 70–99)

## 2019-10-23 MED ORDER — INSULIN GLARGINE 100 UNIT/ML ~~LOC~~ SOLN
30.0000 [IU] | Freq: Every day | SUBCUTANEOUS | Status: DC
  Administered 2019-10-24: 11:00:00 30 [IU] via SUBCUTANEOUS
  Filled 2019-10-23 (×2): qty 0.3

## 2019-10-23 NOTE — Progress Notes (Signed)
Progress Note    Robert Cuevas  [id number] DOB: Jul 12, 1930  DOA: 10/17/2019 [facility]    Brief Narrative:   Chief complaint: Follow-up hypoxia/shortness of breath  Medical records reviewed and are as summarized below:  Robert Cuevas is an 86 y.o. male with a PMH of hypertension, hyperlipidemia, PAF on chronic Eliquis, complete heart block status post pacemaker, type 2 diabetes who was admitted from his SNF with hypoxia in the setting of recently being diagnosed with COVID-19.  He was previously hospitalized 09/25/2019-10/08/2019 at which time he received a treatment course of IV remdesivir and Decadron, was subsequently readmitted 10/11/2019-10/16/2019 with severe sepsis (POA at that time) and hypoxia secondary to aspiration pneumonia/UTI.  Readmitted a third time 10/17/19 with ongoing hypoxia requiring increasing oxygen.  CT angiogram showed nonobstructive segmental pulmonary emboli in the right lower lobe and severe multilobar pneumonia with moderate right and small left pleural effusions.  Assessment/Plan:   Principal Problem:   Acute on chronic respiratory failure with hypoxia (HCC) secondary to multilobar pneumonia in the setting of recent COVID-19 associated pneumonia and aspiration complicated by pulmonary emboli The patient presented with pneumonia but not sepsis on admission.  At this time, primary driver of the patient's pneumonia felt to be secondary bacterial pneumonia in the setting of aspiration and he is therefore being covered for healthcare associated pathogens including Pseudomonas (had prior pseudomonal UTI).   Treated with cefepime x 7 days. He also continues on steroids and supplemental oxygen. Status post speech therapy evaluation with dysphagia 1 diet recommended.  Given the patient's recurrent readmissions and persistent hypoxia, a palliative care consultation was performed 10/19/2019 at this time, the patient and family continue to want  the full scope of treatment with the hope of discharging home.   Blood cultures negative.   Patient's oxygenation dropped to 70s and 60s when he is trying. It is unlikely that the patient's oxygenation will be weaned enough to 4 L for him to be safely discharged home.  Incentive spirometry.  Goals of care discussion. Patient with 3 admissions this month after having COVID-19 infection. Chest x-ray on admission shows evidence of significant bilateral pneumonitis.  I do not think that the patient's respiratory distress was improving enough for him to recover from this condition as the patient will continue to suffer from aspiration given his fraility. Wife and family wants to take the patient home.  As per wife she tells me that her kids Ladonna Snide) advised in 5 days they are fine home and advised in 5 years but he would like patient to go home at all cost. Explained and introduced the concept of hospice Home hospice organization who is doing poorly after suffering from severe COVID-19 pneumonia. Wife will discuss with children and will let us know tomorrow.  Active Problems:   Bilateral pulmonary emboli Continue Eliquis.  Would not increase dose given that these emboli are small.    Orthostatic hypotension/Elevated BUN Thought to be secondary to treatment with Lasix which has been discontinued.    Enterococcus/Pseudomonas UTI Remains on cefepime which should cover.    Hypokalemia Potassium low again this morning, will increase routine supplementation to 20 mEq of potassium chloride twice daily.  Should resolve with discontinuation of Lasix.    Bilateral pleural effusions Underwent right-sided thoracentesis 10/17/2019 with 700 cc of fluid removed.  Left-sided pleural effusion is small.  Pleural fluid cultures were negative.     Dysphagia Evaluated by speech therapy, continue dysphagia 1 diet with  nectar thickened liquids.    Paroxysmal atrial fibrillation (HCC)/Long term (current) use of  anticoagulants/status post pacemaker Continue metoprolol.  2D echo shows EF 45-50% with regional wall motion abnormalities.    COVID-19 virus infection No longer felt to be shedding virus and therefore is off isolation.    Diabetes mellitus type II, non insulin dependent ([place]), uncontrolled Hemoglobin A1c 8.4% on 09/25/2019.  Metformin on hold.  Had markedly elevated blood glucoses yesterday requiring several as needed doses of NovoLog to get his sugars down.  Currently being managed with Lantus 20 units daily, resistant scale SSI and 8 units of meal coverage.  CBGs 50-231.  Glycemic control should improve with steroid taper.    Pressure ulcer, stage II to sacrum, present prior to admission Continue wound care per nursing.    Hyperlipidemia Continue Lipitor.    Severe protein calorie malnutrition/underweight  Body mass index is 19.17 kg/m.  Dietitian consultation requested and performed.  Noted to have severe fat depletion and severe muscle depletion consistent with severe malnutrition.  Continue supplements as ordered.    Family Communication/Anticipated D/C date and plan/Code Status   DVT prophylaxis: On chronic Eliquis. Code Status: DNR.  Family Communication: Updated spouse by bedside. Disposition Plan: Family will inform tomorrow  regarding home health discharge versus home hospice discharge.   Medical Consultants:    Critical care  Palliative care   Anti-Infectives:    Cefepime 10/17/2019--->   Subjective:   Denies any acute complaint.  Appears short of breath.  No nausea no vomiting.  Has cough. Minimal oral intake.  Objective:    Vitals:   10/22/19 2054 10/22/19 2334 10/23/19 0505 10/23/19 1339  BP: (!) 94/52 (!) 108/50 (!) 94/44 110/72  Pulse: 73 72 70 70  Resp: 18  18 20   Temp: 98.1 F (36.7 C)  97.9 F (36.6 C) 97.6 F (36.4 C)  TempSrc:    Oral  SpO2: 91%  96% 93%  Weight:      Height:        Intake/Output Summary (Last 24 hours) at  10/23/2019 1745 Last data filed at 10/23/2019 0917 Gross per 24 hour  Intake 360 ml  Output 470 ml  Net -110 ml   Filed Weights   10/17/19 0304 10/17/19 1800  Weight: 62.1 kg 57.2 kg    Exam: General: Frail appearing. Cardiovascular: Heart sounds show a regular rate, and rhythm. No gallops or rubs. No murmurs. No JVD. Lungs: Nourished and becomes tachypneic with even minimal activity. Abdomen: Soft, nontender, nondistended with normal active bowel sounds. No masses. No hepatosplenomegaly. Skin: Warm and dry.  Stage II sacral ulcer. Extremities: No clubbing or cyanosis. No edema. Pedal pulses 2+.  Pressure Injury 10/17/19 Sacrum Medial Stage 2 -  Partial thickness loss of dermis presenting as a shallow open injury with a red, pink wound bed without slough. (Active)  10/17/19 1742  Location: Sacrum  Location Orientation: Medial  Staging: Stage 2 -  Partial thickness loss of dermis presenting as a shallow open injury with a red, pink wound bed without slough.  Wound Description (Comments):   Present on Admission: Yes    Data Reviewed:   I have personally reviewed following labs and imaging studies:  Labs: Labs show the following:   Basic Metabolic Panel: Recent Labs  Lab 10/17/19 0822 10/18/19 0147 10/19/19 0126 10/20/19 0526 10/21/19 0432 10/22/19 0432  NA  --  139 139 138 144 142  K  --  3.6 2.9* 3.5 4.4 3.2*  CL  --  100 97* 96* 101 101  CO2  --  25 30 31  35* 31  GLUCOSE  --  209* 233* 289* 198* 146*  BUN  --  10 17 35* 42* 37*  CREATININE  --  0.49* 0.47* 0.53* 0.65 0.42*  CALCIUM  --  8.7* 8.8* 8.7* 9.0 8.4*  MG 1.6* 1.8 1.8  --   --   --   PHOS 3.4  --   --   --   --   --    GFR Estimated Creatinine Clearance: 52.6 mL/min (A) (by C-G formula based on SCr of 0.42 mg/dL (L)).  CBC: Recent Labs  Lab 10/17/19 0421 10/18/19 0147  WBC 7.1 6.7  NEUTROABS 6.3  --   HGB 15.9 15.9  HCT 47.7 47.6  MCV 95.4 94.3  PLT 150 176   CBG: Recent Labs  Lab  10/22/19 1625 10/22/19 2051 10/23/19 0804 10/23/19 1144 10/23/19 1711  GLUCAP 116* 311* 123* 373* 243*    Microbiology Recent Results (from the past 240 hour(s))  Body fluid culture (includes gram stain)     Status: None   Collection Time: 10/17/19  2:46 PM   Specimen: Pleural Fluid  Result Value Ref Range Status   Specimen Description   Final    PLEURAL Performed at [facility] [address] [place], [place] [address]    Special Requests   Final    NONE Performed at [facility], [place] [address] [place], [place] [address]    Gram Stain NO WBC SEEN NO ORGANISMS SEEN   Final   Culture   Final    NO GROWTH 3 DAYS Performed at [facility], [place] [address], [place], [place] [address]    Report Status 10/21/2019 FINAL  Final    Procedures and diagnostic studies:  No results found.  Medications:   . apixaban  5 mg Oral BID  . atorvastatin  20 mg Oral Daily  . chlorhexidine  15 mL Mouth Rinse BID  . Chlorhexidine Gluconate Cloth  6 each Topical Daily  . cholecalciferol  1,000 Units Oral Daily  . collagenase  1 application Topical Daily  . insulin aspart  0-20 Units Subcutaneous TID WC  . insulin aspart  0-5 Units Subcutaneous QHS  . insulin aspart  10 Units Subcutaneous TID WC  . [START ON 10/24/2019] insulin glargine  30 Units Subcutaneous Daily  . mouth rinse  15 mL Mouth Rinse q12n4p  . metoprolol tartrate  50 mg Oral BID  . multivitamin with minerals  1 tablet Oral Daily  . omega-3 acid ethyl esters  1 g Oral BID  . potassium chloride  20 mEq Oral BID  . predniSONE  40 mg Oral QAC breakfast   Continuous Infusions:    LOS: 6 days     Berle [place]  [facility]  [facility] How to contact the [facility] Attending or Consulting provider [place] or covering provider during after hours [place], for this patient?  1. Check the care team in [facility] and look for a) attending/consulting TRH provider listed and b)  the [facility] team listed 2. Log into [email or web address] and use [place]'s universal password to access. If you do not have the password, please contact the hospital operator. 3. Locate the [facility] provider you are looking for under [facility] and page to a number that you can be directly reached. 4. If you still have difficulty reaching the provider, please page the [facility] (  Director on Call) for the Hospitalists listed on amion for assistance.  10/23/2019, 5:45 PM

## 2019-10-23 NOTE — Progress Notes (Signed)
NAME:  Robert Cuevas, MRN:  [id number], DOB:  04/28/1930, LOS: 6 ADMISSION DATE:  10/17/2019, CONSULTATION DATE:  12/28 REFERRING MD:  Lavina Hamman, MD, CHIEF COMPLAINT:  Hypoxemic respiratory failure  Brief History   86 year old man with history of afib on AC, HTN, HLD, pacemaker-dependence presenting with recurrent hypoxemic respiratory failure after having COVID in early December.  Tested positive on December 2.  History as below, confirmed with patient:  " Was admitted to [facility] on 09/25/2019 -10/08/2019, treated withIV Remdisivir and Decadron,discharged on 1 L nasal cannula toSNFand again on 10/11/2019-12/27/2020withsevere sepsis and hypoxia from aspiration pneumonia, also with UTI.Robert Cuevas was discharged with 2 L nasal cannula and instructions to f/u speech therapy-his nurse at [facility] reports he was satting 84% on 2 L nasal cannula. Increased him to 5 L and his sats wereonlly88%. Instructed by nurse practitioner to call [facility] sent here.Patient denies feeling short of breath. He has no complaints at this time. No chest pain. States he was sent here because of low oxygen levels.'  Denies chest pain, productive cough, fevers, chills, orthopnea. Seen by speech last admission rec'd for puree with thin liquids.  Consults:  PCCM  Procedures:  12/28 thoracentesis right chest therapeutic 700cc  Micro Data:  12/28Pleural fluid culture no WBC, no organisms 12/22 Urine cx PSA and E.Faecalis - pansensitive Antimicrobials:  Cefepime 12/28 >>   Interim history/subjective:   Denies any changes in his breathing Denies any discomfort Not feeling more short of breath  Objective   Blood pressure (!) 94/44, pulse 70, temperature 97.9 F (36.6 C), resp. rate 18, height 5\' 8"  (1.727 m), weight 57.2 kg, SpO2 96 %.        Intake/Output Summary (Last 24 hours) at 10/23/2019 1153 Last data filed at 10/23/2019 [id number] Gross per 24 hour  Intake 480  ml  Output 470 ml  Net 10 ml   Filed Weights   10/17/19 0304 10/17/19 1800  Weight: 62.1 kg 57.2 kg    Examination: General: Chronically ill-appearing HENT: Dry oral mucosa Lungs: Decreased air movement  cardiovascular: S1-S2 appreciated Abdomen: Soft, bowel sounds appreciated Extremities: No clubbing, no edema Neuro: Awake and alert, follows commands MSK: sacral ulcer Lines: PIV  CT scan of the chest from 12/28 reviewed showing multifocal infiltrates on the right with moderate effusion on the right   Assessment & Plan:  86 yo gentleman with recent hospitalization for COVID pneumonia now with recurrent respiratory failure:   Acute hypoxemic respiratory failure-requiring oxygen supplementation COVID-19 pneumonia-no longer requiring contact isolation Severe protein calorie malnutrition Atrial fibrillation Steroid induced hypoglycemia Heart failure with reduced ejection fraction  Continue oxygen supplementation Complete antibiotics Cautious diuresis  Appears to be stable  We will see as needed Call as needed  He still wants to go home however, there remains concern with his spouse will be able to take care of him at home   Best practice:  Diet: Dysphagia 2 diet with nectar thick liquids per MBS on 12/28 Pain/Anxiety/Delirium protocol (if indicated): minimize sedating medications VAP protocol (if indicated): n/a DVT prophylaxis: therapeutic AC GI prophylaxis: n/a Glucose control: steroid induced hyperglycemia, increase SSI as needed Foley none - condom cath Mobility: OOB with PT/OT Code Status: DNR Family Communication: patient updated at bedside Disposition: Patient is stable for RNF.   Labs/Imaging  CT Chest 12/28 with dense central peribronchovascular consolidations which spare the periphery, air bronchograms. Large right sided pleural effusion. Left lung relatively spared.   CBC: Recent Labs  Lab 10/17/19 0421 10/18/19 0147  WBC 7.1 6.7  NEUTROABS  6.3  --   HGB 15.9 15.9  HCT 47.7 47.6  MCV 95.4 94.3  PLT 150 0000000    Basic Metabolic Panel: Recent Labs  Lab 10/17/19 0822 10/18/19 0147 10/19/19 0126 10/20/19 0526 10/21/19 0432 10/22/19 0432  NA  --  139 139 138 144 142  K  --  3.6 2.9* 3.5 4.4 3.2*  CL  --  100 97* 96* 101 101  CO2  --  25 30 31  35* 31  GLUCOSE  --  209* 233* 289* 198* 146*  BUN  --  10 17 35* 42* 37*  CREATININE  --  0.49* 0.47* 0.53* 0.65 0.42*  CALCIUM  --  8.7* 8.8* 8.7* 9.0 8.4*  MG 1.6* 1.8 1.8  --   --   --   PHOS 3.4  --   --   --   --   --    GFR: Estimated Creatinine Clearance: 52.6 mL/min (A) (by C-G formula based on SCr of 0.42 mg/dL (L)). Recent Labs  Lab 10/17/19 0421 10/18/19 0147  PROCALCITON  --  <0.10  WBC 7.1 6.7    Liver Function Tests: No results for input(s): AST, ALT, ALKPHOS, BILITOT, PROT, ALBUMIN in the last 168 hours. No results for input(s): LIPASE, AMYLASE in the last 168 hours. No results for input(s): AMMONIA in the last 168 hours.  ABG    Component Value Date/Time   PHART 7.435 10/17/2019 0826   PCO2ART 34.7 10/17/2019 0826   PO2ART 75.6 (L) 10/17/2019 0826   HCO3 22.9 10/17/2019 0826   ACIDBASEDEF 0.2 10/17/2019 0826   O2SAT 94.7 10/17/2019 0826     Coagulation Profile: No results for input(s): INR, PROTIME in the last 168 hours.  Cardiac Enzymes: No results for input(s): CKTOTAL, CKMB, CKMBINDEX, TROPONINI in the last 168 hours.  HbA1C: Hgb A1c MFr Bld  Date/Time Value Ref Range Status  09/25/2019 04:52 AM 8.4 (H) 4.8 - 5.6 % Final    Comment:    (NOTE) Pre diabetes:          5.7%-6.4% Diabetes:              >6.4% Glycemic control for   <7.0% adults with diabetes     CBG: Recent Labs  Lab 10/22/19 1135 10/22/19 1625 10/22/19 2051 10/23/19 0804 10/23/19 1144  GLUCAP 109* 116* 311* 123* 373*   Sherrilyn Rist, MD [place], PCCM Cell: [phone]

## 2019-10-24 LAB — COMPREHENSIVE METABOLIC PANEL
ALT: 163 U/L — ABNORMAL HIGH (ref 0–44)
AST: 92 U/L — ABNORMAL HIGH (ref 15–41)
Albumin: 2.8 g/dL — ABNORMAL LOW (ref 3.5–5.0)
Alkaline Phosphatase: 249 U/L — ABNORMAL HIGH (ref 38–126)
Anion gap: 8 (ref 5–15)
BUN: 34 mg/dL — ABNORMAL HIGH (ref 8–23)
CO2: 34 mmol/L — ABNORMAL HIGH (ref 22–32)
Calcium: 9.3 mg/dL (ref 8.9–10.3)
Chloride: 105 mmol/L (ref 98–111)
Creatinine, Ser: 0.44 mg/dL — ABNORMAL LOW (ref 0.61–1.24)
GFR calc Af Amer: >60 mL/min (ref >60)
GFR calc non Af Amer: >60 mL/min (ref >60)
Glucose, Bld: 130 mg/dL — ABNORMAL HIGH (ref 70–99)
Potassium: 4.5 mmol/L (ref 3.5–5.1)
Sodium: 147 mmol/L — ABNORMAL HIGH (ref 135–145)
Total Bilirubin: 2.1 mg/dL — ABNORMAL HIGH (ref 0.3–1.2)
Total Protein: 6 g/dL — ABNORMAL LOW (ref 6.5–8.1)

## 2019-10-24 LAB — MAGNESIUM: Magnesium: 1.9 mg/dL (ref 1.7–2.4)

## 2019-10-24 LAB — CBC WITH DIFFERENTIAL/PLATELET
Abs Immature Granulocytes: 0.04 10*3/uL (ref 0.00–0.07)
Basophils Absolute: 0 10*3/uL (ref 0.0–0.1)
Basophils Relative: 0 %
Eosinophils Absolute: 0 10*3/uL (ref 0.0–0.5)
Eosinophils Relative: 0 %
HCT: 52.6 % — ABNORMAL HIGH (ref 39.0–52.0)
Hemoglobin: 16.8 g/dL (ref 13.0–17.0)
Immature Granulocytes: 0 %
Lymphocytes Relative: 4 %
Lymphs Abs: 0.5 10*3/uL — ABNORMAL LOW (ref 0.7–4.0)
MCH: 31.8 pg (ref 26.0–34.0)
MCHC: 31.9 g/dL (ref 30.0–36.0)
MCV: 99.4 fL (ref 80.0–100.0)
Monocytes Absolute: 0.9 10*3/uL (ref 0.1–1.0)
Monocytes Relative: 9 %
Neutro Abs: 8.8 10*3/uL — ABNORMAL HIGH (ref 1.7–7.7)
Neutrophils Relative %: 87 %
Platelets: 174 10*3/uL (ref 150–400)
RBC: 5.29 MIL/uL (ref 4.22–5.81)
RDW: 15.7 % — ABNORMAL HIGH (ref 11.5–15.5)
WBC: 10.3 10*3/uL (ref 4.0–10.5)
nRBC: 0 % (ref 0.0–0.2)

## 2019-10-24 LAB — LACTIC ACID, PLASMA: Lactic Acid, Venous: 1.8 mmol/L (ref 0.5–1.9)

## 2019-10-24 LAB — GLUCOSE, CAPILLARY
Glucose-Capillary: 107 mg/dL — ABNORMAL HIGH (ref 70–99)
Glucose-Capillary: 247 mg/dL — ABNORMAL HIGH (ref 70–99)
Glucose-Capillary: 265 mg/dL — ABNORMAL HIGH (ref 70–99)
Glucose-Capillary: 188 mg/dL — ABNORMAL HIGH (ref 70–99)
Glucose-Capillary: 258 mg/dL — ABNORMAL HIGH (ref 70–99)

## 2019-10-24 NOTE — Progress Notes (Signed)
Inpatient Diabetes Program Recommendations  AACE/ADA: New Consensus Statement on Inpatient Glycemic Control (2015)  Target Ranges:  Prepandial:   less than 140 mg/dL      Peak postprandial:   less than 180 mg/dL (1-2 hours)      Critically ill patients:  140 - 180 mg/dL   Results for Robert Cuevas, Robert Cuevas (MRN [id number]) as of 10/24/2019 10:10  Ref. Range 10/23/2019 08:04 10/23/2019 11:44 10/23/2019 17:11 10/23/2019 21:58  Glucose-Capillary Latest Ref Range: 70 - 99 mg/dL 123 (H)  3 units NOVOLOG  373 (H)  20 units NOVOLOG +  25 units LANTUS  243 (H)  7 units NOVOLOG  316 (H)  4 units NOVOLOG    Results for Robert Cuevas, Robert Cuevas (MRN [id number]) as of 10/24/2019 10:10  Ref. Range 10/24/2019 07:47  Glucose-Capillary Latest Ref Range: 70 - 99 mg/dL 107 (H)     Home DM Meds: Metformin 500 mg BID   Current Orders: Lantus 30 units Daily      Novolog Resistant Correction Scale/ SSI (0-20 units) TID AC + HS      Novolog 10 units TID with meals      MD- Please note that patient has NOT been consistently getting the scheduled Novolog 10 units TID with meals for Meal Coverage because patient has Not been eating at least 50% of meals.  Note Lantus dose increased to 30 units Daily today--CBG down to 107 mg/dl this AM and note that Prednisone has been stopped--Last dose Prednisone given this AM.  Please consider the following:  1. Reduce the Lantus back to 20 units Daily  2. Stop the Novolog 10 units TID with meals for now  3. Increase the frequency of the Novolog SSI to Q4 hours while PO intake poor     --Will follow patient during hospitalization--  Wyn Quaker RN, MSN, CDE Diabetes Coordinator Inpatient Glycemic Control Team Team Pager: [phone] (8a-5p)

## 2019-10-24 NOTE — Progress Notes (Signed)
[facility] [place]  [facility]  Pt wife Bertram Millard has agreed to hospice services and the comfort care approach at home. She will need equipment place din home before pt can discharge. I have ordered oxygen, hospital bed, Over bed table. Suction machine and nebulizer machine. This will be delivered this evening at some point by [employer]. The pt's wife is at home and awaiting equipment delivery. Pt should be ready for d/c as early as tomorrow from our standpoint if MD is in agreement.   Please note the address on the face sheet is not the address the pt will go home to:  New address is [address], [place] [address]  Cheri Kennedy RN [phone]

## 2019-10-24 NOTE — Care Management Important Message (Signed)
Important Message  Patient Details IM Letter given to Nancy Marus RN Case Manager to present to the Patient Name: Robert Cuevas MRN: [id number] Date of Birth: 08-Jan-1930   Medicare Important Message Given:  Yes     Kerin Salen 10/24/2019, 12:46 PM

## 2019-10-24 NOTE — TOC Progression Note (Signed)
Transition of Care [facility]) - Progression Note    Patient Details  Name: [name] MRN: [id number] Date of Birth: Jun 28, 1930  Transition of Care [facility]) CM/SW Contact  Joaquin Courts, RN Phone Number: 10/24/2019, 12:05 PM  Clinical Narrative:    [facility] consult for home with hospice received. CM spoke with patient's spouse Ruby who reports they are From [place] area. Referral given to [facility] [place] rep, [facility] (they have merged with [facility] [place]).  Hospice rep to speak with spouse and arrange delivery of necessary equipment. CM will await confirmation of equipment delivery and coordinate dc home accordingly.       Expected Discharge Plan: Home w Hospice Care Barriers to Discharge: Equipment Delay  Expected Discharge Plan and Services Expected Discharge Plan: [name]   Discharge Planning Services: CM Consult     Expected Discharge Date: 10/22/19               DME Arranged: Oxygen, Walker rolling DME Agency: [employer] Date DME Agency Contacted: 10/22/19 Time DME Agency Contacted: [phone] Representative spoke with at DME Agency: [place] ([place]) Interventions    Readmission Risk Interventions Readmission Risk Prevention Plan 10/02/2019  Medication Review (RN CM) Complete  Some recent data might be hidden

## 2019-10-24 NOTE — Progress Notes (Signed)
Physical Therapy Treatment Patient Details Name: Robert Cuevas MRN: [id number] DOB: 04/17/1930 Today's Date: 10/24/2019    History of Present Illness 86 yo male admitted with acute on chronic respiratory failure, PE, severe Pna. Initially diagnosed with COVID 09/21/19. Admitted to [facility] then d/c'd to SNF. Multiple readmissions to hospital since. Hx of pacemaker, DM, A fib, HB    PT Comments    Pt in bed on 2 lts. General Comments: AxO x 3 following all directions.  Lunch tray barely touched.  Pt stated "I'll eat more when I go home tomorrow".  Assisted OOB to recliner was a challenge.   General bed mobility comments: pt required MAX Assist due to weakness and rigidty.  Utilized bed pad to complete pivot and scoot to EOB.  Omce EOB required Max Assist for support as he presented posterior lean and inabliity to self correct to midline.  Pt present with Max c/o dizziness and immediate request to "lay back down".  Therapist sat along side of pt x 3 min as symptoms decreased while giving Mod Assist support to prevent LOB.  General transfer comment: pt was unable to attempt self rise due to weakness.  Therapist assisted using "Bear hug" stand pivot sit 1/4 turn to recliner.  B knees blocked by therapist as pt was unable to support his own weight.  Pt present with rigidty and gernalized wealness.  Required + 2 assist to scoot to back of recliner.  Required 6 pillows to position as upright as possible and decrease high risk pressure areas.  General Gait Details: unable to attempt due to low transfer ability.  Per chart review, pt amb 5 feet last week but required + 2 assist.   Follow Up Recommendations  Home health PT;Supervision/Assistance - 24 hour (family plans to take pt home.  Will need PTAR transport.)     Equipment Recommendations  Rolling walker with 5" wheels;Other (comment)(oxygen)  ? hospiall bed  Recommendations for Other Services        Precautions / Restrictions Precautions Precautions:  Fall Precaution Comments: Nectar Thicken liquids Restrictions Weight Bearing Restrictions: No    Mobility  Bed Mobility Overal bed mobility: Needs Assistance Bed Mobility: Supine to Sit     Supine to sit: Max assist     General bed mobility comments: pt required MAX Assist due to weakness and rigidty.  Utilized bed pad to complete pivot and scoot to EOB.  Omce EOB required Max Assist for support as he presented posterior lean and inabliity to self correct to midline.  Pt present with Max c/o dizziness and immediate request to "lay back down".  Therapist sat along side of pt x 3 min as symptoms decreased while giving Mod Assist support to prevent LOB.  Transfers Overall transfer level: Needs assistance Equipment used: None Transfers: Stand Pivot Transfers   Stand pivot transfers: Max assist;Total assist       General transfer comment: pt was unable to attempt self rise due to weakness.  Therapist assisted using "Bear hug" stand pivot sit 1/4 turn to recliner.  B knees blocked by therapist as pt was unable to support his own weight.  Pt present with rigidty and gernalized wealness.  Required + 2 assist to scoot to back of recliner.  Required 6 pillows to position as upright as possible and decrease high risk pressure areas.  Ambulation/Gait             General Gait Details: unable to attempt due to low transfer ability.  Per  chart review, pt amb 5 feet last week but required + 2 assist.   Stairs             Wheelchair Mobility    Modified Rankin (Stroke Patients Only)       Balance                                            Cognition Arousal/Alertness: Awake/alert Behavior During Therapy: WFL for tasks assessed/performed Overall Cognitive Status: Within Functional Limits for tasks assessed                                 General Comments: AxO x 3 following all directions.  Lunch tray barely touched.  Pt stated "I'll eat more  when I go home tomorrow".      Exercises      General Comments        Pertinent Vitals/Pain Pain Assessment: Faces Pain Location: back/buttocks/general Pain Descriptors / Indicators: Discomfort;Grimacing    Home Living                      Prior Function            PT Goals (current goals can now be found in the care plan section) Progress towards PT goals: Progressing toward goals    Frequency    Min 3X/week      PT Plan Current plan remains appropriate    Co-evaluation              AM-PAC PT "6 Clicks" Mobility   Outcome Measure  Help needed turning from your back to your side while in a flat bed without using bedrails?: A Lot Help needed moving from lying on your back to sitting on the side of a flat bed without using bedrails?: A Lot Help needed moving to and from a bed to a chair (including a wheelchair)?: Total Help needed standing up from a chair using your arms (e.g., wheelchair or bedside chair)?: Total Help needed to walk in hospital room?: Total Help needed climbing 3-5 steps with a railing? : Total 6 Click Score: 8    End of Session Equipment Utilized During Treatment: Oxygen;Gait belt Activity Tolerance: Patient limited by fatigue Patient left: in chair;with call bell/phone within reach;with bed alarm set Nurse Communication: Mobility status PT Visit Diagnosis: Muscle weakness (generalized) (M62.81);Difficulty in walking, not elsewhere classified (R26.2)     Time: [id number] PT Time Calculation (min) (ACUTE ONLY): 24 min  Charges:  $Therapeutic Activity: 23-37 mins                     Rica Koyanagi  PTA Acute  Rehabilitation Services Pager      [phone] Office      [phone]

## 2019-10-24 NOTE — Progress Notes (Signed)
Progress Note    Robert Cuevas  [id number] DOB: September 16, 1930  DOA: 10/17/2019 PCP: [facility]    Brief Narrative:   Chief complaint: Follow-up hypoxia/shortness of breath  Medical records reviewed and are as summarized below:  Robert Cuevas is an 86 y.o. male with a PMH of hypertension, hyperlipidemia, PAF on chronic Eliquis, complete heart block status post pacemaker, type 2 diabetes who was admitted from his SNF with hypoxia in the setting of recently being diagnosed with COVID-19.  He was previously hospitalized 09/25/2019-10/08/2019 at which time he received a treatment course of IV remdesivir and Decadron, was subsequently readmitted 10/11/2019-10/16/2019 with severe sepsis (POA at that time) and hypoxia secondary to aspiration pneumonia/UTI.  Readmitted a third time 10/17/19 with ongoing hypoxia requiring increasing oxygen.  CT angiogram showed nonobstructive segmental pulmonary emboli in the right lower lobe and severe multilobar pneumonia with moderate right and small left pleural effusions.  Assessment/Plan:   Principal Problem:   Acute on chronic respiratory failure with hypoxia (HCC) secondary to multilobar pneumonia in the setting of recent COVID-19 associated pneumonia and aspiration complicated by pulmonary emboli The patient presented with pneumonia but not sepsis on admission.  At this time, primary driver of the patient's pneumonia felt to be secondary bacterial pneumonia in the setting of aspiration and he is therefore being covered for healthcare associated pathogens including Pseudomonas (had prior pseudomonal UTI).   Treated with cefepime x 7 days. He also continues on steroids and supplemental oxygen. Status post speech therapy evaluation with dysphagia 1 diet recommended.  Given the patient's recurrent readmissions and persistent hypoxia, a palliative care consultation was performed 10/19/2019 at this time, the patient and family continue to want  the full scope of treatment with the hope of discharging home.   Blood cultures negative.   Patient's oxygenation dropped to 70s and 60s when he is trying to ambulate. It is unlikely that the patient's oxygenation will be weaned enough to 4 L for him to be safely discharged home.  Incentive spirometry.  Goals of care discussion. Patient with 3 admissions this month after having COVID-19 infection. Chest x-ray on admission shows evidence of significant bilateral pneumonitis.  I do not think that the patient's respiratory distress was improving enough for him to recover from this condition as the patient will continue to suffer from aspiration given his fraility. Wife and family wants to take the patient home.  As per wife she tells me that her kids Ladonna Snide) advised in 5 days they are fine home and advised in 5 years but he would like patient to go home at all cost. Explained and introduced the concept of hospice Home hospice organization who is doing poorly after suffering from severe COVID-19 pneumonia. Update for 10/24/2019. Had another discussion with patient's wife this morning. After talking with the children yesterday wife has felt that the patient will benefit from being on home with hospice. Consulted case management for home hospice initiation. Paperwork for sign equipment to be delivered later tonight and patient to be discharged on 10/25/2019.  Active Problems:   Bilateral pulmonary emboli Continue Eliquis.  Would not increase dose given that these emboli are small.    Orthostatic hypotension/Elevated BUN Thought to be secondary to treatment with Lasix which has been discontinued.    Enterococcus/Pseudomonas UTI Treated with cefepime    Hypokalemia Prolonged QTC Will monitor clinically.  Goal is comfort    Bilateral pleural effusions Underwent right-sided thoracentesis 10/17/2019 with 700 cc of fluid  removed.  Left-sided pleural effusion is small.  Pleural fluid cultures were  negative.     Dysphagia Evaluated by speech therapy, continue dysphagia 1 diet with nectar thickened liquids.    Paroxysmal atrial fibrillation (HCC)/Long term (current) use of anticoagulants/status post pacemaker Continue metoprolol.  2D echo shows EF 45-50% with regional wall motion abnormalities.    COVID-19 virus infection No longer felt to be shedding virus and therefore is off isolation.    Diabetes mellitus type II, non insulin dependent ([place]), uncontrolled Hemoglobin A1c 8.4% on 09/25/2019.  Metformin on hold.  Had markedly elevated blood glucoses yesterday requiring several as needed doses of NovoLog to get his sugars down.  Currently being managed with Lantus 20 units daily, resistant scale SSI and 8 units of meal coverage.  CBGs 50-231.  Glycemic control should improve with steroid taper.    Pressure ulcer, stage II to sacrum, present prior to admission Continue wound care per nursing.    Hyperlipidemia Continue Lipitor.    Severe protein calorie malnutrition/underweight  Body mass index is 19.17 kg/m.  Dietitian consultation requested and performed.  Noted to have severe fat depletion and severe muscle depletion consistent with severe malnutrition.  Continue supplements as ordered.    Family Communication/Anticipated D/C date and plan/Code Status   DVT prophylaxis: On chronic Eliquis. Code Status: DNR.  Family Communication: Updated spouse by bedside. Disposition Plan: Home with hospice tomorrow on 10/25/2019 pending equipments delivery patient will require transport   Medical Consultants:    Critical care  Palliative care   Anti-Infectives:    Cefepime 10/17/2019--->   Subjective:   No acute events.  Continues to have shortness of breath continues to have cough.  Minimal oral intake.  Objective:    Vitals:   10/23/19 0505 10/23/19 1339 10/23/19 2000 10/24/19 1344  BP: (!) 94/44 110/72 (!) 113/47 (!) 88/63  Pulse: 70 70 70 69  Resp: 18 20 16 16    Temp: 97.9 F (36.6 C) 97.6 F (36.4 C) 97.6 F (36.4 C) (!) 97.5 F (36.4 C)  TempSrc:  Oral Oral Oral  SpO2: 96% 93% 97% 94%  Weight:      Height:        Intake/Output Summary (Last 24 hours) at 10/24/2019 1750 Last data filed at 10/24/2019 1318 Gross per 24 hour  Intake 580 ml  Output 1000 ml  Net -420 ml   Filed Weights   10/17/19 0304 10/17/19 1800  Weight: 62.1 kg 57.2 kg    Exam: General: Frail appearing. Cardiovascular: Heart sounds show a regular rate, and rhythm. No gallops or rubs. No murmurs. No JVD. Lungs: Nourished and becomes tachypneic with even minimal activity. Abdomen: Soft, nontender, nondistended with normal active bowel sounds. No masses. No hepatosplenomegaly. Skin: Warm and dry.  Stage II sacral ulcer. Extremities: No clubbing or cyanosis. No edema. Pedal pulses 2+.  Pressure Injury 10/17/19 Sacrum Medial Stage 2 -  Partial thickness loss of dermis presenting as a shallow open injury with a red, pink wound bed without slough. (Active)  10/17/19 1742  Location: Sacrum  Location Orientation: Medial  Staging: Stage 2 -  Partial thickness loss of dermis presenting as a shallow open injury with a red, pink wound bed without slough.  Wound Description (Comments):   Present on Admission: Yes    Data Reviewed:   I have personally reviewed following labs and imaging studies:  Labs: Labs show the following:   Basic Metabolic Panel: Recent Labs  Lab 10/18/19 0147 10/19/19 0126 10/20/19  [id number] 10/21/19 0432 10/22/19 0432 10/24/19 0512  NA 139 139 138 144 142 147*  K 3.6 2.9* 3.5 4.4 3.2* 4.5  CL 100 97* 96* 101 101 105  CO2 25 30 31  35* 31 34*  GLUCOSE 209* 233* 289* 198* 146* 130*  BUN 10 17 35* 42* 37* 34*  CREATININE 0.49* 0.47* 0.53* 0.65 0.42* 0.44*  CALCIUM 8.7* 8.8* 8.7* 9.0 8.4* 9.3  MG 1.8 1.8  --   --   --  1.9   GFR Estimated Creatinine Clearance: 52.6 mL/min (A) (by C-G formula based on SCr of 0.44 mg/dL (L)).  CBC: Recent Labs   Lab 10/18/19 0147 10/24/19 0512  WBC 6.7 10.3  NEUTROABS  --  8.8*  HGB 15.9 16.8  HCT 47.6 52.6*  MCV 94.3 99.4  PLT 176 174   CBG: Recent Labs  Lab 10/23/19 2158 10/24/19 0747 10/24/19 1214 10/24/19 1228 10/24/19 1647  GLUCAP 316* 107* 265* 247* 188*    Microbiology Recent Results (from the past 240 hour(s))  Body fluid culture (includes gram stain)     Status: None   Collection Time: 10/17/19  2:46 PM   Specimen: Pleural Fluid  Result Value Ref Range Status   Specimen Description   Final    PLEURAL Performed at [facility], [place] [address] [place], [place] [address]    Special Requests   Final    NONE Performed at [facility], [place] [address], [place], [place] [address]    Gram Stain NO WBC SEEN NO ORGANISMS SEEN   Final   Culture   Final    NO GROWTH 3 DAYS Performed at [facility], [place] [address], [place], [place] [address]    Report Status 10/21/2019 FINAL  Final    Procedures and diagnostic studies:  No results found.  Medications:   . apixaban  5 mg Oral BID  . atorvastatin  20 mg Oral Daily  . chlorhexidine  15 mL Mouth Rinse BID  . Chlorhexidine Gluconate Cloth  6 each Topical Daily  . cholecalciferol  1,000 Units Oral Daily  . collagenase  1 application Topical Daily  . insulin aspart  0-20 Units Subcutaneous TID WC  . insulin aspart  0-5 Units Subcutaneous QHS  . insulin aspart  10 Units Subcutaneous TID WC  . insulin glargine  30 Units Subcutaneous Daily  . mouth rinse  15 mL Mouth Rinse q12n4p  . metoprolol tartrate  50 mg Oral BID  . multivitamin with minerals  1 tablet Oral Daily  . omega-3 acid ethyl esters  1 g Oral BID  . potassium chloride  20 mEq Oral BID   Continuous Infusions:    LOS: 7 days     Berle Mull  [facility]  [facility] How to contact the [facility] Attending or Consulting provider [place] or covering provider during after hours [place], for this  patient?  1. Check the care team in [facility] and look for a) attending/consulting TRH provider listed and b) the [facility] team listed 2. Log into [email or web address] and use [place]'s universal password to access. If you do not have the password, please contact the hospital operator. 3. Locate the [facility] provider you are looking for under [facility] and page to a number that you can be directly reached. 4. If you still have difficulty reaching the provider, please page the [facility] (Director on Call) for the Hospitalists listed on amion for assistance.  10/24/2019, 5:50  PM

## 2019-10-24 NOTE — Progress Notes (Signed)
Occupational Therapy Treatment Patient Details Name: Robert Cuevas MRN: [id number] DOB: 10-Apr-1930 Today's Date: 10/24/2019    History of present illness 86 yo male admitted with acute on chronic respiratory failure, PE, severe Pna. Initially diagnosed with COVID 09/21/19. Admitted to [facility] then d/c'd to SNF. Multiple readmissions to hospital since. Hx of pacemaker, DM, A fib, HB   OT comments  Plan is now home tomorrow with Hospice   Follow Up Recommendations  Supervision/Assistance - 24 hour;Home health OT(with hospice per RN)    Equipment Recommendations  (tba further)    Recommendations for Other Services      Precautions / Restrictions Precautions Precautions: Fall Precaution Comments: Nectar Thicken liquids Restrictions Weight Bearing Restrictions: No       Mobility Bed Mobility Overal bed mobility: Needs Assistance Bed Mobility: Rolling Rolling: Min assist   Supine to sit: Max assist     General bed mobility comments: pt required MAX Assist due to weakness and rigidty.  Utilized bed pad to complete pivot and scoot to EOB.  Omce EOB required Max Assist for support as he presented posterior lean and inabliity to self correct to midline.  Pt present with Max c/o dizziness and immediate request to "lay back down".  Therapist sat along side of pt x 3 min as symptoms decreased while giving Mod Assist support to prevent LOB.  Transfers Overall transfer level: Needs assistance Equipment used: None Transfers: Stand Pivot Transfers   Stand pivot transfers: Max assist;Total assist       General transfer comment: pt was unable to attempt self rise due to weakness.  Therapist assisted using "Bear hug" stand pivot sit 1/4 turn to recliner.  B knees blocked by therapist as pt was unable to support his own weight.  Pt present with rigidty and gernalized wealness.  Required + 2 assist to scoot to back of recliner.  Required 6 pillows to position as upright as possible and decrease  high risk pressure areas.        ADL either performed or assessed with clinical judgement   ADL Overall ADL's : Needs assistance/impaired     Grooming: Wash/dry face;Wash/dry hands;Bed level Grooming Details (indicate cue type and reason): HOB raised                               General ADL Comments: pt with limited participation this day . did participate with grooming.  During OT session RN shared plan is not home wiht Hospice.     Vision Baseline Vision/History: Wears glasses Wears Glasses: Reading only            Cognition Arousal/Alertness: Awake/alert Behavior During Therapy: WFL for tasks assessed/performed Overall Cognitive Status: Within Functional Limits for tasks assessed                                 General Comments: AxO x 3 following all directions.  Lunch tray barely touched.  Pt stated "I'll eat more when I go home tomorrow".          Pertinent Vitals/ Pain       Pain Assessment: Faces Faces Pain Scale: Hurts even more Pain Location: back/buttocks/general Pain Descriptors / Indicators: Discomfort;Grimacing         Frequency  Min 2X/week        Progress Toward Goals  OT Goals(current goals can now be found in  the care plan section)  Progress towards OT goals: OT to reassess next treatment     Plan Discharge plan needs to be updated       AM-PAC OT "6 Clicks" Daily Activity     Outcome Measure   Help from another person eating meals?: A Little Help from another person taking care of personal grooming?: A Little Help from another person toileting, which includes using toliet, bedpan, or urinal?: Total Help from another person bathing (including washing, rinsing, drying)?: A Lot Help from another person to put on and taking off regular upper body clothing?: A Little Help from another person to put on and taking off regular lower body clothing?: Total 6 Click Score: 13    End of Session    OT Visit  Diagnosis: Muscle weakness (generalized) (M62.81)   Activity Tolerance Patient limited by fatigue(BP)   Patient Left in bed;with call bell/phone within reach;with bed alarm set   Nurse Communication Mobility status        Time: [id number] OT Time Calculation (min): 18 min  Charges: OT General Charges $OT Visit: 1 Visit OT Treatments $Self Care/Home Management : 8-22 mins  Kari Baars, [place] [phone] Office- [phone]      Maleeah Crossman, Edwena Felty D 10/24/2019, 3:31 PM

## 2019-10-25 LAB — GLUCOSE, CAPILLARY
Glucose-Capillary: 206 mg/dL — ABNORMAL HIGH (ref 70–99)
Glucose-Capillary: 145 mg/dL — ABNORMAL HIGH (ref 70–99)
Glucose-Capillary: 65 mg/dL — ABNORMAL LOW (ref 70–99)
Glucose-Capillary: 77 mg/dL (ref 70–99)
Glucose-Capillary: 77 mg/dL (ref 70–99)
Glucose-Capillary: 212 mg/dL — ABNORMAL HIGH (ref 70–99)

## 2019-10-25 MED ORDER — METOPROLOL TARTRATE 50 MG PO TABS: 50.0000 mg | ORAL_TABLET | Freq: Two times a day (BID) | ORAL | 1 refills | Status: DC

## 2019-10-25 MED ORDER — METFORMIN HCL 1000 MG PO TABS: 1000.0000 mg | ORAL_TABLET | Freq: Two times a day (BID) | ORAL | 1 refills | Status: AC

## 2019-10-25 MED ORDER — GLIPIZIDE 5 MG PO TABS: 5.0000 mg | ORAL_TABLET | Freq: Two times a day (BID) | ORAL | 1 refills | Status: AC

## 2019-10-25 MED ORDER — APIXABAN 5 MG PO TABS: 5.0000 mg | ORAL_TABLET | Freq: Two times a day (BID) | ORAL | 6 refills | Status: AC

## 2019-10-25 MED ORDER — ALBUTEROL SULFATE HFA 108 (90 BASE) MCG/ACT IN AERS: 2.0000 | INHALATION_SPRAY | Freq: Four times a day (QID) | RESPIRATORY_TRACT | 1 refills | Status: DC | PRN

## 2019-10-25 MED ORDER — DICLOFENAC SODIUM 1 % EX GEL: 2.0000 g | Freq: Every day | CUTANEOUS | 1 refills | Status: DC | PRN

## 2019-10-25 NOTE — Plan of Care (Signed)
  Problem: Health Behavior/Discharge Planning: Goal: Ability to manage health-related needs will improve Outcome: Progressing   Problem: Clinical Measurements: Goal: Respiratory complications will improve Outcome: Progressing Goal: Cardiovascular complication will be avoided Outcome: Progressing   Problem: Activity: Goal: Risk for activity intolerance will decrease Outcome: Progressing   Problem: Elimination: Goal: Will not experience complications related to bowel motility Outcome: Progressing   Problem: Safety: Goal: Ability to remain free from injury will improve Outcome: Progressing   Problem: Skin Integrity: Goal: Risk for impaired skin integrity will decrease Outcome: Progressing

## 2019-10-25 NOTE — TOC Transition Note (Signed)
Transition of Care [facility]) - CM/SW Discharge Note   Patient Details  Name: Robert Cuevas MRN: [id number] Date of Birth: 1930-07-15  [facility] Care [facility]) CM/SW Contact:  Dessa Phi, RN Phone Number: 10/25/2019, 10:41 AM   Clinical Narrative:  For d/c by PTAR awaiting HTA to [employer] transport. Faxed all info-ref#[id number] T[place]ia R aware of expidited auth needed for [place] to make home visit by 1p.     Final next level of care: Home w Hospice Care Barriers to Discharge: Insurance Authorization   Patient Goals and CMS Choice        Discharge Placement                Patient to be transferred to facility by: [place] Name of family member notified: Ruby Patient and family notified of of transfer: 10/25/19  Discharge Plan and Services   Discharge Planning Services: CM Consult            DME Arranged: Oxygen, Walker rolling DME Agency: [employer] Date DME Agency Contacted: 10/22/19 Time DME Agency Contacted: [phone] Representative spoke with at DME Agency: [place] ([place]) Interventions     Readmission Risk Interventions Readmission Risk Prevention Plan 10/02/2019  Medication Review (RN CM) Complete  Some recent data might be hidden

## 2019-10-25 NOTE — Discharge Summary (Signed)
Physician Discharge Summary  Robert Cuevas KHT:[id number] DOB: 17-Apr-1930 DOA: 10/17/2019  [facility]P: [facility]  Admit date: 10/17/2019 Discharge date: 10/25/2019  Admitted From: SNF ([facility]) Disposition: Home with home hospice  Recommendations for Outpatient Follow-up:  1. Follow ups as below. 2. Please obtain CBC/BMP/Mag at follow up 3. Please follow up on the following pending results: none  Home Health: None Equipment/Devices: Per Hospice  Discharge Condition: Stable CODE STATUS: DNR/DNI  Follow-up Information    [facility]. Schedule an appointment as soon as possible for a visit in 1 week(s).   Why: Hospital follow up. Contact information: [place] [address] [phone]        [facility] Follow up.   Specialty: Home Health Services Why: agency will provide home health physical therapy. agency will call you to schedule first visit.  Contact information: [place] [address] [phone]           Hospital Course: 86 y.o. male with a PMH of hypertension, hyperlipidemia, PAF on chronic Eliquis, complete heart block status post pacemaker, type 2 diabetes who was admitted from his SNF with hypoxia in the setting of recently being diagnosed with COVID-19.  He was previously hospitalized 09/25/2019-10/08/2019 at which time he received a treatment course of IV remdesivir and Decadron, was subsequently readmitted 10/11/2019-10/16/2019 with severe sepsis (POA at that time) and hypoxia secondary to aspiration pneumonia/UTI.  Readmitted a third time 10/17/19 with ongoing hypoxia requiring increasing oxygen.  CT angiogram showed nonobstructive segmental pulmonary emboli in the right lower lobe and severe multilobar pneumonia with moderate right and small left pleural effusions.  Continued on Eliquis for anticoagulation.  Completed antibiotic course for pneumonia.  After discussion about  patient's condition including chronic comorbidities, debility and frequent admission concept of home hospice care introduced by previous provider.  Palliative care consulted and met with patient and family, and the decision was made to discharge patient with home hospice.  See individual problems below for more hospital course.  Discharge Diagnoses:  Acute on chronic respiratory failure: Multifactorial-including multifocal pneumonia in the setting of recent COVID-19 infection, aspiration and PE -Completed treatment course for COVID-19 -Completed treatment course for bacterial infection with cefepime for 7 days. -On Eliquis for pulmonary embolism. -Will discharge home on 4 L by nasal cannula.  Bilateral pulmonary embolism -Continue home Eliquis.  Enterococcus/Pseudomonas UTI -Completed treatment with 7 days of cefepime.  Bilateral pleural effusion: Underwent right-sided thoracocentesis on 12/28 with removal of 700cc.  Cultures negative.  Elevated liver enzymes: -Stop statin  Dysphagia: SLP recommended dysphagia 1 diet with nectar thickened liquids  Paroxysmal A. Fib -Continue metoprolol and Eliquis.  DM-2 -Discharged on Metformin and glipizide.  Systolic CHF: Echo with EF of 45 to 50% with regional wall motion abnormalities.  Appears euvolemic. -Continue medical follow-up -Not on diuretics-high risk for dehydration.  Severe protein calorie malnutrition: BMI 19.17. -Appreciate dietitian input -Continue supplements.  Stage II sacral decubitus: POA -Continue wound care per nursing -Frequent turning.  Discharge Instructions  Discharge Instructions    Call MD for:  difficulty breathing, headache or visual disturbances   Complete by: As directed    Call MD for:  extreme fatigue   Complete by: As directed    Call MD for:  persistant dizziness or light-headedness   Complete by: As directed    Call MD for:  persistant nausea and vomiting   Complete by: As directed    Call  MD for:  severe uncontrolled pain   Complete by: As directed    Call MD for:  temperature >100.4   Complete by: As directed    Diet - low sodium heart healthy   Complete by: As directed    Diet Carb Modified   Complete by: As directed    Diet general   Complete by: As directed    For home use only DME oxygen   Complete by: As directed    Length of Need: 6 Months   Liters per Minute: 4   Frequency: Continuous (stationary and portable oxygen unit needed)   Oxygen conserving device: Yes   Oxygen delivery system: Gas   Increase activity slowly   Complete by: As directed    Increase activity slowly   Complete by: As directed    Increase activity slowly   Complete by: As directed      Allergies as of 10/25/2019   No Known Allergies     Medication List    STOP taking these medications   atorvastatin 20 MG tablet Commonly known as: LIPITOR   predniSONE 20 MG tablet Commonly known as: DELTASONE   ramipril 2.5 MG capsule Commonly known as: ALTACE     TAKE these medications   acetaminophen 325 MG tablet Commonly known as: TYLENOL Take 2 tablets (650 mg total) by mouth every 6 (six) hours as needed for mild pain (or Fever >/= 101).   albuterol 108 (90 Base) MCG/ACT inhaler Commonly known as: VENTOLIN HFA Inhale 2 puffs into the lungs every 6 (six) hours as needed for wheezing or shortness of breath.   apixaban 5 MG Tabs tablet Commonly known as: ELIQUIS Take 1 tablet (5 mg total) by mouth 2 (two) times daily.   carboxymethylcellulose 0.5 % Soln Commonly known as: REFRESH PLUS Place 1 drop into both eyes daily as needed (dry eyes).   cholecalciferol 25 MCG (1000 UT) tablet Commonly known as: VITAMIN D3 Take 1,000 Units by mouth daily.   diclofenac Sodium 1 % Gel Commonly known as: VOLTAREN Apply 2 g topically daily as needed (knee pain). What changed: how much to take   Fish Oil 1200 MG Caps Take 2,400 mg by mouth daily.   glipiZIDE 5 MG tablet Commonly known  as: GLUCOTROL Take 1 tablet (5 mg total) by mouth 2 (two) times daily.   metFORMIN 1000 MG tablet Commonly known as: Glucophage Take 1 tablet (1,000 mg total) by mouth 2 (two) times daily with a meal. What changed:   medication strength  how much to take   metoprolol tartrate 50 MG tablet Commonly known as: LOPRESSOR Take 1 tablet (50 mg total) by mouth 2 (two) times daily.   Resource ThickenUp Clear Powd Thicken liquids to nectar consistency.   Santyl ointment Generic drug: collagenase Apply 1 application topically daily. Apply nickel thickness amount to sacrum wound daily            Durable Medical Equipment  (From admission, onward)         Start     Ordered   10/22/19 1100  For home use only DME Walker rolling  Once    Comments: 5" wheels  Question:  Patient needs a walker to treat with the following condition  Answer:  Physical deconditioning   10/22/19 1100   10/22/19 0000  For home use only DME oxygen    Question Answer Comment  Length of Need 6 Months   Liters per Minute 4   Frequency Continuous (stationary and portable  oxygen unit needed)   Oxygen conserving device Yes   Oxygen delivery system Gas      10/22/19 1050   10/21/19 1419  For home use only DME Walker rolling  Once    Comments: With 5 inch wheels  Question:  Patient needs a walker to treat with the following condition  Answer:  Physical deconditioning   10/21/19 1418   10/21/19 1419  For home use only DME oxygen  Once    Question Answer Comment  Length of Need 6 Months   Mode or (Route) Nasal cannula   Liters per Minute 4   Frequency Continuous (stationary and portable oxygen unit needed)   Oxygen conserving device Yes   Oxygen delivery system Gas      10/21/19 1418          Consultations:  Palliative care  Procedures/Studies:  2D Echo on  1. Left ventricular ejection fraction, by visual estimation, is 45 to 50%. The left ventricle has normal function. There is no left  ventricular hypertrophy.  2. The left ventricle demonstrates regional wall motion abnormalities.  3. Interventricular septal dyssynchrony and hypokinesis consistent with RV pacing.  4. Global right ventricle has normal systolic function.The right ventricular size is moderately enlarged. No increase in right ventricular wall thickness.  5. Left atrial size was normal.  6. Right atrial size was normal.  7. Mild mitral annular calcification.  8. The mitral valve is normal in structure. No evidence of mitral valve regurgitation. No evidence of mitral stenosis.  9. The tricuspid valve is normal in structure. 10. The aortic valve is normal in structure. Aortic valve regurgitation is mild. Mild aortic valve stenosis. 11. The pulmonic valve was normal in structure. Pulmonic valve regurgitation is not visualized. 12. Moderately elevated pulmonary artery systolic pressure. 13. A pacer wire is visualized. 14. The inferior vena cava is normal in size with <50% respiratory variability, suggesting right atrial pressure of 8 mmHg.   DG Chest 1 View  Result Date: 10/17/2019 CLINICAL DATA:  Decreased oxygen saturation EXAM: CHEST  1 VIEW COMPARISON:  Film from earlier in the same day. FINDINGS: Cardiac shadow is stable. Pacing device is again seen. Diffuse infiltrates are noted right greater than left stable from prior exam. No acute bony abnormality is seen. No pneumothorax is noted. IMPRESSION: Stable appearance of the chest when compared with the prior study. Electronically Signed   By: Inez Catalina M.D.   On: 10/17/2019 15:16   CT Head Wo Contrast  Result Date: 10/11/2019 CLINICAL DATA:  Altered mental status. COVID-19 positive with hypoxia EXAM: CT HEAD WITHOUT CONTRAST TECHNIQUE: Contiguous axial images were obtained from the base of the skull through the vertex without intravenous contrast. COMPARISON:  None. FINDINGS: Brain: There is mild diffuse atrophy. There is a cavum septum pellucidum, an  anatomic variant. There is no intracranial mass, hemorrhage, extra-axial fluid collection, or midline shift. There is patchy small vessel disease throughout the centra semiovale bilaterally. There is evidence of a prior infarct involving a portion of the anterior limb of the left external capsule. Scattered lacunar type infarcts are noted in the centra semiovale bilaterally. There is decreased attenuation at the gray-white junction the left mid frontal lobe, likely chronic. No acute appearing infarct is evident on this study. Vascular: There is no hyperdense vessel. There is calcification in each carotid siphon region. Skull: The bony calvarium appears intact. Sinuses/Orbits: There is mucosal thickening in several ethmoid air cells. There are air-fluid levels in the left and  right sphenoid sinus regions. Other paranasal sinuses are clear. Orbits appear symmetric bilaterally. Other: The mastoid air cells are clear. IMPRESSION: 1. Atrophy with extensive supratentorial small vessel disease. 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 Probable prior infarct at the gray-white junction of the mid left frontal lobe. No acute appearing infarct is evident. No mass or hemorrhage. 2.  There are foci of arterial vascular calcification. 3. Areas of paranasal sinus disease, most notably in the sphenoid sinus regions with air-fluid levels in the sphenoid sinus regions bilaterally. Electronically Signed   By: Lowella Grip III M.D.   On: 10/11/2019 10:27   CT ANGIO CHEST PE W OR WO CONTRAST  Result Date: 10/17/2019 CLINICAL DATA:  86 year old male with history of respiratory failure. COVID-19 infection with increasing oxygen requirements. EXAM: CT CHEST WITHOUT CONTRAST CT ANGIOGRAPHY CHEST WITH CONTRAST TECHNIQUE: Multidetector CT imaging of the chest was performed following the standard protocol without intravenous contrast. High resolution imaging of the lungs, as well as  inspiratory and expiratory imaging, was performed. Additional multidetector CT imaging of the chest was performed using the standard protocol during bolus administration of intravenous contrast. Multiplanar CT image reconstructions and MIPs were obtained to evaluate the vascular anatomy. COMPARISON:  Chest CTA 03/03/2013. FINDINGS: Cardiovascular: Although study is limited by considerable patient respiratory motion, there is a small nonocclusive central filling defect in a right lower lobe segmental sized branch (axial image 129 of series 7) of the pulmonary arteries, indicative of pulmonary embolism. No other larger more central or lobar filling defect is noted. Heart size is enlarged with biatrial dilatation (right greater than left). There is no significant pericardial fluid, thickening or pericardial calcification. There is aortic atherosclerosis, as well as atherosclerosis of the great vessels of the mediastinum and the coronary arteries, including calcified atherosclerotic plaque in the left main, left anterior descending, left circumflex and right coronary arteries. Thickening calcification of the aortic valve and calcifications of the mitral annulus. Right-sided pacemaker device in place with lead tips terminating in the right atrial appendage and right ventricle. Mediastinum/Nodes: No pathologically enlarged mediastinal or hilar lymph nodes. Moderate-sized hiatal hernia. No axillary lymphadenopathy. Lungs/Pleura: Patchy multifocal areas of ground-glass attenuation, septal thickening and frank airspace consolidation in the lungs bilaterally (right much greater than left). The densest areas of consolidation are in the right upper and right lower lobe where there is some subpleural sparing. Moderate right and small left pleural effusions lying dependently. Upper Abdomen: Aortic atherosclerosis. Musculoskeletal: There are no aggressive appearing lytic or blastic lesions noted in the visualized portions of the  skeleton. IMPRESSION: 1. Tiny nonobstructive segmental sized embolus in the right lower lobe pulmonary arteries, as above. 2. Severe multilobar bilateral pneumonia (right greater than left), compatible with reported COVID-19 infection. 3. Cardiomegaly with biatrial dilatation. 4. Moderate right and small left pleural effusions. 5. Aortic atherosclerosis, in addition to left main and 3 vessel coronary artery disease. 6. There are calcifications of the aortic valve and mitral annulus. Echocardiographic correlation for evaluation of potential valvular dysfunction may be warranted if clinically indicated. Aortic Atherosclerosis (ICD10-I70.0). Electronically Signed   By: Vinnie Langton M.D.   On: 10/17/2019 10:26   US Abdomen Complete  Result Date: 10/06/2019 CLINICAL DATA:  Elevated liver function tests.  Hyperbilirubinemia. EXAM: ABDOMEN ULTRASOUND COMPLETE COMPARISON:  CT chest 03/03/2013. FINDINGS: Gallbladder: No gallstones or wall thickening visualized. No sonographic Murphy sign noted by sonographer. Common bile duct: Diameter: 0.4 cm Liver: No focal lesion.  The liver appears heterogeneous with increased echogenicity. Portal vein is patent on color Doppler imaging with normal direction of blood flow towards the liver. IVC: No abnormality visualized. Pancreas: Visualized portion unremarkable. Spleen: Size and appearance within normal limits. Right Kidney: Length: 9.0. Echogenicity within normal limits. No mass or hydronephrosis visualized. Left Kidney: Length: 9.3 cm. Echogenicity within normal limits. No mass or hydronephrosis visualized. Abdominal aorta: No aneurysm visualized. Other findings: Small right pleural effusion is seen. IMPRESSION: Fatty infiltration of the liver. Negative for biliary dilatation. Small right pleural effusion. Electronically Signed   By: Inge Rise M.D.   On: 10/06/2019 11:47   CT Chest High Resolution  Result Date: 10/17/2019 CLINICAL DATA:  86 year old male with  history of respiratory failure. COVID-19 infection with increasing oxygen requirements. EXAM: CT CHEST WITHOUT CONTRAST CT ANGIOGRAPHY CHEST WITH CONTRAST TECHNIQUE: Multidetector CT imaging of the chest was performed following the standard protocol without intravenous contrast. High resolution imaging of the lungs, as well as inspiratory and expiratory imaging, was performed. Additional multidetector CT imaging of the chest was performed using the standard protocol during bolus administration of intravenous contrast. Multiplanar CT image reconstructions and MIPs were obtained to evaluate the vascular anatomy. COMPARISON:  Chest CTA 03/03/2013. FINDINGS: Cardiovascular: Although study is limited by considerable patient respiratory motion, there is a small nonocclusive central filling defect in a right lower lobe segmental sized branch (axial image 129 of series 7) of the pulmonary arteries, indicative of pulmonary embolism. No other larger more central or lobar filling defect is noted. Heart size is enlarged with biatrial dilatation (right greater than left). There is no significant pericardial fluid, thickening or pericardial calcification. There is aortic atherosclerosis, as well as atherosclerosis of the great vessels of the mediastinum and the coronary arteries, including calcified atherosclerotic plaque in the left main, left anterior descending, left circumflex and right coronary arteries. Thickening calcification of the aortic valve and calcifications of the mitral annulus. Right-sided pacemaker device in place with lead tips terminating in the right atrial appendage and right ventricle. Mediastinum/Nodes: No pathologically enlarged mediastinal or hilar lymph nodes. Moderate-sized hiatal hernia. No axillary lymphadenopathy. Lungs/Pleura: Patchy multifocal areas of ground-glass attenuation, septal thickening and frank airspace consolidation in the lungs bilaterally (right much greater than left). The densest  areas of consolidation are in the right upper and right lower lobe where there is some subpleural sparing. Moderate right and small left pleural effusions lying dependently. Upper Abdomen: Aortic atherosclerosis. Musculoskeletal: There are no aggressive appearing lytic or blastic lesions noted in the visualized portions of the skeleton. IMPRESSION: 1. Tiny nonobstructive segmental sized embolus in the right lower lobe pulmonary arteries, as above. 2. Severe multilobar bilateral pneumonia (right greater than left), compatible with reported COVID-19 infection. 3. Cardiomegaly with biatrial dilatation. 4. Moderate right and small left pleural effusions. 5. Aortic atherosclerosis, in addition to left main and 3 vessel coronary artery disease. 6. There are calcifications of the aortic valve and mitral annulus. Echocardiographic correlation for evaluation of potential valvular dysfunction may be warranted if clinically indicated. Aortic Atherosclerosis (ICD10-I70.0). Electronically Signed   By: Vinnie Langton M.D.   On: 10/17/2019 10:26   DG Chest Portable 1 View  Result Date: 10/17/2019 CLINICAL DATA:  Shortness of breath with increasing O2 requirement EXAM: PORTABLE CHEST 1 VIEW COMPARISON:  Five days ago FINDINGS: Enlarged heart. Bilateral airspace disease more dense on the right where there is also some volume loss. Cardiomegaly. Unremarkable dual-chamber pacer positioning. IMPRESSION: Stable pulmonary infiltrates asymmetric to the right.  Electronically Signed   By: Monte Fantasia M.D.   On: 10/17/2019 05:28   DG CHEST PORT 1 VIEW  Result Date: 10/12/2019 CLINICAL DATA:  Shortness of breath EXAM: PORTABLE CHEST 1 VIEW COMPARISON:  10/11/2019 FINDINGS: Right pacer remains in place, unchanged. Cardiomegaly. Diffuse bilateral airspace disease, right greater than left. No visible significant effusions. No acute bony abnormality. IMPRESSION: Stable diffuse bilateral airspace disease, right greater than left.  Electronically Signed   By: Rolm Baptise M.D.   On: 10/12/2019 08:43   DG Chest Port 1 View  Result Date: 10/11/2019 CLINICAL DATA:  Fever.  Hypoxia.  History of COVID-19. EXAM: PORTABLE CHEST 1 VIEW COMPARISON:  09/29/2019. FINDINGS: Cardiac pacer with lead tips over the right atrium right ventricle. Stable cardiomegaly. Diffuse bilateral pulmonary infiltrates, right side greater than left. Diffuse right lung infiltrates progressed from prior exam. Low lung volumes particularly on the right. No pleural effusion or pneumothorax. IMPRESSION: 1.  Cardiac pacer noted stable position.  Stable cardiomegaly. 2. Diffuse bilateral pulmonary infiltrates, right side greater than left. Diffuse right lung has progressed from prior exam. Low lung volumes, particularly on the right. Electronically Signed   By: Marcello Moores  Register   On: 10/11/2019 07:10   DG CHEST PORT 1 VIEW  Result Date: 09/29/2019 CLINICAL DATA:  COVID-19 pneumonia. EXAM: PORTABLE CHEST 1 VIEW COMPARISON:  Single view of the chest 09/25/2019. FINDINGS: Right worse than left airspace disease persists. Aeration in the lower lung zones has improved compared to the prior study. No pneumothorax or pleural effusion. Heart size is upper normal. Atherosclerosis and pacing device noted. IMPRESSION: Right worse than left airspace disease persists but has improved. Electronically Signed   By: Inge Rise M.D.   On: 09/29/2019 12:49   DG Chest Port 1 View  Result Date: 09/25/2019 CLINICAL DATA:  COVID-19 EXAM: PORTABLE CHEST 1 VIEW COMPARISON:  Portable exam 1448 hours compared to 09/24/2019 FINDINGS: RIGHT subclavian transvenous pacemaker leads project over RIGHT atrium and RIGHT ventricle. Enlargement of cardiac silhouette. Atherosclerotic calcification aorta. Mediastinal contours and pulmonary vascularity normal. Patchy airspace infiltrates bilaterally consistent with multifocal pneumonia and history of COVID-19, greater on RIGHT. Skin fold projects over  LEFT chest. No pleural effusion or pneumothorax. Diffuse osseous demineralization with BILATERAL glenohumeral degenerative changes and probable BILATERAL chronic rotator cuff tears. IMPRESSION: Patchy BILATERAL pulmonary infiltrates RIGHT greater than LEFT consistent with multifocal pneumonia and history of COVID-19, little changed. Aortic Atherosclerosis (ICD10-I70.0). Electronically Signed   By: Lavonia Dana M.D.   On: 09/25/2019 15:28   DG Swallowing Func-Speech Pathology  Result Date: 10/17/2019 Objective Swallowing Evaluation: Type of Study: MBS-Modified Barium Swallow Study  Patient Details Name: Robert Cuevas MRN: [id number] Date of Birth: August 29, 1930 Today's Date: 10/17/2019 Time: SLP Start Time (ACUTE ONLY): 0263 -SLP Stop Time (ACUTE ONLY): 7858 SLP Time Calculation (min) (ACUTE ONLY): 28 min Past Medical History: Past Medical History: Diagnosis Date . Cancer ([place])   skin cancer removed from head . Complete heart block ([place])   a. s/p gen change 07/2011. b. Pacer pocket infx 02/2013 - s/p extraction, temp perm placement, then eventual implantation of new St. Jude pacemaker 03/04/13. . Coronary artery calcification   Seen on CT 02/2013. . High cholesterol  . HOH (hard of hearing)  . Hyperglycemia  . Hypertension  . Pacemaker- St Jude  03/06/2013  Extracted 5/14 Reimplant 5/14  . Paroxysmal atrial fibrillation ([place]) 01/04/2015 Past Surgical History: Past Surgical History: Procedure Laterality Date . GENERATOR REMOVAL N/A 03/02/2013  Procedure: GENERATOR  REMOVAL;  Surgeon: Evans Lance, MD;  Location: [facility];  Service: Cardiovascular;  Laterality: N/A; . ICD LEAD REMOVAL N/A 03/02/2013  Procedure: ICD LEAD REMOVAL;  Surgeon: Evans Lance, MD;  Location: [facility];  Service: Cardiovascular;  Laterality: N/A; . INGUINAL HERNIA REPAIR Bilateral  . PACEMAKER GENERATOR CHANGE N/A 09/19/2011  Procedure: PACEMAKER GENERATOR CHANGE;  Surgeon: Sanda Klein, MD;  Location: [facility] CATH LAB;  Service: Cardiovascular;  Laterality:  N/A; . PACEMAKER INSERTION   . PACEMAKER LEAD REMOVAL N/A 03/02/2013  Procedure: PACEMAKER LEAD REMOVAL;  Surgeon: Evans Lance, MD;  Location: [facility];  Service: Cardiovascular;  Laterality: N/A; . PERMANENT PACEMAKER INSERTION N/A 03/04/2013  Procedure: PERMANENT PACEMAKER INSERTION;  Surgeon: Evans Lance, MD;  Location: [facility] CATH LAB;  Service: Cardiovascular;  Laterality: N/A; HPI: Pt is an 86 year old man presenting with recurrent hypoxemic respiratory failure after having COVID-19 in early December. Pt was evaluated by SLP on 12/22 with no overt s/s of aspiration noted clinically. PMH: afib, HTN, HLD, pacemaker-dependence  Subjective: denies any trouble swallowing Assessment / Plan / Recommendation CHL IP CLINICAL IMPRESSIONS 10/17/2019 Clinical Impression  Pt has a mild oropharyngeal dysphagia with silent aspiration of thin liquids that appears to be primarily related to timing issues and mild, generalized weakness. His oral phase is mildly prolonged, particularly his mastication even though he has his dentures in place for the study. There is mild oral/lingual residue after the swallow as well as mild-moderate residue across the base of tongue, valleculae, and pyriform sinuses. When pt takes a small, single sip of thin liquid he does well; however, he typically drinks with more impulsivity, and when he does, this ultimately results in silent aspiration. He can achieve adequate LVC, but when he already has pharyngeal residue present and his timing is minimally off, he does not get his laryngeal vestibule sealed in time. Penetration occurs with nectar thick liquids when consumed in the same fashion, but aspiration is not observed. Pt can clear thin and nectar thick liquids from his laryngeal vestibule well with a cued cough or throat clear. Recommend Dys 2 diet to facilitate oropharyngeal clearance and small, single cup sips of nectar thick liquids while pt's respiratory status is more compromised. Will f/u  with pt to try to advance diet if strategies can be consistently implemented as his overall strength and respirations improve.  SLP Visit Diagnosis Dysphagia, oropharyngeal phase (R13.12) Attention and concentration deficit following -- Frontal lobe and executive function deficit following -- Impact on safety and function Mild aspiration risk;Moderate aspiration risk   CHL IP TREATMENT RECOMMENDATION 10/17/2019 Treatment Recommendations Therapy as outlined in treatment plan below   Prognosis 10/17/2019 Prognosis for Safe Diet Advancement Fair Barriers to Reach Goals -- Barriers/Prognosis Comment -- CHL IP DIET RECOMMENDATION 10/17/2019 SLP Diet Recommendations Dysphagia 2 (Fine chop) solids;Nectar thick liquid Liquid Administration via Cup;No straw Medication Administration Crushed with puree Compensations Minimize environmental distractions;Slow rate;Small sips/bites;Other (Comment);Clear throat intermittently Postural Changes Remain semi-upright after after feeds/meals (Comment);Seated upright at 90 degrees   CHL IP OTHER RECOMMENDATIONS 10/17/2019 Recommended Consults -- Oral Care Recommendations Oral care BID Other Recommendations --   CHL IP FOLLOW UP RECOMMENDATIONS 10/17/2019 Follow up Recommendations Skilled Nursing facility   [facility] IP FREQUENCY AND DURATION 10/17/2019 Speech Therapy Frequency (ACUTE ONLY) min 2x/week Treatment Duration 2 weeks      CHL IP ORAL PHASE 10/17/2019 Oral Phase Impaired Oral - Pudding Teaspoon -- Oral - Pudding Cup -- Oral - Honey Teaspoon -- Oral -  Honey Cup -- Oral - Nectar Teaspoon -- Oral - Nectar Cup Delayed oral transit;Decreased bolus cohesion Oral - Nectar Straw -- Oral - Thin Teaspoon -- Oral - Thin Cup Delayed oral transit;Decreased bolus cohesion Oral - Thin Straw Delayed oral transit;Decreased bolus cohesion Oral - Puree Delayed oral transit;Decreased bolus cohesion;Lingual/palatal residue Oral - Mech Soft Delayed oral transit;Decreased bolus cohesion;Lingual/palatal  residue;Impaired mastication Oral - Regular -- Oral - Multi-Consistency -- Oral - Pill -- Oral Phase - Comment --  CHL IP PHARYNGEAL PHASE 10/17/2019 Pharyngeal Phase Impaired Pharyngeal- Pudding Teaspoon -- Pharyngeal -- Pharyngeal- Pudding Cup -- Pharyngeal -- Pharyngeal- Honey Teaspoon -- Pharyngeal -- Pharyngeal- Honey Cup -- Pharyngeal -- Pharyngeal- Nectar Teaspoon -- Pharyngeal -- Pharyngeal- Nectar Cup Reduced tongue base retraction;Reduced pharyngeal peristalsis;Pharyngeal residue - valleculae;Pharyngeal residue - pyriform;Penetration/Aspiration during swallow Pharyngeal Material enters airway, remains ABOVE vocal cords and not ejected out Pharyngeal- Nectar Straw -- Pharyngeal -- Pharyngeal- Thin Teaspoon -- Pharyngeal -- Pharyngeal- Thin Cup Reduced tongue base retraction;Reduced pharyngeal peristalsis;Pharyngeal residue - valleculae;Pharyngeal residue - pyriform;Penetration/Aspiration during swallow Pharyngeal Material enters airway, passes BELOW cords without attempt by patient to eject out (silent aspiration) Pharyngeal- Thin Straw Reduced tongue base retraction;Reduced pharyngeal peristalsis;Pharyngeal residue - valleculae;Pharyngeal residue - pyriform;Penetration/Aspiration during swallow Pharyngeal Material enters airway, passes BELOW cords without attempt by patient to eject out (silent aspiration) Pharyngeal- Puree Reduced tongue base retraction;Reduced pharyngeal peristalsis;Pharyngeal residue - valleculae;Pharyngeal residue - pyriform Pharyngeal -- Pharyngeal- Mechanical Soft Reduced tongue base retraction;Reduced pharyngeal peristalsis;Pharyngeal residue - valleculae;Pharyngeal residue - pyriform Pharyngeal -- Pharyngeal- Regular -- Pharyngeal -- Pharyngeal- Multi-consistency -- Pharyngeal -- Pharyngeal- Pill -- Pharyngeal -- Pharyngeal Comment --  CHL IP CERVICAL ESOPHAGEAL PHASE 10/17/2019 Cervical Esophageal Phase WFL Pudding Teaspoon -- Pudding Cup -- Honey Teaspoon -- Honey Cup -- Nectar  Teaspoon -- Nectar Cup -- Nectar Straw -- Thin Teaspoon -- Thin Cup -- Thin Straw -- Puree -- Mechanical Soft -- Regular -- Multi-consistency -- Pill -- Cervical Esophageal Comment -- Osie Bond., M.A. [place] Pager [phone] Office [phone] 10/17/2019, 5:03 PM              ECHOCARDIOGRAM COMPLETE  Result Date: 10/18/2019   ECHOCARDIOGRAM REPORT   Patient Name:   Robert Cuevas Date of Exam: 10/18/2019 Medical Rec #:  [id number]      Height:       68.0 in Accession #:    [id number]     Weight:       126.1 lb Date of Birth:  1930-05-20      BSA:          1.68 m Patient Age:    39 years       BP:           138/53 mmHg Patient Gender: M              HR:           70 bpm. Exam Location:  Inpatient Procedure: 2D Echo, Cardiac Doppler and Color Doppler Indications:    Dyspnea 786.09  History:        Patient has prior history of Echocardiogram examinations, most                 recent 01/10/2015. Pacemaker, Arrythmias:Atrial Fibrillation,                 Signs/Symptoms:Hypotension; Risk Factors:Diabetes. Sepsis.                 Complete heart block.  Sonographer:  Paulita Fujita RDCS Referring Phys: [id number] [facility]  Sonographer Comments: Suboptimal parasternal window. IMPRESSIONS  1. Left ventricular ejection fraction, by visual estimation, is 45 to 50%. The left ventricle has normal function. There is no left ventricular hypertrophy.  2. The left ventricle demonstrates regional wall motion abnormalities.  3. Interventricular septal dyssynchrony and hypokinesis consistent with RV pacing.  4. Global right ventricle has normal systolic function.The right ventricular size is moderately enlarged. No increase in right ventricular wall thickness.  5. Left atrial size was normal.  6. Right atrial size was normal.  7. Mild mitral annular calcification.  8. The mitral valve is normal in structure. No evidence of mitral valve regurgitation. No evidence of mitral stenosis.  9. The  tricuspid valve is normal in structure. 10. The aortic valve is normal in structure. Aortic valve regurgitation is mild. Mild aortic valve stenosis. 11. The pulmonic valve was normal in structure. Pulmonic valve regurgitation is not visualized. 12. Moderately elevated pulmonary artery systolic pressure. 13. A pacer wire is visualized. 14. The inferior vena cava is normal in size with <50% respiratory variability, suggesting right atrial pressure of 8 mmHg. FINDINGS  Left Ventricle: Left ventricular ejection fraction, by visual estimation, is 45 to 50%. The left ventricle has normal function. The left ventricle demonstrates regional wall motion abnormalities. There is no left ventricular hypertrophy. Normal left atrial pressure. Interventricular septal dyssynchrony and hypokinesis consistent with RV pacing. Right Ventricle: The right ventricular size is moderately enlarged. No increase in right ventricular wall thickness. Global RV systolic function is has normal systolic function. The tricuspid regurgitant velocity is 3.01 m/s, and with an assumed right atrial pressure of 8 mmHg, the estimated right ventricular systolic pressure is moderately elevated at 44.2 mmHg. Left Atrium: Left atrial size was normal in size. Right Atrium: Right atrial size was normal in size Pericardium: There is no evidence of pericardial effusion. Mitral Valve: The mitral valve is normal in structure. Mild mitral annular calcification. No evidence of mitral valve regurgitation. No evidence of mitral valve stenosis by observation. Tricuspid Valve: The tricuspid valve is normal in structure. Tricuspid valve regurgitation moderate-severe. Aortic Valve: The aortic valve is normal in structure.. There is moderate thickening and moderate calcification of the aortic valve. Aortic valve regurgitation is mild. Mild aortic stenosis is present. Moderate aortic valve annular calcification. There is moderate thickening of the aortic valve. There is  moderate calcification of the aortic valve. Pulmonic Valve: The pulmonic valve was normal in structure. Pulmonic valve regurgitation is not visualized. Pulmonic regurgitation is not visualized. Aorta: The aortic root, ascending aorta and aortic arch are all structurally normal, with no evidence of dilitation or obstruction. Venous: The inferior vena cava is normal in size with less than 50% respiratory variability, suggesting right atrial pressure of 8 mmHg. IAS/Shunts: No atrial level shunt detected by color flow Doppler. There is no evidence of a patent foramen ovale. No ventricular septal defect is seen or detected. There is no evidence of an atrial septal defect. Additional Comments: A pacer wire is visualized.  LEFT VENTRICLE PLAX 2D LVIDd:         3.60 cm LVIDs:         2.70 cm LV PW:         1.00 cm LV IVS:        1.00 cm LVOT diam:     1.70 cm LV SV:         27 ml LV SV Index:   16.64 LVOT Area:  2.27 cm  LV Volumes (MOD) LV area d, A2C:    20.20 cm LV area d, A4C:    18.90 cm LV area s, A2C:    16.60 cm LV area s, A4C:    14.60 cm LV major d, A2C:   5.61 cm LV major d, A4C:   6.32 cm LV major s, A2C:   5.64 cm LV major s, A4C:   5.57 cm LV vol d, MOD A2C: 60.2 ml LV vol d, MOD A4C: 47.8 ml LV vol s, MOD A2C: 39.2 ml LV vol s, MOD A4C: 32.4 ml LV SV MOD A2C:     21.0 ml LV SV MOD A4C:     47.8 ml LV SV MOD BP:      21.0 ml RIGHT VENTRICLE RV S prime:     13.60 cm/s TAPSE (M-mode): 2.3 cm LEFT ATRIUM           Index       RIGHT ATRIUM           Index LA diam:      3.60 cm 2.14 cm/m  RA Area:     14.10 cm LA Vol (A2C): 34.5 ml 20.54 ml/m RA Volume:   39.80 ml  23.69 ml/m LA Vol (A4C): 47.5 ml 28.27 ml/m  AORTIC VALVE LVOT Vmax:   71.80 cm/s LVOT Vmean:  47.100 cm/s LVOT VTI:    0.113 m  AORTA Ao Root diam: 3.40 cm TRICUSPID VALVE TR Peak grad:   36.2 mmHg TR Vmax:        301.00 cm/s  SHUNTS Systemic VTI:  0.11 m Systemic Diam: 1.70 cm  Skeet Latch MD Electronically signed by Skeet Latch  MD Signature Date/Time: 10/18/2019/2:46:13 PM    Final        Discharge Exam: Vitals:   10/24/19 2202 10/25/19 0623  BP: 124/67 (!) 103/53  Pulse: 70 69  Resp: 20 (!) 22  Temp: 97.7 F (36.5 C) (!) 97.5 F (36.4 C)  SpO2: 97% 96%    GENERAL: Frail elderly male.  No apparent distress. HEENT: MMM.  Vision and hearing grossly intact.  NECK: Supple.  No apparent JVD.  RESP:  No IWOB.  Fair aeration bilaterally. CVS: Regular rhythm.  Normal rate.Marland Kitchen Heart sounds normal.  ABD/GI/GU: Bowel sounds present. Soft. Non tender.  MSK/EXT: Significant muscle mass wasting. SKIN: Stage II sacral decubitus NEURO: Awake, alert and oriented to self, partial place. PSYCH: Calm.  No distress.   The results of significant diagnostics from this hospitalization (including imaging, microbiology, ancillary and laboratory) are listed below for reference.     Microbiology: Recent Results (from the past 240 hour(s))  Body fluid culture (includes gram stain)     Status: None   Collection Time: 10/17/19  2:46 PM   Specimen: Pleural Fluid  Result Value Ref Range Status   Specimen Description   Final    PLEURAL Performed at [facility] [address], [place], [place] [address]    Special Requests   Final    NONE Performed at [facility], [place] [address], [place], [place] [address]    Gram Stain NO WBC SEEN NO ORGANISMS SEEN   Final   Culture   Final    NO GROWTH 3 DAYS Performed at [facility], [place] [address], [place],  [address]    Report Status 10/21/2019 FINAL  Final     Labs: BNP (last 3 results) Recent Labs  10/15/19 0430 10/17/19 0833  BNP 274.0* 935.7*   Basic Metabolic Panel: Recent Labs  Lab 10/19/19 0126 10/20/19 0526 10/21/19 0432 10/22/19 0432 10/24/19 0512  NA 139 138 144 142 147*  K 2.9* 3.5 4.4 3.2* 4.5  CL 97* 96* 101 101 105  CO2 30 31 35* 31 34*  GLUCOSE 233* 289* 198* 146* 130*  BUN 17 35* 42* 37*  34*  CREATININE 0.47* 0.53* 0.65 0.42* 0.44*  CALCIUM 8.8* 8.7* 9.0 8.4* 9.3  MG 1.8  --   --   --  1.9   Liver Function Tests: Recent Labs  Lab 10/24/19 0512  AST 92*  ALT 163*  ALKPHOS 249*  BILITOT 2.1*  PROT 6.0*  ALBUMIN 2.8*   No results for input(s): LIPASE, AMYLASE in the last 168 hours. No results for input(s): AMMONIA in the last 168 hours. CBC: Recent Labs  Lab 10/24/19 0512  WBC 10.3  NEUTROABS 8.8*  HGB 16.8  HCT 52.6*  MCV 99.4  PLT 174   Cardiac Enzymes: No results for input(s): CKTOTAL, CKMB, CKMBINDEX, TROPONINI in the last 168 hours. BNP: Invalid input(s): POCBNP CBG: Recent Labs  Lab 10/24/19 0747 10/24/19 1214 10/24/19 1228 10/24/19 1647 10/24/19 2210  GLUCAP 107* 265* 247* 188* 258*   D-Dimer No results for input(s): DDIMER in the last 72 hours. Hgb A1c No results for input(s): HGBA1C in the last 72 hours. Lipid Profile No results for input(s): CHOL, HDL, LDLCALC, TRIG, CHOLHDL, LDLDIRECT in the last 72 hours. Thyroid function studies No results for input(s): TSH, T4TOTAL, T3FREE, THYROIDAB in the last 72 hours.  Invalid input(s): FREET3 Anemia work up No results for input(s): VITAMINB12, FOLATE, FERRITIN, TIBC, IRON, RETICCTPCT in the last 72 hours. Urinalysis    Component Value Date/Time   COLORURINE BROWN (A) 10/11/2019 0642   APPEARANCEUR TURBID (A) 10/11/2019 0642   LABSPEC 1.025 10/11/2019 0642   PHURINE 6.5 10/11/2019 0642   GLUCOSEU 100 (A) 10/11/2019 0642   HGBUR LARGE (A) 10/11/2019 0642   BILIRUBINUR LARGE (A) 10/11/2019 0642   KETONESUR 15 (A) 10/11/2019 0642   PROTEINUR >300 (A) 10/11/2019 0642   NITRITE POSITIVE (A) 10/11/2019 0642   LEUKOCYTESUR MODERATE (A) 10/11/2019 0642   Sepsis Labs Invalid input(s): PROCALCITONIN,  WBC,  LACTICIDVEN   Time coordinating discharge: 35 minutes  SIGNED:  Mercy Riding, MD  [facility] 10/25/2019, 7:29 AM  If 7PM-7AM, please contact  night-coverage [email or web address] Password [email or web address]

## 2019-10-25 NOTE — TOC Transition Note (Signed)
Transition of Care [facility]) - CM/SW Discharge Note   Patient Details  Name: Robert Cuevas MRN: [id number] Date of Birth: Mar 29, 1930  Transition of Care [facility]) CM/SW Contact:  Dessa Phi, RN Phone Number: 10/25/2019, 11:43 AM   Clinical Narrative:HTA received auth-auth#[id number] eff 10/25/19-12/22/19. [facility] called for transport-Nsg aware.       Final next level of care: Home w Hospice Care Barriers to Discharge: No Barriers Identified   Patient Goals and CMS Choice        Discharge Placement                Patient to be transferred to facility by: [place] Name of family member notified: Ruby Patient and family notified of of transfer: 10/25/19  Discharge Plan and Services   Discharge Planning Services: CM Consult            DME Arranged: Oxygen, Walker rolling DME Agency: [employer] Date DME Agency Contacted: 10/22/19 Time DME Agency Contacted: [phone] Representative spoke with at DME Agency: [place] ([place]) Interventions     Readmission Risk Interventions Readmission Risk Prevention Plan 10/02/2019  Medication Review (RN CM) Complete  Some recent data might be hidden

## 2019-10-25 NOTE — Plan of Care (Signed)
PCCM Plan of [place] previously has been following this patient and has followed from a distance, remaining available PRN. -PCCM notes that this patient is planned for discharge home with hospice. Please engage PCCM if needed.     Eliseo Gum MSN, AGACNP-BC [place] [phone] If no answer, [phone] 10/25/2019, 9:42 AM

## 2019-10-25 NOTE — Progress Notes (Signed)
Hypoglycemic Event  CBG: 65  Treatment: 4 oz juice/soda  Symptoms: None  Follow-up CBG: Time: 0807 CBG Result:77  Possible Reasons for Event: Inadequate meal intake  Comments/MD notified: MD [place] made aware. Pt fed some breakfast and encouraged to drink some of shake on breakfast tray. Will recheck again in 30 minutes.    Rosana Fret

## 2019-11-17 ENCOUNTER — Telehealth: Payer: Self-pay

## 2019-11-17 NOTE — Telephone Encounter (Signed)
Sorry to hear he is not doing well. No intervention planned for the atrial fibrillation under the circumstances.

## 2019-11-17 NOTE — Telephone Encounter (Signed)
PM alert received for ongoing AF.  Pt has known PAF on [place].  Recently admitted to [facility] services.  Reviewed by ASeiler, NP, alerts turned off due to Hospice status.

## 2019-12-07 ENCOUNTER — Ambulatory Visit (INDEPENDENT_AMBULATORY_CARE_PROVIDER_SITE_OTHER): Payer: No Typology Code available for payment source | Admitting: *Deleted

## 2019-12-07 DIAGNOSIS — Z950 Presence of cardiac pacemaker: Secondary | ICD-10-CM

## 2019-12-07 LAB — CUP PACEART REMOTE DEVICE CHECK
Battery Remaining Longevity: 19 mo
Battery Remaining Percentage: 35 %
Battery Voltage: 2.83 V
Brady Statistic AP VP Percent: 93 %
Brady Statistic AP VS Percent: 1 %
Brady Statistic AS VP Percent: 6.6 %
Brady Statistic AS VS Percent: 1 %
Brady Statistic RA Percent Paced: 90 %
Brady Statistic RV Percent Paced: 99 %
Date Time Interrogation Session: 20210217020310
Implantable Lead Implant Date: 19930618
Implantable Lead Implant Date: 19930618
Implantable Lead Location: 753859
Implantable Lead Location: 753860
Implantable Lead Serial Number: 23032978
Implantable Pulse Generator Implant Date: 20140516
Lead Channel Impedance Value: 380 Ohm
Lead Channel Impedance Value: 430 Ohm
Lead Channel Pacing Threshold Amplitude: 0.5 V
Lead Channel Pacing Threshold Amplitude: 0.75 V
Lead Channel Pacing Threshold Pulse Width: 0.5 ms
Lead Channel Pacing Threshold Pulse Width: 0.5 ms
Lead Channel Sensing Intrinsic Amplitude: 0.5 mV
Lead Channel Setting Pacing Amplitude: 1 V
Lead Channel Setting Pacing Amplitude: 5 V
Lead Channel Setting Pacing Pulse Width: 0.5 ms
Lead Channel Setting Sensing Sensitivity: 6 mV
Pulse Gen Model: 2210
Pulse Gen Serial Number: 7474476

## 2019-12-07 NOTE — Progress Notes (Signed)
PPM remote 

## 2020-03-07 ENCOUNTER — Ambulatory Visit (INDEPENDENT_AMBULATORY_CARE_PROVIDER_SITE_OTHER): Payer: No Typology Code available for payment source | Admitting: *Deleted

## 2020-03-07 DIAGNOSIS — I480 Paroxysmal atrial fibrillation: Secondary | ICD-10-CM

## 2020-03-07 DIAGNOSIS — I442 Atrioventricular block, complete: Secondary | ICD-10-CM

## 2020-03-07 LAB — CUP PACEART REMOTE DEVICE CHECK
Battery Remaining Longevity: 16 mo
Battery Remaining Percentage: 29 %
Battery Voltage: 2.81 V
Brady Statistic AP VP Percent: 91 %
Brady Statistic AP VS Percent: 1 %
Brady Statistic AS VP Percent: 8.8 %
Brady Statistic AS VS Percent: 1 %
Brady Statistic RA Percent Paced: 86 %
Brady Statistic RV Percent Paced: 99 %
Date Time Interrogation Session: 20210519020019
Implantable Lead Implant Date: 19930618
Implantable Lead Implant Date: 19930618
Implantable Lead Location: 753859
Implantable Lead Location: 753860
Implantable Lead Serial Number: 23032978
Implantable Pulse Generator Implant Date: 20140516
Lead Channel Impedance Value: 380 Ohm
Lead Channel Impedance Value: 400 Ohm
Lead Channel Pacing Threshold Amplitude: 0.5 V
Lead Channel Pacing Threshold Amplitude: 0.625 V
Lead Channel Pacing Threshold Pulse Width: 0.5 ms
Lead Channel Pacing Threshold Pulse Width: 0.5 ms
Lead Channel Sensing Intrinsic Amplitude: 0.5 mV
Lead Channel Setting Pacing Amplitude: 0.875
Lead Channel Setting Pacing Amplitude: 5 V
Lead Channel Setting Pacing Pulse Width: 0.5 ms
Lead Channel Setting Sensing Sensitivity: 6 mV
Pulse Gen Model: 2210
Pulse Gen Serial Number: 7474476

## 2020-03-09 NOTE — Progress Notes (Signed)
Remote pacemaker transmission.   

## 2020-05-01 NOTE — Progress Notes (Signed)
Called patient to schedule appt. States that patient will be in hospice 6 more weeks and will call back once it is over to follow up with Dr. Loletha Grayer.

## 2020-06-06 ENCOUNTER — Ambulatory Visit (INDEPENDENT_AMBULATORY_CARE_PROVIDER_SITE_OTHER): Admitting: *Deleted

## 2020-06-06 DIAGNOSIS — I442 Atrioventricular block, complete: Secondary | ICD-10-CM

## 2020-06-06 LAB — CUP PACEART REMOTE DEVICE CHECK
Battery Remaining Longevity: 13 mo
Battery Remaining Percentage: 25 %
Battery Voltage: 2.8 V
Brady Statistic AP VP Percent: 91 %
Brady Statistic AP VS Percent: 1 %
Brady Statistic AS VP Percent: 9.2 %
Brady Statistic AS VS Percent: 1 %
Brady Statistic RA Percent Paced: 79 %
Brady Statistic RV Percent Paced: 99 %
Date Time Interrogation Session: 20210818021022
Implantable Lead Implant Date: 19930618
Implantable Lead Implant Date: 19930618
Implantable Lead Location: 753859
Implantable Lead Location: 753860
Implantable Lead Serial Number: 23032978
Implantable Pulse Generator Implant Date: 20140516
Lead Channel Impedance Value: 380 Ohm
Lead Channel Impedance Value: 400 Ohm
Lead Channel Pacing Threshold Amplitude: 0.5 V
Lead Channel Pacing Threshold Amplitude: 0.625 V
Lead Channel Pacing Threshold Pulse Width: 0.5 ms
Lead Channel Pacing Threshold Pulse Width: 0.5 ms
Lead Channel Sensing Intrinsic Amplitude: 0.5 mV
Lead Channel Setting Pacing Amplitude: 0.875
Lead Channel Setting Pacing Amplitude: 5 V
Lead Channel Setting Pacing Pulse Width: 0.5 ms
Lead Channel Setting Sensing Sensitivity: 6 mV
Pulse Gen Model: 2210
Pulse Gen Serial Number: 7474476

## 2020-06-08 NOTE — Progress Notes (Signed)
Remote pacemaker transmission.   

## 2020-06-11 ENCOUNTER — Encounter: Payer: Self-pay | Admitting: Cardiovascular Disease

## 2020-06-11 ENCOUNTER — Other Ambulatory Visit: Payer: Self-pay

## 2020-06-11 ENCOUNTER — Ambulatory Visit (INDEPENDENT_AMBULATORY_CARE_PROVIDER_SITE_OTHER): Admitting: Cardiovascular Disease

## 2020-06-11 VITALS — BP 128/60 | HR 70 | Ht 68.0 in | Wt 129.6 lb

## 2020-06-11 DIAGNOSIS — I4819 Other persistent atrial fibrillation: Secondary | ICD-10-CM

## 2020-06-11 DIAGNOSIS — Z950 Presence of cardiac pacemaker: Secondary | ICD-10-CM | POA: Diagnosis not present

## 2020-06-11 DIAGNOSIS — Z8616 Personal history of COVID-19: Secondary | ICD-10-CM

## 2020-06-11 DIAGNOSIS — Z7901 Long term (current) use of anticoagulants: Secondary | ICD-10-CM | POA: Diagnosis not present

## 2020-06-11 DIAGNOSIS — I442 Atrioventricular block, complete: Secondary | ICD-10-CM

## 2020-06-11 DIAGNOSIS — Z515 Encounter for palliative care: Secondary | ICD-10-CM

## 2020-06-11 DIAGNOSIS — I10 Essential (primary) hypertension: Secondary | ICD-10-CM | POA: Diagnosis not present

## 2020-06-11 MED ORDER — METOPROLOL TARTRATE 25 MG PO TABS: 12.5000 mg | ORAL_TABLET | Freq: Two times a day (BID) | ORAL | 11 refills | Status: AC

## 2020-06-11 NOTE — Progress Notes (Signed)
Patient ID: GOEBEL HELLUMS, male   DOB: [birth year], 86 y.o.   MRN: [id number]    Cardiology Office Note    Date:  06/12/2020   ID:  AJAMU MAXON, DOB 08-09-1930, MRN [id number]  PCP:  [facility]  Cardiologist:   Sanda Klein, MD   Chief Complaint  Patient presents with  . Atrial Fibrillation  . Pacemaker Check    History of Present Illness:  TATSUYA OKRAY is a 86 y.o. male with complete heart block, infrequent paroxysmal atrial fibrillation, dual-chamber permanent pacemaker(current generator implanted in 2014 in the right subclavian area after he had a sterile left subclavian pocket erosion).  He had acute COVID-19 infection in December 5726, complicated by pneumonia, sepsis and respiratory failure.  He was hospitalized 3 times during the ensuing month.  He eventually went home with home hospice, but he has made a slow remarkable recovery, most of the credit being due to his wife who has taken excellent care of him and been a tremendous advocate.  He lost a tremendous amount of weight but is starting to gain it back again.  All his antihypertensives and his diabetes mountains were stopped.  He is able to walk on a daily basis with his wife support but spends most of his day in the chair or wheelchair.  He no longer requires oxygen, except during exercise and at night.  He is smiling and appears optimistic, although he appears very frail.  He has not had chest pain, dyspnea, orthopnea, PND, syncope and is not aware of any palpitations.  He develops mild swelling of the ankles towards the end of the day.  He is compliant with Eliquis anticoagulation and has not had any falls, injuries or serious bleeding problems.  As he started to gain back some weight, his wife has noticed episodes of elevated blood pressure in the 160s and his morning blood sugar has been increasing to the 120s-140s.  She occasionally gives him a dose of metoprolol 50 mg when the blood pressure appears to be  excessively high.  His pacemaker shows a marked increase in the burden of atrial fibrillation throughout December and January during the acute Covid infection, after which atrial fibrillation was virtually gone for about 3 months.  Subsequently over the last roughly 6 months there has been a steady increase in the prevalence of atrial fibrillation which is now roughly 100%.  He he has 100% ventricular pacing due to complete heart block and the arrhythmia has not appear to have any impact on his overall functional status.  Metoprolol was stopped since his blood pressure was very low.  Otherwise pacemaker function is normal.  He is device dependent.  Estimated generator longevity is about 1 year.  In the past, he has had some "noise" on the [facility] 2088 atrial lead which made evaluating the burden of atrial fibrillation or challenging, but the current electrogram clearly shows coarse atrial fibrillation.  He has known coronary artery calcifications by CT but had a normal nuclear stress test in 2012 (pacing induced septal artifact). He has normal left ventricular systolic function and does not have meaningful valvular abnormalities.  His primary care provider is Dr. Sydnee Cabal, M.D. at the [facility], [address], [place], [place] [address].  He had labs performed in July at the [facility]  Total cholesterol 133, triglycerides 88, HDL 32, LDL 83 Hemoglobin A1c 6.7%, hemoglobin 16, potassium 4.5, normal liver function tests, TSH 2.090  Past Medical History:  Diagnosis Date  . Cancer ([place])    skin cancer removed from head  . Complete heart block ([place])    a. s/p gen change 07/2011. b. Pacer pocket infx 02/2013 - s/p extraction, temp perm placement, then eventual implantation of new St. Jude pacemaker 03/04/13.  Marland Kitchen Complete heart block ([place]) 09/19/2011  . Coronary artery calcification    Seen on CT 02/2013.  . High cholesterol   . HOH (hard of hearing)   . Hyperglycemia   .  Hypertension   . 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 Pacemaker- St Jude  03/06/2013   Extracted 5/14 Reimplant 5/14   . Paroxysmal atrial fibrillation ([place]) 01/04/2015    Past Surgical History:  Procedure Laterality Date  . GENERATOR REMOVAL N/A 03/02/2013   Procedure: GENERATOR REMOVAL;  Surgeon: Evans Lance, MD;  Location: [facility];  Service: Cardiovascular;  Laterality: N/A;  . ICD LEAD REMOVAL N/A 03/02/2013   Procedure: ICD LEAD REMOVAL;  Surgeon: Evans Lance, MD;  Location: [facility];  Service: Cardiovascular;  Laterality: N/A;  . INGUINAL HERNIA REPAIR Bilateral   . PACEMAKER GENERATOR CHANGE N/A 09/19/2011   Procedure: PACEMAKER GENERATOR CHANGE;  Surgeon: Sanda Klein, MD;  Location: [facility] CATH LAB;  Service: Cardiovascular;  Laterality: N/A;  . PACEMAKER INSERTION    . PACEMAKER LEAD REMOVAL N/A 03/02/2013   Procedure: PACEMAKER LEAD REMOVAL;  Surgeon: Evans Lance, MD;  Location: [facility];  Service: Cardiovascular;  Laterality: N/A;  . PERMANENT PACEMAKER INSERTION N/A 03/04/2013   Procedure: PERMANENT PACEMAKER INSERTION;  Surgeon: Evans Lance, MD;  Location[facility] CATH LAB;  Service: Cardiovascular;  Laterality: N/A;    Current Medications: Outpatient Medications Prior to Visit  Medication Sig Dispense Refill  . apixaban (ELIQUIS) 5 MG TABS tablet Take 1 tablet (5 mg total) by mouth 2 (two) times daily. 60 tablet 6  . cholecalciferol (VITAMIN D3) 25 MCG (1000 UT) tablet Take 1,000 Units by mouth daily.    Marland Kitchen acetaminophen (TYLENOL) 325 MG tablet Take 2 tablets (650 mg total) by mouth every 6 (six) hours as needed for mild pain (or Fever >/= 101).    Marland Kitchen albuterol (VENTOLIN HFA) 108 (90 Base) MCG/ACT inhaler Inhale 2 puffs into the lungs every 6 (six) hours as needed for wheezing or shortness of breath. 8 g 1  . carboxymethylcellulose (REFRESH PLUS) 0.5 % SOLN Place 1 drop into both eyes daily as needed (dry eyes).    . collagenase (SANTYL) ointment Apply 1 application  topically daily. Apply nickel thickness amount to sacrum wound daily    . diclofenac Sodium (VOLTAREN) 1 % GEL Apply 2 g topically daily as needed (knee pain). 200 g 1  . Maltodextrin-Xanthan Gum (RESOURCE THICKENUP CLEAR) POWD Thicken liquids to nectar consistency. 125 g 6  . metoprolol tartrate (LOPRESSOR) 50 MG tablet Take 1 tablet (50 mg total) by mouth 2 (two) times daily. 180 tablet 1  . Omega-3 Fatty Acids (FISH OIL) 1200 MG CAPS Take 2,400 mg by mouth daily.    Marland Kitchen glipiZIDE (GLUCOTROL) 5 MG tablet Take 1 tablet (5 mg total) by mouth 2 (two) times daily. 180 tablet 1  . metFORMIN (GLUCOPHAGE) 1000 MG tablet Take 1 tablet (1,000 mg total) by mouth 2 (two) times daily with a meal. 180 tablet 1   No facility-administered medications prior to visit.     Allergies:   Patient has no known allergies.   Social History   Socioeconomic History  . Marital status: Married  Spouse name: Not on file  . Number of children: Not on file  . Years of education: Not on file  . Highest education level: Not on file  Occupational History  . Not on file  Tobacco Use  . Smoking status: Never Smoker  . Smokeless tobacco: Never Used  Vaping Use  . Vaping Use: Never used  Substance and Sexual Activity  . Alcohol use: No  . Drug use: No  . Sexual activity: Not on file  Other Topics Concern  . Not on file  Social History Narrative  . Not on file   Social Determinants of Health   Financial Resource Strain:   . Difficulty of Paying Living Expenses: Not on file  Food Insecurity:   . Worried About Charity fundraiser in the Last Year: Not on file  . Ran Out of Food in the Last Year: Not on file  Transportation Needs:   . Lack of Transportation (Medical): Not on file  . Lack of Transportation (Non-Medical): Not on file  Physical Activity:   . Days of Exercise per Week: Not on file  . Minutes of Exercise per Session: Not on file  Stress:   . Feeling of Stress : Not on file  Social  Connections:   . Frequency of Communication with Friends and Family: Not on file  . Frequency of Social Gatherings with Friends and Family: Not on file  . Attends Religious Services: Not on file  . Active Member of Clubs or Organizations: Not on file  . Attends Archivist Meetings: Not on file  . Marital Status: Not on file      ROS:   Please see the history of present illness.    ROS All other systems are reviewed and are negative.  PHYSICAL EXAM:   VS:  BP 128/60   Pulse 70   Ht 5\' 8"  (1.727 m)   Wt 129 lb 9.6 oz (58.8 kg)   SpO2 97%   BMI 19.71 kg/m     General: Alert, oriented x3, no distress, the right subclavian pacemaker site looks healthy.  He appears quite frail and very thin. Head: no evidence of trauma, PERRL, EOMI, no exophtalmos or lid lag, no myxedema, no xanthelasma; normal ears, nose and oropharynx Neck: normal jugular venous pulsations and no hepatojugular reflux; brisk carotid pulses without delay and no carotid bruits Chest: clear to auscultation, no signs of consolidation by percussion or palpation, normal fremitus, symmetrical and full respiratory excursions Cardiovascular: normal position and quality of the apical impulse, regular rhythm, normal first and paradoxically split second heart sounds, no murmurs, rubs or gallops Abdomen: no tenderness or distention, no masses by palpation, no abnormal pulsatility or arterial bruits, normal bowel sounds, no hepatosplenomegaly Extremities: no clubbing, cyanosis or edema; 2+ radial, ulnar and brachial pulses bilaterally; 2+ right femoral, posterior tibial and dorsalis pedis pulses; 2+ left femoral, posterior tibial and dorsalis pedis pulses; no subclavian or femoral bruits Neurological: grossly nonfocal Psych: Normal mood and affect     Wt Readings from Last 3 Encounters:  06/11/20 129 lb 9.6 oz (58.8 kg)  10/17/19 126 lb 1.7 oz (57.2 kg)  10/11/19 137 lb (62.1 kg)      Studies/Labs Reviewed:   EKG:  Ordered today, shows background atrial fibrillation with 100% ventricular pacing Recent Labs: Performed at the [facility] April 20, 2020  Total cholesterol 133, triglycerides 88, HDL 32, LDL 83 Hemoglobin A1c 6.7%, hemoglobin 16, potassium 4.5, normal liver function tests, TSH 2.090  ASSESSMENT:    1. Complete heart block (HCC)   2. Persistent atrial fibrillation ([place])   3. Pacemaker   4. Long term (current) use of anticoagulants   5. Essential hypertension   6. History of COVID-19   7. Hospice care patient      PLAN:  In order of problems listed above:  1. CHB: Pacemaker dependent. 2. Afib: Seems to be settling into a pattern of longstanding persistent atrial fibrillation.  He is not a good candidate for cardioversion and is not apparently symptomatic from the arrhythmia.Marland Kitchen CHADSVasc 4 (age 18, DM, HTN).  3. PPM: Normal pacemaker function. Remote downloads every 3 months, yearly visits. Current device was reimplanted on the contralateral side after sterile pocket erosion 2014.  It appears that he will be due for pacemaker generator change out in roughly 1 year. 4. Eliquis: Well tolerated, without bleeding complications. 5. HTN: Rather than the occasional dosing of metoprolol that his wife is using at this time, I would suggest starting metoprolol tartrate 12.5 mg twice daily. 6. Hospice care: Its been more than half a year since he has been under hospice care and he seems to be improving.  It is possible he will be discharged from hospice soon.     Medication Adjustments/Labs and Tests Ordered: Current medicines are reviewed at length with the patient today.  Concerns regarding medicines are outlined above.  Medication changes, Labs and Tests ordered today are listed in the Patient Instructions below. Patient Instructions  Medication Instructions:  DECREASE the Metoprolol Tartrate to 12.5 mg twice daily (half of a 25 mg tablet)  *If you need a refill on your cardiac medications before your  next appointment, please call your pharmacy*   Lab Work: None ordered If you have labs (blood work) drawn today and your tests are completely normal, you will receive your results only by: Marland Kitchen MyChart Message (if you have MyChart) OR . A paper copy in the mail If you have any lab test that is abnormal or we need to change your treatment, we will call you to review the results.   Testing/Procedures: None ordered   Follow-Up: At [facility], you and your health needs are our priority.  As part of our continuing mission to provide you with exceptional heart care, we have created designated Provider Care Teams.  These Care Teams include your primary Cardiologist (physician) and Advanced Practice Providers (APPs -  Physician Assistants and Nurse Practitioners) who all work together to provide you with the care you need, when you need it.  We recommend signing up for the patient portal called "MyChart".  Sign up information is provided on this After Visit Summary.  MyChart is used to connect with patients for Virtual Visits (Telemedicine).  Patients are able to view lab/test results, encounter notes, upcoming appointments, etc.  Non-urgent messages can be sent to your provider as well.   To learn more about what you can do with MyChart, go to [email or web address].    Your next appointment:   12 month(s)  The format for your next appointment:   In Person  Provider:   Sanda Klein, MD     Signed, Sanda Klein, MD  06/12/2020 4:35 PM    [address] [place], [place], [place]  [address] Phone: [phone]; Fax: [phone]

## 2020-06-11 NOTE — Patient Instructions (Addendum)
Medication Instructions:  DECREASE the Metoprolol Tartrate to 12.5 mg twice daily (half of a 25 mg tablet)  *If you need a refill on your cardiac medications before your next appointment, please call your pharmacy*   Lab Work: None ordered If you have labs (blood work) drawn today and your tests are completely normal, you will receive your results only by: Marland Kitchen MyChart Message (if you have MyChart) OR . A paper copy in the mail If you have any lab test that is abnormal or we need to change your treatment, we will call you to review the results.   Testing/Procedures: None ordered   Follow-Up: At [facility], you and your health needs are our priority.  As part of our continuing mission to provide you with exceptional heart care, we have created designated Provider Care Teams.  These Care Teams include your primary Cardiologist (physician) and Advanced Practice Providers (APPs -  Physician Assistants and Nurse Practitioners) who all work together to provide you with the care you need, when you need it.  We recommend signing up for the patient portal called "MyChart".  Sign up information is provided on this After Visit Summary.  MyChart is used to connect with patients for Virtual Visits (Telemedicine).  Patients are able to view lab/test results, encounter notes, upcoming appointments, etc.  Non-urgent messages can be sent to your provider as well.   To learn more about what you can do with MyChart, go to [email or web address].    Your next appointment:   12 month(s)  The format for your next appointment:   In Person  Provider:   Sanda Klein, MD

## 2020-06-12 ENCOUNTER — Telehealth: Payer: Self-pay | Admitting: Cardiovascular Disease

## 2020-06-12 ENCOUNTER — Encounter: Payer: Self-pay | Admitting: Cardiovascular Disease

## 2020-06-12 NOTE — Telephone Encounter (Signed)
Left a message for the patient's daughter to call back. The patient has given permission to speak with her.

## 2020-06-12 NOTE — Telephone Encounter (Signed)
Patient daughter she has questions about yesterday's visit. 1. His A-Fib she wants to know what signs to look for?  2. Also Dr. Loletha Grayer stated this might remedy itself, she wants to know long it would take for that to happen?

## 2020-06-13 NOTE — Telephone Encounter (Signed)
Daughter returned call.

## 2020-06-13 NOTE — Telephone Encounter (Signed)
Spoke to patient's daughter'sTana.She stated she was not able to come to father's appointment with Dr.Croitoru this past Monday.She wanted to know if father was able to have a cardioversion.Dr.Croitoru advised father not a candidate for a cardioversion.Daughter reassured.Advised to make sure his heart rate stays under control and he takes medications as prescribed.Stated they check his pulse daily with a pulse oximetry.Advised to call back if she has any more questions.

## 2020-06-13 NOTE — Telephone Encounter (Signed)
LMTCB

## 2020-09-05 ENCOUNTER — Ambulatory Visit (INDEPENDENT_AMBULATORY_CARE_PROVIDER_SITE_OTHER)

## 2020-09-05 DIAGNOSIS — I442 Atrioventricular block, complete: Secondary | ICD-10-CM

## 2020-09-07 LAB — CUP PACEART REMOTE DEVICE CHECK
Battery Remaining Longevity: 12 mo
Battery Remaining Percentage: 22 %
Battery Voltage: 2.78 V
Brady Statistic AP VP Percent: 91 %
Brady Statistic AP VS Percent: 1 %
Brady Statistic AS VP Percent: 9.2 %
Brady Statistic AS VS Percent: 1 %
Brady Statistic RA Percent Paced: 72 %
Brady Statistic RV Percent Paced: 99 %
Date Time Interrogation Session: 20211117054111
Implantable Lead Implant Date: 19930618
Implantable Lead Implant Date: 19930618
Implantable Lead Location: 753859
Implantable Lead Location: 753860
Implantable Lead Serial Number: 23032978
Implantable Pulse Generator Implant Date: 20140516
Lead Channel Impedance Value: 430 Ohm
Lead Channel Impedance Value: 460 Ohm
Lead Channel Pacing Threshold Amplitude: 0.5 V
Lead Channel Pacing Threshold Amplitude: 0.75 V
Lead Channel Pacing Threshold Pulse Width: 0.5 ms
Lead Channel Pacing Threshold Pulse Width: 0.5 ms
Lead Channel Sensing Intrinsic Amplitude: 0.5 mV
Lead Channel Sensing Intrinsic Amplitude: 7.2 mV
Lead Channel Setting Pacing Amplitude: 1 V
Lead Channel Setting Pacing Amplitude: 5 V
Lead Channel Setting Pacing Pulse Width: 0.5 ms
Lead Channel Setting Sensing Sensitivity: 6 mV
Pulse Gen Model: 2210
Pulse Gen Serial Number: 7474476

## 2020-09-07 NOTE — Progress Notes (Signed)
Remote pacemaker transmission.   

## 2020-09-12 DIAGNOSIS — E1122 Type 2 diabetes mellitus with diabetic chronic kidney disease: Secondary | ICD-10-CM | POA: Diagnosis not present

## 2020-09-12 DIAGNOSIS — Z960 Presence of urogenital implants: Secondary | ICD-10-CM | POA: Diagnosis not present

## 2020-09-12 DIAGNOSIS — I499 Cardiac arrhythmia, unspecified: Secondary | ICD-10-CM | POA: Diagnosis not present

## 2020-09-12 DIAGNOSIS — H9190 Unspecified hearing loss, unspecified ear: Secondary | ICD-10-CM | POA: Diagnosis not present

## 2020-09-12 DIAGNOSIS — Z8616 Personal history of COVID-19: Secondary | ICD-10-CM | POA: Diagnosis not present

## 2020-09-12 DIAGNOSIS — Z125 Encounter for screening for malignant neoplasm of prostate: Secondary | ICD-10-CM | POA: Diagnosis not present

## 2020-09-12 DIAGNOSIS — E785 Hyperlipidemia, unspecified: Secondary | ICD-10-CM | POA: Diagnosis not present

## 2020-09-12 DIAGNOSIS — E1169 Type 2 diabetes mellitus with other specified complication: Secondary | ICD-10-CM | POA: Diagnosis not present

## 2020-09-12 DIAGNOSIS — Z1331 Encounter for screening for depression: Secondary | ICD-10-CM | POA: Diagnosis not present

## 2020-09-12 DIAGNOSIS — I10 Essential (primary) hypertension: Secondary | ICD-10-CM | POA: Diagnosis not present

## 2020-09-12 DIAGNOSIS — Z978 Presence of other specified devices: Secondary | ICD-10-CM | POA: Diagnosis not present

## 2020-09-12 DIAGNOSIS — Z950 Presence of cardiac pacemaker: Secondary | ICD-10-CM | POA: Diagnosis not present

## 2020-09-12 DIAGNOSIS — N182 Chronic kidney disease, stage 2 (mild): Secondary | ICD-10-CM | POA: Diagnosis not present

## 2020-09-12 DIAGNOSIS — Z9181 History of falling: Secondary | ICD-10-CM | POA: Diagnosis not present

## 2020-12-05 ENCOUNTER — Telehealth: Payer: Self-pay

## 2020-12-05 ENCOUNTER — Ambulatory Visit (INDEPENDENT_AMBULATORY_CARE_PROVIDER_SITE_OTHER)

## 2020-12-05 DIAGNOSIS — I480 Paroxysmal atrial fibrillation: Secondary | ICD-10-CM | POA: Diagnosis not present

## 2020-12-05 LAB — CUP PACEART REMOTE DEVICE CHECK
Battery Remaining Longevity: 9 mo
Battery Remaining Percentage: 15 %
Battery Voltage: 2.74 V
Brady Statistic AP VP Percent: 91 %
Brady Statistic AP VS Percent: 1 %
Brady Statistic AS VP Percent: 9.2 %
Brady Statistic AS VS Percent: 1 %
Brady Statistic RA Percent Paced: 65 %
Brady Statistic RV Percent Paced: 99 %
Date Time Interrogation Session: 20220216021810
Implantable Lead Implant Date: 19930618
Implantable Lead Implant Date: 19930618
Implantable Lead Location: 753859
Implantable Lead Location: 753860
Implantable Lead Serial Number: 23032978
Implantable Pulse Generator Implant Date: 20140516
Lead Channel Impedance Value: 410 Ohm
Lead Channel Impedance Value: 430 Ohm
Lead Channel Pacing Threshold Amplitude: 0.5 V
Lead Channel Pacing Threshold Amplitude: 0.625 V
Lead Channel Pacing Threshold Pulse Width: 0.5 ms
Lead Channel Pacing Threshold Pulse Width: 0.5 ms
Lead Channel Sensing Intrinsic Amplitude: 0.5 mV
Lead Channel Sensing Intrinsic Amplitude: 7.2 mV
Lead Channel Setting Pacing Amplitude: 0.875
Lead Channel Setting Pacing Amplitude: 5 V
Lead Channel Setting Pacing Pulse Width: 0.5 ms
Lead Channel Setting Sensing Sensitivity: 6 mV
Pulse Gen Model: 2210
Pulse Gen Serial Number: 7474476

## 2020-12-05 NOTE — Telephone Encounter (Signed)
Spoke with pt spouse Ruby, advised of increased remote monitoring due to battery nearing ERI.  Next scheduled check 01/02/21.

## 2020-12-12 NOTE — Progress Notes (Signed)
Remote pacemaker transmission.   

## 2020-12-18 DEATH — deceased

## 2021-08-11 IMAGING — DX DG CHEST 1V PORT
1 series · 1 of 1 positions shown · non-contrast
Comparison: Single view of the chest 09/25/2019.

CLINICAL DATA: [id number] pneumonia.

EXAM:
PORTABLE CHEST 1 VIEW

[chest]
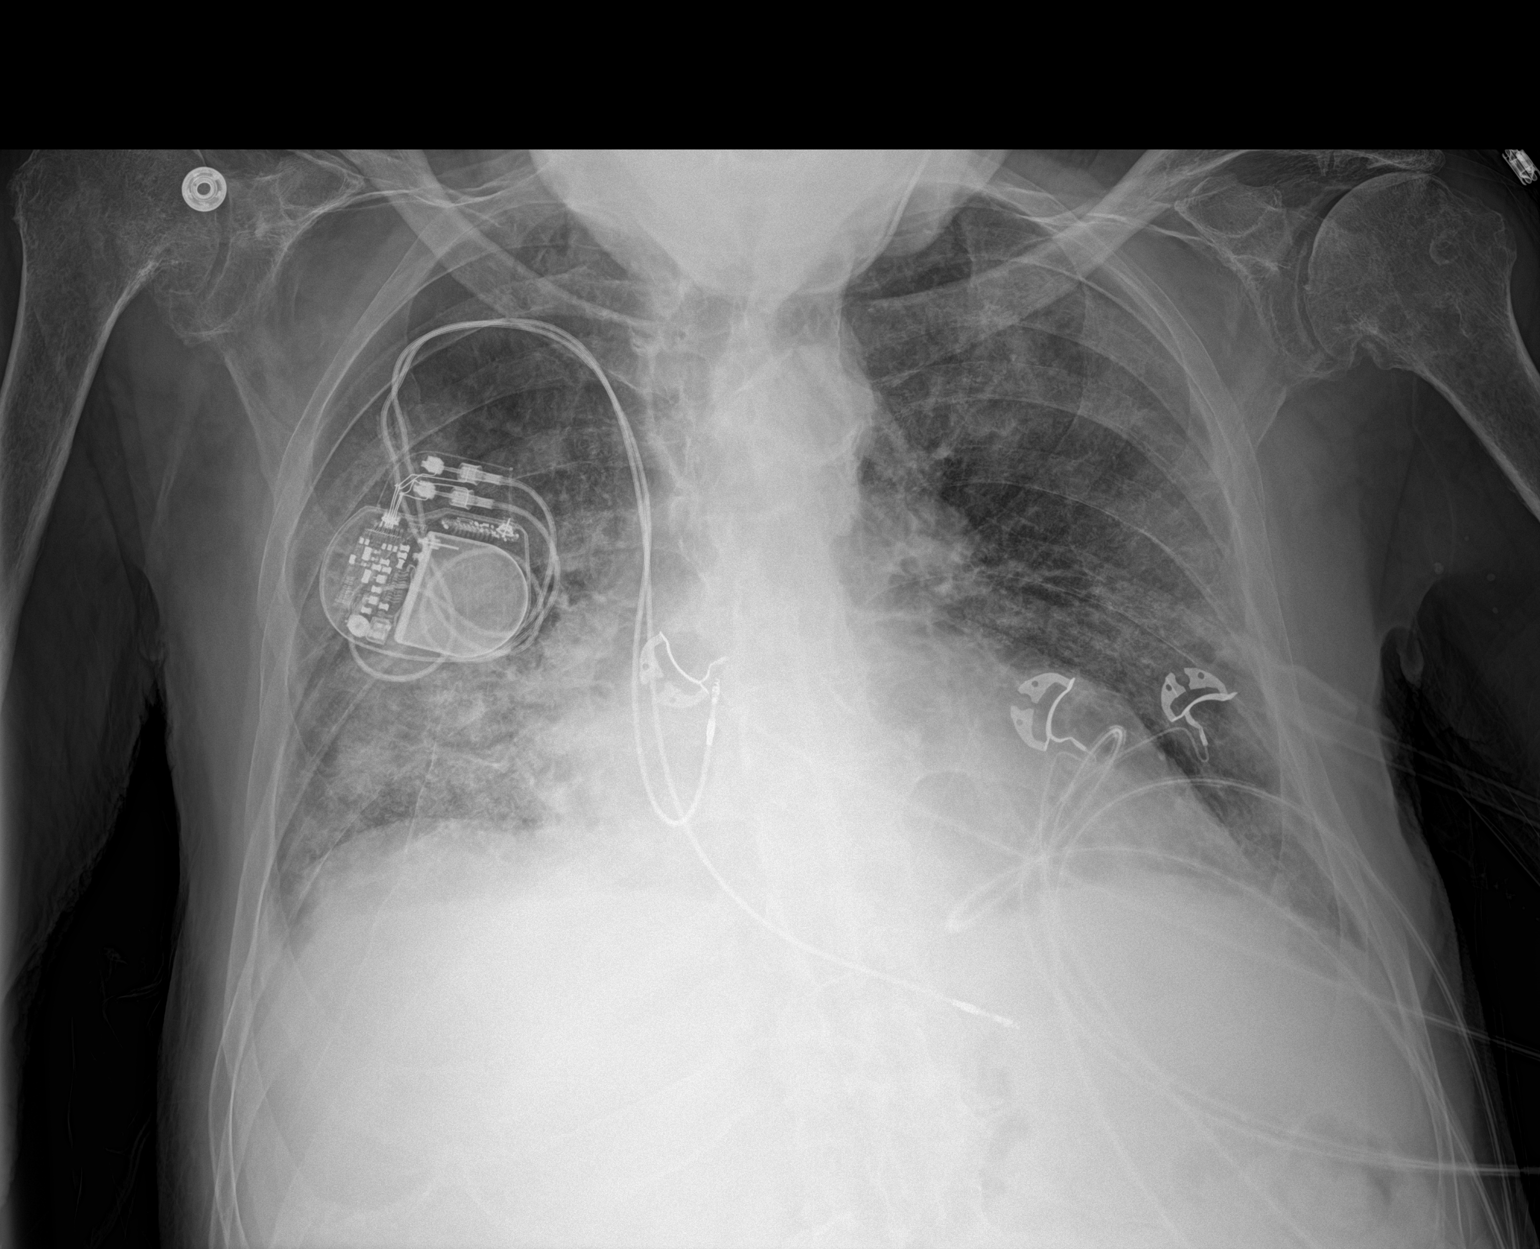

[1 of 1 positions shown; findings below may reference images not displayed]

FINDINGS: Right worse than left airspace disease persists. Aeration in the
lower lung zones has improved compared to the prior study. No
pneumothorax or pleural effusion. Heart size is upper normal.
Atherosclerosis and pacing device noted.
IMPRESSION: Right worse than left airspace disease persists but has improved.

## 2021-08-18 IMAGING — US US ABDOMEN COMPLETE
1 series · 14 of 25 positions shown · non-contrast
Comparison: CT chest 03/03/2013.

CLINICAL DATA: Elevated liver function tests.  Hyperbilirubinemia.

EXAM:
ABDOMEN ULTRASOUND COMPLETE

[Series 1: us abdomen complete · 0.19mm/px · 14 of 72 slices shown]
[im 1/72]
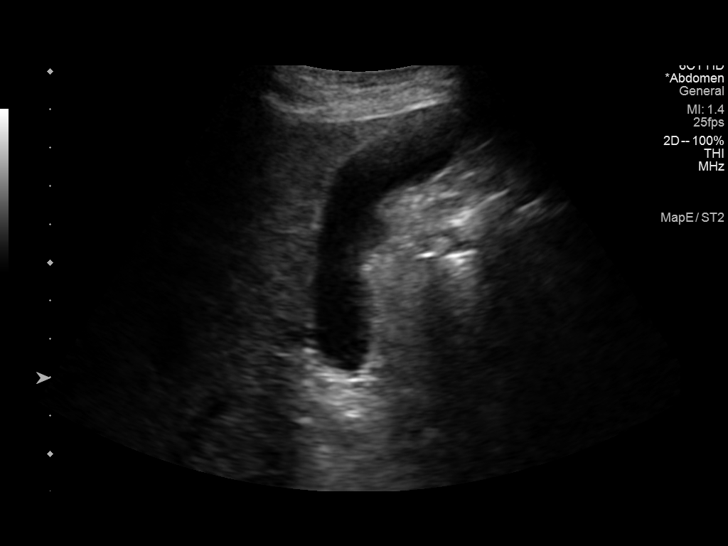
[im 6/72]
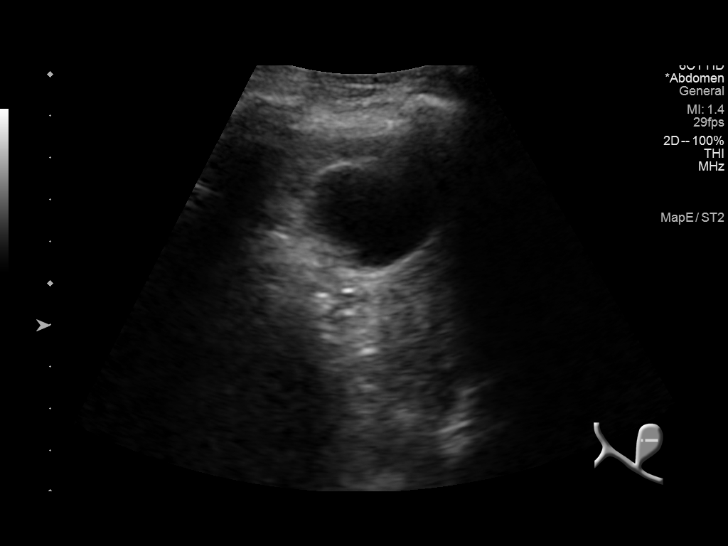
[im 12/72]
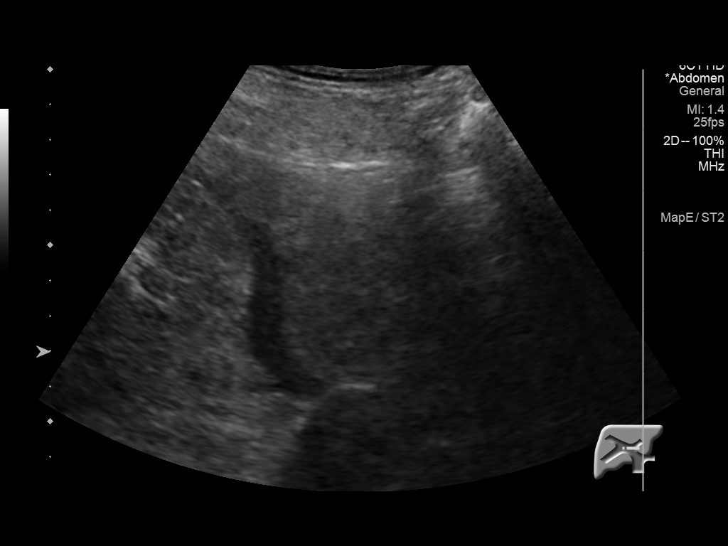
[im 18/72]
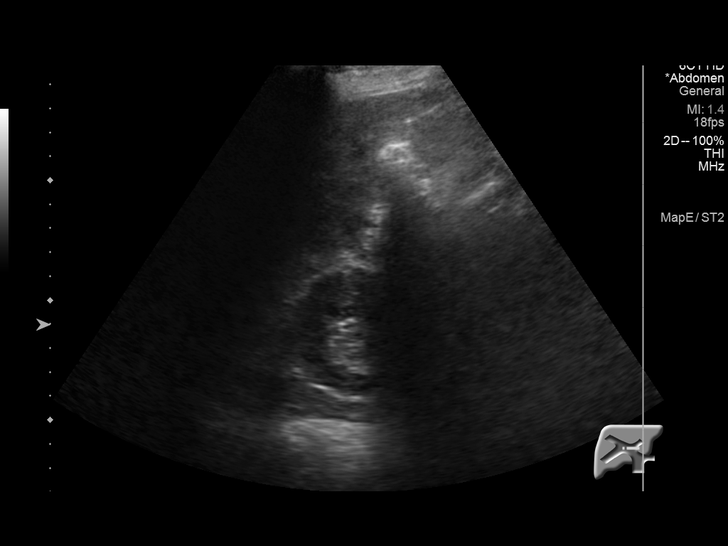
[im 24/72]
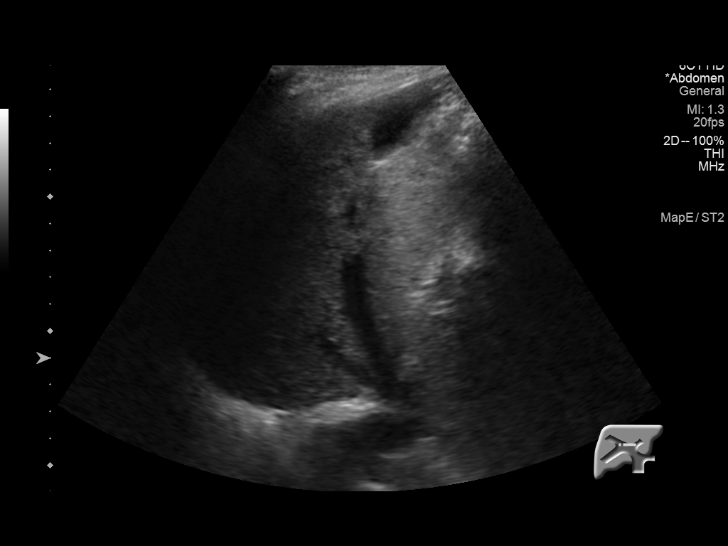
[im 27/72]
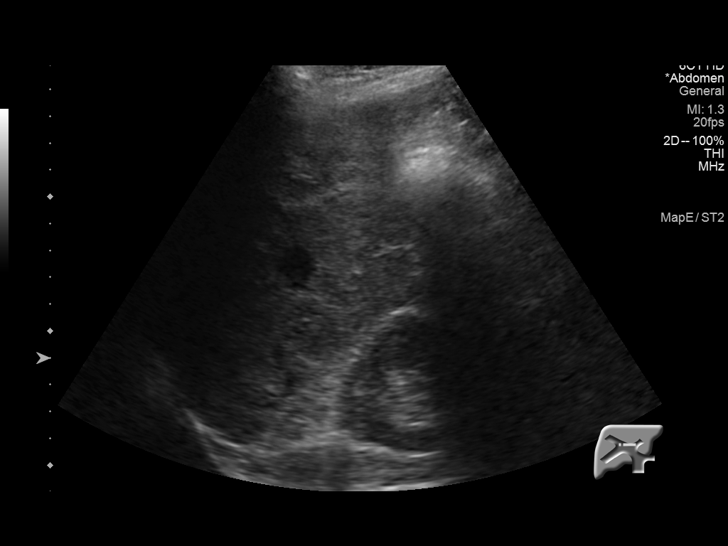
[im 33/72]
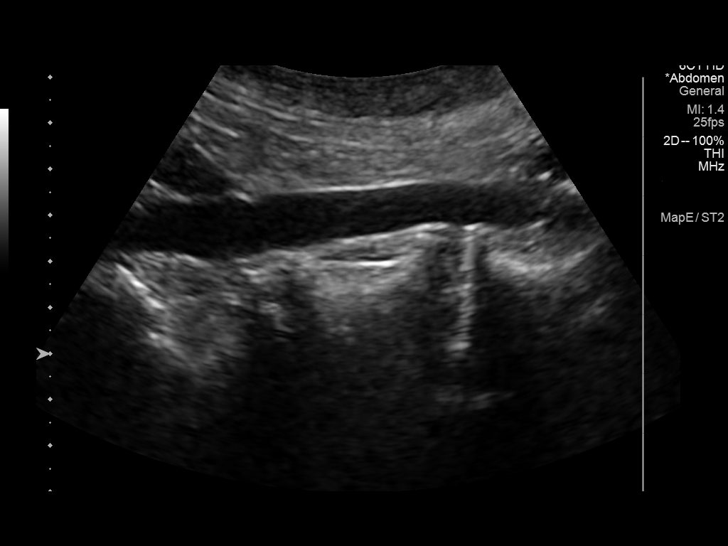
[im 39/72]
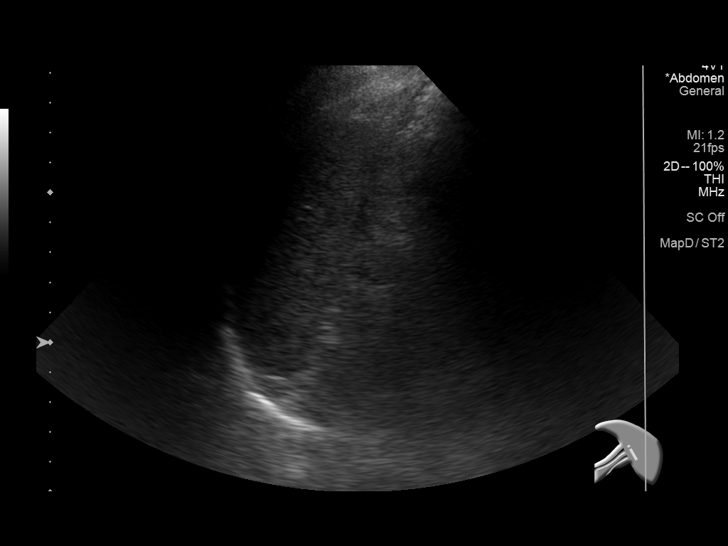
[im 45/72]
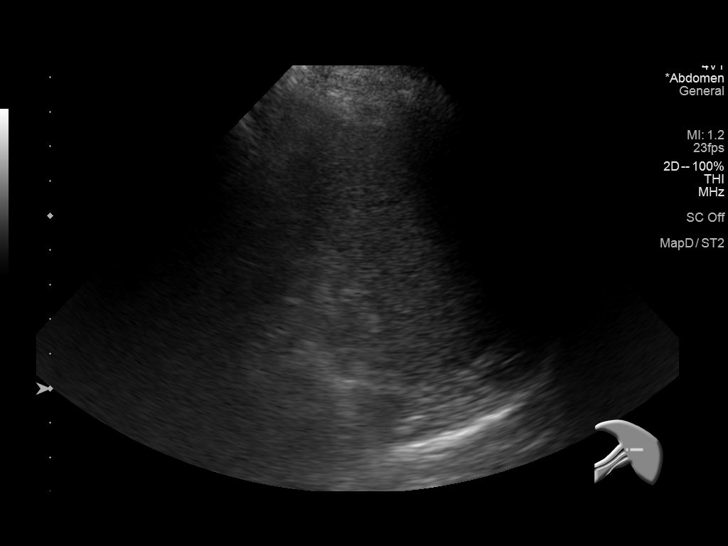
[im 48/72]
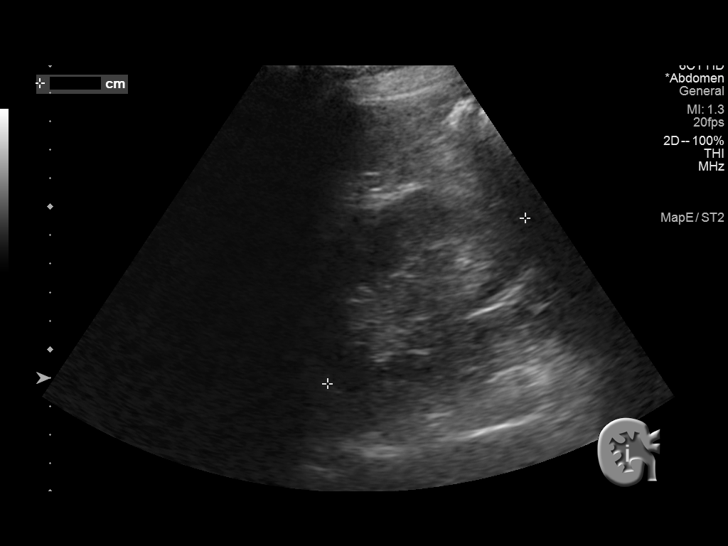
[im 54/72]
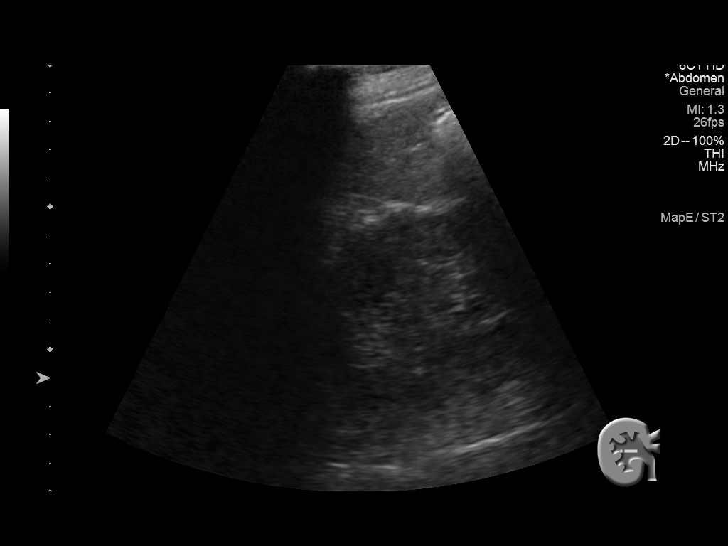
[im 60/72]
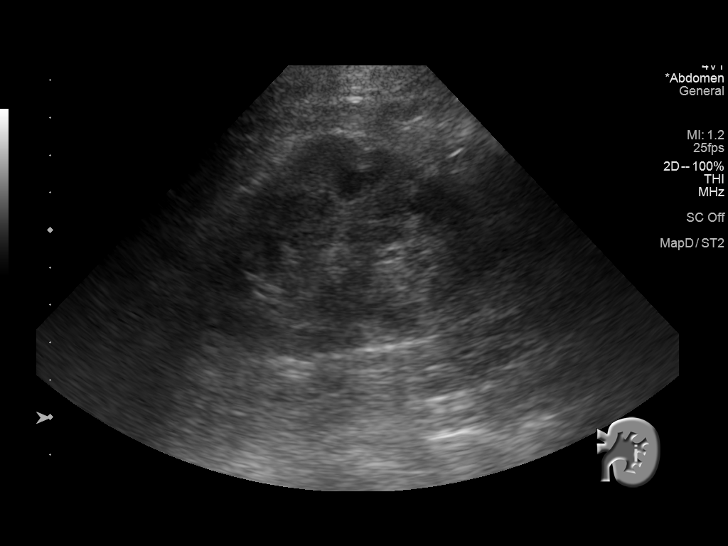
[im 66/72]
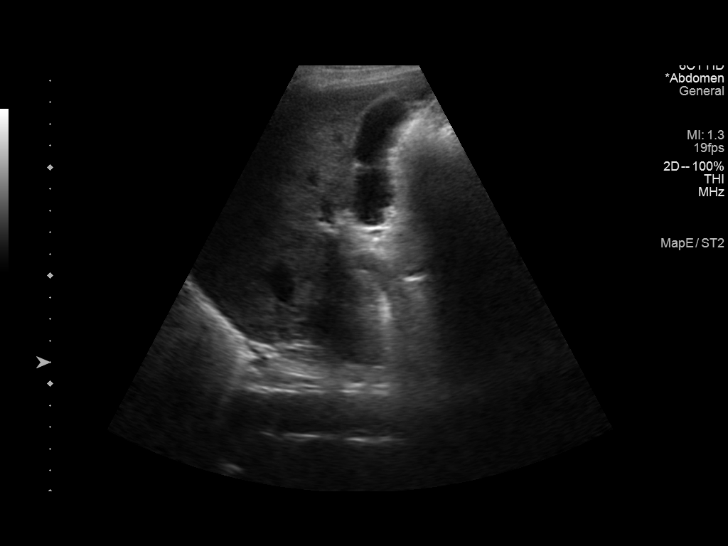
[im 72/72]
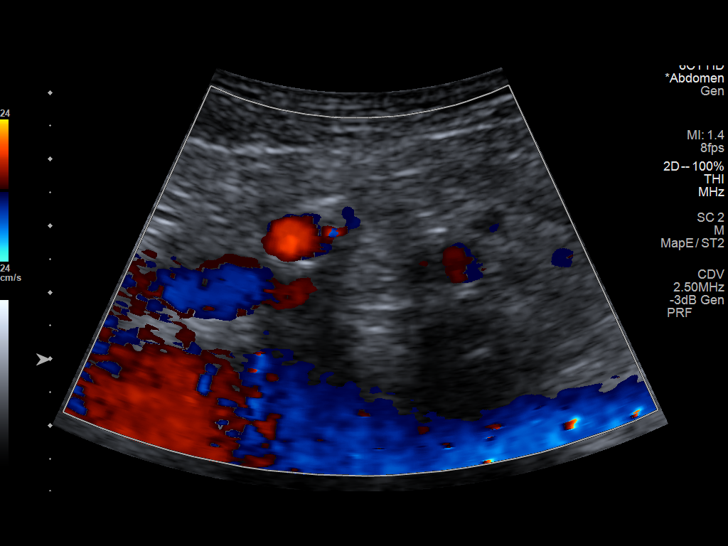

[14 of 25 positions shown; findings below may reference images not displayed]

FINDINGS: Gallbladder: No gallstones or wall thickening visualized. No
sonographic Murphy sign noted by sonographer.

Common bile duct: Diameter: 0.4 cm

Liver: No focal lesion. The liver appears heterogeneous with
increased echogenicity. Portal vein is patent on color Doppler
imaging with normal direction of blood flow towards the liver.

IVC: No abnormality visualized.

Pancreas: Visualized portion unremarkable.

Spleen: Size and appearance within normal limits.

Right Kidney: Length: 9.0. Echogenicity within normal limits. No
mass or hydronephrosis visualized.

Left Kidney: Length: 9.3 cm. Echogenicity within normal limits. No
mass or hydronephrosis visualized.

Abdominal aorta: No aneurysm visualized.

Other findings: Small right pleural effusion is seen.
IMPRESSION: Fatty infiltration of the liver.

Negative for biliary dilatation.

Small right pleural effusion.

## 2021-08-23 IMAGING — DX DG CHEST 1V PORT
1 series · 1 of 1 positions shown · non-contrast
Comparison: 09/29/2019.

CLINICAL DATA: Fever.  Hypoxia.  History of [id number].

EXAM:
PORTABLE CHEST 1 VIEW

[chest ap]
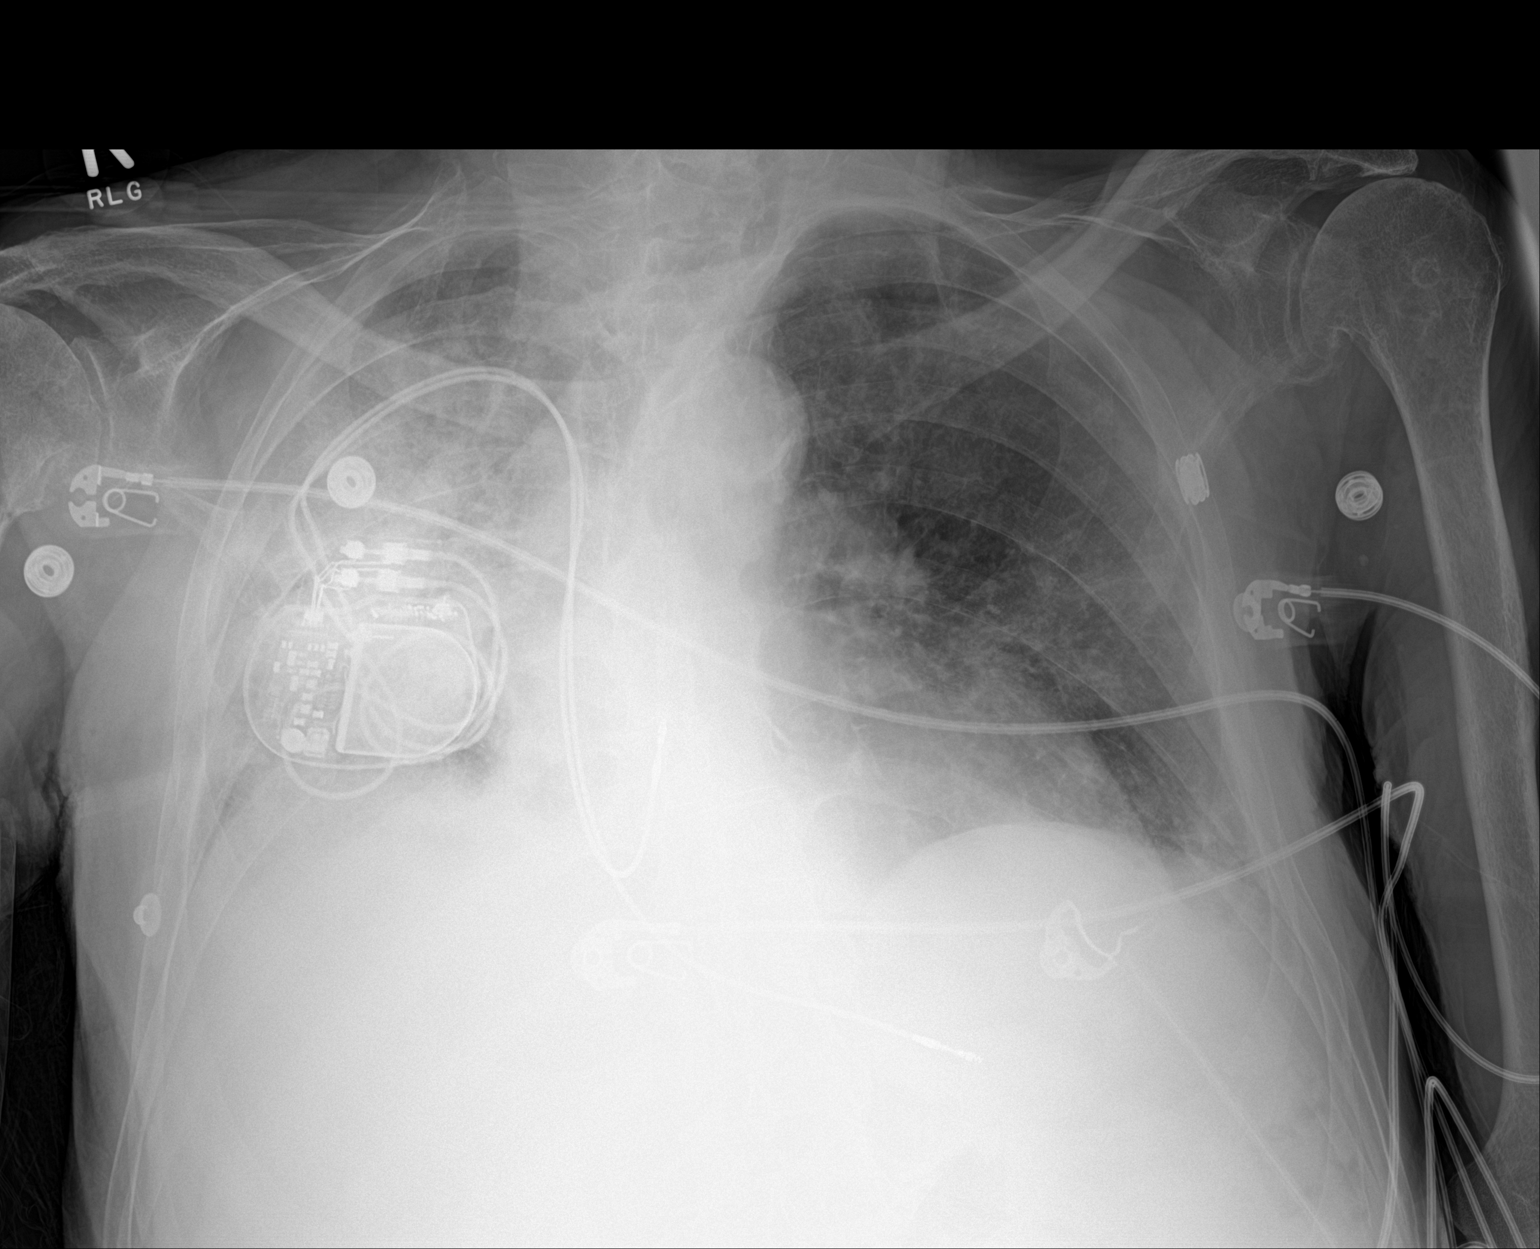

[1 of 1 positions shown; findings below may reference images not displayed]

FINDINGS: Cardiac pacer with lead tips over the right atrium right ventricle.
Stable cardiomegaly. Diffuse bilateral pulmonary infiltrates, right
side greater than left. Diffuse right lung infiltrates progressed
from prior exam. Low lung volumes particularly on the right. No
pleural effusion or pneumothorax.
IMPRESSION: 1.  Cardiac pacer noted stable position.  Stable cardiomegaly.

2. Diffuse bilateral pulmonary infiltrates, right side greater than
left. Diffuse right lung has progressed from prior exam. Low lung
volumes, particularly on the right.

## 2021-08-24 IMAGING — DX DG CHEST 1V PORT
1 series · 1 of 1 positions shown · non-contrast
Comparison: 10/11/2019

CLINICAL DATA: Shortness of breath

EXAM:
PORTABLE CHEST 1 VIEW

[chest]
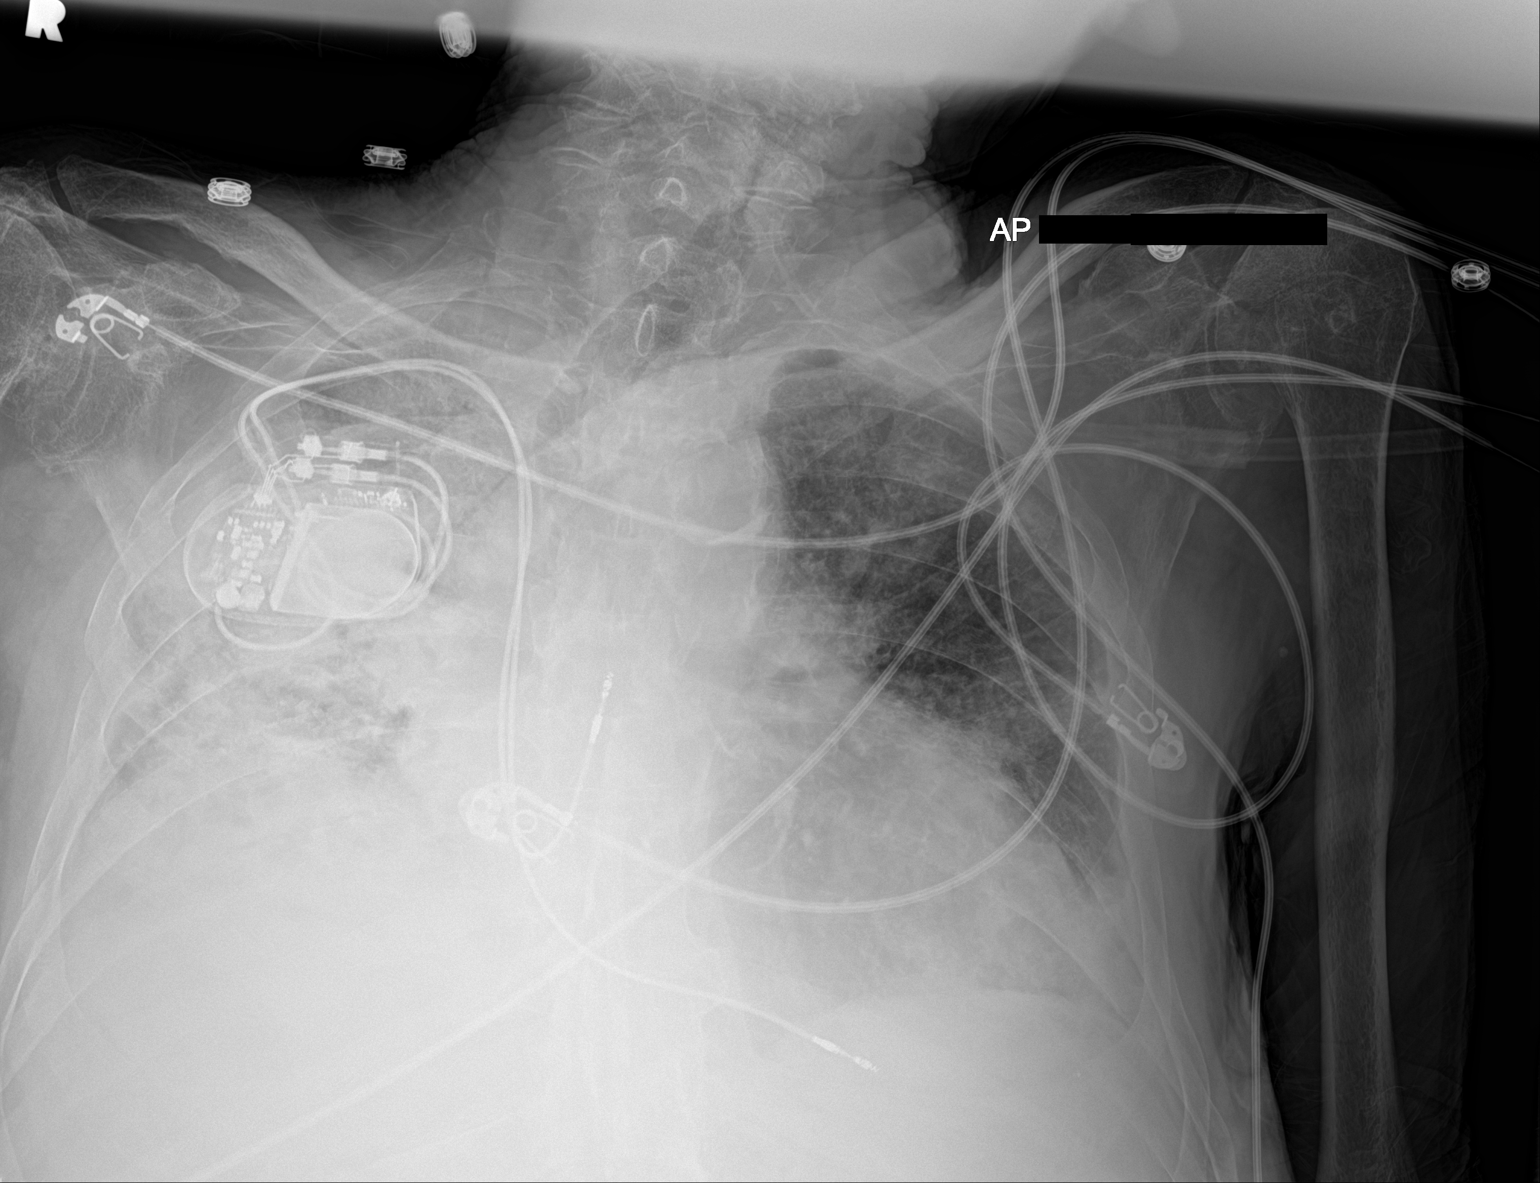

[1 of 1 positions shown; findings below may reference images not displayed]

FINDINGS: Right pacer remains in place, unchanged. Cardiomegaly. Diffuse
bilateral airspace disease, right greater than left. No visible
significant effusions. No acute bony abnormality.
IMPRESSION: Stable diffuse bilateral airspace disease, right greater than left.
